# Patient Record
Sex: Female | Born: 1985 | Race: White | Hispanic: No | Marital: Married | Smoking: Never smoker
Health system: Southern US, Community
[De-identification: ages and names within clinical notes are randomized; demographics above are authoritative.]

## PROBLEM LIST (undated history)

## (undated) ENCOUNTER — Inpatient Hospital Stay (HOSPITAL_COMMUNITY): Payer: Self-pay

## (undated) DIAGNOSIS — N809 Endometriosis, unspecified: Secondary | ICD-10-CM

## (undated) DIAGNOSIS — N83209 Unspecified ovarian cyst, unspecified side: Secondary | ICD-10-CM

## (undated) DIAGNOSIS — O24419 Gestational diabetes mellitus in pregnancy, unspecified control: Secondary | ICD-10-CM

## (undated) DIAGNOSIS — F32A Depression, unspecified: Secondary | ICD-10-CM

## (undated) DIAGNOSIS — F329 Major depressive disorder, single episode, unspecified: Secondary | ICD-10-CM

## (undated) DIAGNOSIS — Z8632 Personal history of gestational diabetes: Secondary | ICD-10-CM

## (undated) HISTORY — DX: Gestational diabetes mellitus in pregnancy, unspecified control: O24.419

## (undated) HISTORY — PX: OVARIAN CYST SURGERY: SHX726

## (undated) HISTORY — DX: Unspecified ovarian cyst, unspecified side: N83.209

## (undated) HISTORY — PX: WISDOM TOOTH EXTRACTION: SHX21

---

## 2013-08-03 DIAGNOSIS — M797 Fibromyalgia: Secondary | ICD-10-CM | POA: Insufficient documentation

## 2014-09-15 DIAGNOSIS — F909 Attention-deficit hyperactivity disorder, unspecified type: Secondary | ICD-10-CM | POA: Insufficient documentation

## 2014-09-15 DIAGNOSIS — N979 Female infertility, unspecified: Secondary | ICD-10-CM | POA: Insufficient documentation

## 2017-05-02 ENCOUNTER — Encounter (HOSPITAL_COMMUNITY): Payer: Self-pay | Admitting: *Deleted

## 2017-05-02 ENCOUNTER — Ambulatory Visit (HOSPITAL_COMMUNITY)
Admission: EM | Admit: 2017-05-02 | Discharge: 2017-05-02 | Disposition: A | Discharge status: Home / Self Care | Payer: Medicaid Other | Attending: Family Medicine | Admitting: Family Medicine

## 2017-05-02 DIAGNOSIS — B3731 Acute candidiasis of vulva and vagina: Secondary | ICD-10-CM

## 2017-05-02 DIAGNOSIS — L298 Other pruritus: Secondary | ICD-10-CM | POA: Diagnosis present

## 2017-05-02 DIAGNOSIS — Z79899 Other long term (current) drug therapy: Secondary | ICD-10-CM | POA: Insufficient documentation

## 2017-05-02 DIAGNOSIS — B373 Candidiasis of vulva and vagina: Secondary | ICD-10-CM

## 2017-05-02 MED ORDER — FLUCONAZOLE 150 MG PO TABS: 150.0000 mg | ORAL_TABLET | Freq: Once | ORAL | 0 refills | Status: AC

## 2017-05-02 NOTE — ED Triage Notes (Signed)
Possible  Yeast  Infection   Irritation   And   Discharge   Symptoms      X   3  Days

## 2017-05-02 NOTE — ED Provider Notes (Addendum)
[place]    CSN: [id number] Arrival date & time: 05/02/17  1422     History   Chief Complaint Chief Complaint  Patient presents with  . Vaginal Itching    HPI Tracy Gallegos is a 31 y.o. female.   Possible  Yeast  Infection   Irritation   And   Discharge   Symptoms      X   3  Days .  She is married and has one child. She describes the discharge as white lumpy and associated with large amount of itchiness. She tried some of her daughter's diaper rash cream which did not help.      History reviewed. No pertinent past medical history.  There are no active problems to display for this patient.   Past Surgical History:  Procedure Laterality Date  . OVARIAN CYST SURGERY    . WISDOM TOOTH EXTRACTION      OB History    No data available       Home Medications    Prior to Admission medications   Medication Sig Start Date End Date Taking? Authorizing Provider  amphetamine-dextroamphetamine (ADDERALL) 5 MG tablet Take 5 mg by mouth daily.   Yes [provider]  FLUoxetine (PROZAC) 40 MG capsule Take 40 mg by mouth daily.   Yes [provider]  Prenatal Vit-Fe Fumarate-FA (PRENATAL MULTIVITAMIN) TABS tablet Take 1 tablet by mouth daily at 12 noon.   Yes [provider]  fluconazole (DIFLUCAN) 150 MG tablet Take 1 tablet (150 mg total) by mouth once. Repeat if needed 05/02/17 05/02/17  Robyn Haber, MD    Family History History reviewed. No pertinent family history.  Social History Social History  Substance Use Topics  . Smoking status: Never Smoker  . Smokeless tobacco: Never Used  . Alcohol use No     Allergies   Patient has no known allergies.   Review of Systems Review of Systems  Genitourinary: Positive for vaginal discharge.  All other systems reviewed and are negative.    Physical Exam Triage Vital Signs ED Triage Vitals [05/02/17 1440]  Enc Vitals Group     BP 101/67     Pulse Rate 76     Resp 18       Temp 98.9 F (37.2 C)     Temp Source Oral     SpO2 100 %     Weight      Height      Head Circumference      Peak Flow      Pain Score      Pain Loc      Pain Edu?      Excl. in August?    No data found.   Updated Vital Signs BP 101/67 (BP Location: Right Arm)   Pulse 76   Temp 98.9 F (37.2 C) (Oral)   Resp 18   LMP 04/16/2017 Comment: breast  feeding  SpO2 100%    Physical Exam  Constitutional: She is oriented to person, place, and time. She appears well-developed and well-nourished.  HENT:  Right Ear: External ear normal.  Left Ear: External ear normal.  Eyes: Conjunctivae are normal. Pupils are equal, round, and reactive to light.  Neck: Normal range of motion. Neck supple.  Pulmonary/Chest: Effort normal.  Musculoskeletal: Normal range of motion.  Neurological: She is alert and oriented to person, place, and time.  Skin: Skin is warm and dry.  Nursing note and vitals reviewed.  UC Treatments / Results  Labs (all labs ordered are listed, but only abnormal results are displayed) Labs Reviewed  URINE CYTOLOGY ANCILLARY ONLY    EKG  EKG Interpretation None       Radiology No results found.  Procedures Procedures (including critical care time)  Medications Ordered in UC Medications - No data to display   Initial Impression / Assessment and Plan / UC Course  I have reviewed the triage vital signs and the nursing notes.  Pertinent labs & imaging results that were available during my care of the patient were reviewed by me and considered in my medical decision making (see chart for details).     Final Clinical Impressions(s) / UC Diagnoses   Final diagnoses:  Monilial vulvovaginitis    New Prescriptions New Prescriptions   FLUCONAZOLE (DIFLUCAN) 150 MG TABLET    Take 1 tablet (150 mg total) by mouth once. Repeat if needed     Robyn Haber, MD 05/02/17 [place], Rejina Odle, MD 05/02/17 1505

## 2017-05-05 LAB — URINE CYTOLOGY ANCILLARY ONLY
Chlamydia: NEGATIVE
Neisseria Gonorrhea: NEGATIVE
Trichomonas: NEGATIVE

## 2017-05-07 LAB — URINE CYTOLOGY ANCILLARY ONLY
Bacterial vaginitis: NEGATIVE
Candida vaginitis: NEGATIVE

## 2017-08-27 ENCOUNTER — Ambulatory Visit (INDEPENDENT_AMBULATORY_CARE_PROVIDER_SITE_OTHER): Payer: 59

## 2017-08-27 DIAGNOSIS — N912 Amenorrhea, unspecified: Secondary | ICD-10-CM

## 2017-08-27 DIAGNOSIS — Z3201 Encounter for pregnancy test, result positive: Secondary | ICD-10-CM | POA: Diagnosis not present

## 2017-08-27 LAB — POCT URINE PREGNANCY: PREG TEST UR: POSITIVE — AB

## 2017-08-27 NOTE — Progress Notes (Addendum)
Patient is in the office for UPT, results positive. LMP 07-03-17, EDD 04-09-18, [place] [redacted]w[redacted]d.    I have reviewed this chart and agree with the RN assessment and management.    Feliz Beam, M.D. Attending [place], [facility] for [employer], [place]

## 2017-08-27 NOTE — Progress Notes (Signed)
Pt here for a pregnancy test.

## 2017-08-29 ENCOUNTER — Encounter (HOSPITAL_COMMUNITY): Payer: Self-pay

## 2017-08-29 ENCOUNTER — Inpatient Hospital Stay (HOSPITAL_COMMUNITY): Payer: Medicaid Other

## 2017-08-29 ENCOUNTER — Inpatient Hospital Stay (HOSPITAL_COMMUNITY)
Admission: AD | Admit: 2017-08-29 | Discharge: 2017-08-29 | Disposition: A | Discharge status: Home / Self Care | Payer: Medicaid Other | Source: Ambulatory Visit | Attending: Obstetrics and Gynecology | Admitting: Obstetrics and Gynecology

## 2017-08-29 DIAGNOSIS — O210 Mild hyperemesis gravidarum: Secondary | ICD-10-CM | POA: Diagnosis not present

## 2017-08-29 DIAGNOSIS — Z6791 Unspecified blood type, Rh negative: Secondary | ICD-10-CM | POA: Insufficient documentation

## 2017-08-29 DIAGNOSIS — O468X1 Other antepartum hemorrhage, first trimester: Secondary | ICD-10-CM | POA: Diagnosis not present

## 2017-08-29 DIAGNOSIS — O209 Hemorrhage in early pregnancy, unspecified: Secondary | ICD-10-CM

## 2017-08-29 DIAGNOSIS — O26891 Other specified pregnancy related conditions, first trimester: Secondary | ICD-10-CM | POA: Diagnosis not present

## 2017-08-29 DIAGNOSIS — O219 Vomiting of pregnancy, unspecified: Secondary | ICD-10-CM

## 2017-08-29 DIAGNOSIS — O418X10 Other specified disorders of amniotic fluid and membranes, first trimester, not applicable or unspecified: Secondary | ICD-10-CM

## 2017-08-29 DIAGNOSIS — Z3A08 8 weeks gestation of pregnancy: Secondary | ICD-10-CM | POA: Diagnosis not present

## 2017-08-29 DIAGNOSIS — O360120 Maternal care for anti-D [Rh] antibodies, second trimester, not applicable or unspecified: Secondary | ICD-10-CM | POA: Diagnosis not present

## 2017-08-29 DIAGNOSIS — O208 Other hemorrhage in early pregnancy: Secondary | ICD-10-CM | POA: Diagnosis not present

## 2017-08-29 HISTORY — DX: Major depressive disorder, single episode, unspecified: F32.9

## 2017-08-29 HISTORY — DX: Endometriosis, unspecified: N80.9

## 2017-08-29 HISTORY — DX: Depression, unspecified: F32.A

## 2017-08-29 LAB — CBC
HCT: 36.7 % (ref 36.0–46.0)
Hemoglobin: 12.6 g/dL (ref 12.0–15.0)
MCH: 31.1 pg (ref 26.0–34.0)
MCHC: 34.3 g/dL (ref 30.0–36.0)
MCV: 90.6 fL (ref 78.0–100.0)
PLATELETS: 165 10*3/uL (ref 150–400)
RBC: 4.05 MIL/uL (ref 3.87–5.11)
RDW: 12.9 % (ref 11.5–15.5)
WBC: 7.3 10*3/uL (ref 4.0–10.5)

## 2017-08-29 LAB — HCG, QUANTITATIVE, PREGNANCY: hCG, Beta Chain, Quant, S: 104020 m[IU]/mL — ABNORMAL HIGH (ref <5)

## 2017-08-29 LAB — ABO/RH: ABO/RH(D): O NEG

## 2017-08-29 MED ORDER — ONDANSETRON 4 MG PO TBDP: 4.0000 mg | ORAL_TABLET | Freq: Three times a day (TID) | ORAL | 0 refills | Status: DC | PRN

## 2017-08-29 MED ORDER — RHO D IMMUNE GLOBULIN 1500 UNIT/2ML IJ SOSY
300.0000 ug | PREFILLED_SYRINGE | Freq: Once | INTRAMUSCULAR | Status: AC
  Administered 2017-08-29: 300 ug via INTRAMUSCULAR
  Filled 2017-08-29: qty 2

## 2017-08-29 MED ORDER — METOCLOPRAMIDE HCL 10 MG PO TABS: 10.0000 mg | ORAL_TABLET | Freq: Three times a day (TID) | ORAL | 0 refills | Status: DC

## 2017-08-29 NOTE — MAU Note (Signed)
Pt started bleeding about 2 hours about. Large amounts of red blood, no clots or tissue. Pt is bleeding heavy and it has run on the floor and on the bed while she was changing

## 2017-08-29 NOTE — MAU Provider Note (Signed)
History     CSN: [id number]  Arrival date and time: 08/29/17 1123   First Provider Initiated Contact with Patient 08/29/17 1137      Chief Complaint  Patient presents with  . Vaginal Bleeding  . Abdominal Pain   HPI   Ms.Tracy Gallegos is a 31 y.o. female G2P1001 @ [redacted]w[redacted]d here in MAU with complaints of acute onset of bright red vaginal bleeding. States she started bleeding heavily 2 hours prior to arrival to MAU. States she is also having abdominal cramping that started around the same time as the bleeding. She has not taken anything for the pain and declines needing anything for the pain at this time.   OB History    Gravida Para Term Preterm AB Living   2 1 1     1    SAB TAB Ectopic Multiple Live Births           1      Past Medical History:  Diagnosis Date  . Depression   . Endometriosis     Past Surgical History:  Procedure Laterality Date  . OVARIAN CYST SURGERY    . WISDOM TOOTH EXTRACTION      History reviewed. No pertinent family history.  Social History  Substance Use Topics  . Smoking status: Never Smoker  . Smokeless tobacco: Never Used  . Alcohol use No    Allergies: No Known Allergies  Prescriptions Prior to Admission  Medication Sig Dispense Refill Last Dose  . amphetamine-dextroamphetamine (ADDERALL) 5 MG tablet Take 5 mg by mouth daily.   Not Taking  . Doxylamine-Pyridoxine 10-10 MG TBEC Take by mouth.   Taking  . FLUoxetine (PROZAC) 40 MG capsule Take 40 mg by mouth daily.   Taking  . Prenatal Vit-Fe Fumarate-FA (PRENATAL MULTIVITAMIN) TABS tablet Take 1 tablet by mouth daily at 12 noon.   Taking   Results for orders placed or performed during the hospital encounter of 08/29/17 (from the past 48 hour(s))  CBC     Status: None   Collection Time: 08/29/17 11:44 AM  Result Value Ref Range   WBC 7.3 4.0 - 10.5 K/uL   RBC 4.05 3.87 - 5.11 MIL/uL   Hemoglobin 12.6 12.0 - 15.0 g/dL   HCT 36.7 36.0 - 46.0 %   MCV 90.6 78.0 - 100.0 fL   MCH  31.1 26.0 - 34.0 pg   MCHC 34.3 30.0 - 36.0 g/dL   RDW 12.9 11.5 - 15.5 %   Platelets 165 150 - 400 K/uL  ABO/Rh     Status: None   Collection Time: 08/29/17 11:44 AM  Result Value Ref Range   ABO/RH(D) O NEG   hCG, quantitative, pregnancy     Status: Abnormal   Collection Time: 08/29/17 11:44 AM  Result Value Ref Range   hCG, Beta Chain, Quant, S 104,020 (H) <5 mIU/mL    Comment:          GEST. AGE      CONC.  (mIU/mL)   <=1 WEEK        5 - 50     2 WEEKS       50 - 500     3 WEEKS       100 - 10,000     4 WEEKS     1,000 - 30,000     5 WEEKS     3,500 - 115,000   6-8 WEEKS     12,000 - 270,000    12 WEEKS  15,000 - 220,000        FEMALE AND NON-PREGNANT FEMALE:     LESS THAN 5 mIU/mL   Rh IG workup (includes ABO/Rh)     Status: None (Preliminary result)   Collection Time: 08/29/17 11:44 AM  Result Value Ref Range   Gestational Age(Wks) 8    ABO/RH(D) O NEG    Antibody Screen NEG    Unit Number [id number]    Blood Component Type RHIG    Unit division 00    Status of Unit ISSUED    Transfusion Status OK TO TRANSFUSE    US Ob Comp Less 14 Wks  Result Date: 08/29/2017 CLINICAL DATA:  Vaginal bleeding in first trimester pregnancy. Gestational age by LMP of 8 weeks 1 day. EXAM: OBSTETRIC <14 WK [place] AND TRANSVAGINAL OB US TECHNIQUE: Both transabdominal and transvaginal ultrasound examinations were performed for complete evaluation of the gestation as well as the maternal uterus, adnexal regions, and pelvic cul-de-sac. Transvaginal technique was performed to assess early pregnancy. COMPARISON:  None. FINDINGS: Intrauterine gestational sac: Single Yolk sac:  Visualized. Embryo:  Visualized. Cardiac Activity: Visualized. Heart Rate: 170  bpm CRL:  17  mm   8 w   0 d                  [place] EDC: 04/10/2018 Subchorionic hemorrhage:  Small subchorionic hemorrhage noted. Maternal uterus/adnexae: Retroverted uterus. 2.9 cm left ovarian corpus luteum cyst. Normal appearance of right ovary.  No abnormal free-fluid. IMPRESSION: Single living IUP measuring 8 weeks 0 days. This is concordant with LMP. Small subchorionic hemorrhage noted. Electronically Signed   By: Earle Gell M.D.   On: 08/29/2017 12:30   US Ob Transvaginal  Result Date: 08/29/2017 CLINICAL DATA:  Vaginal bleeding in first trimester pregnancy. Gestational age by LMP of 8 weeks 1 day. EXAM: OBSTETRIC <14 WK [place] AND TRANSVAGINAL OB US TECHNIQUE: Both transabdominal and transvaginal ultrasound examinations were performed for complete evaluation of the gestation as well as the maternal uterus, adnexal regions, and pelvic cul-de-sac. Transvaginal technique was performed to assess early pregnancy. COMPARISON:  None. FINDINGS: Intrauterine gestational sac: Single Yolk sac:  Visualized. Embryo:  Visualized. Cardiac Activity: Visualized. Heart Rate: 170  bpm CRL:  17  mm   8 w   0 d                  [place] EDC: 04/10/2018 Subchorionic hemorrhage:  Small subchorionic hemorrhage noted. Maternal uterus/adnexae: Retroverted uterus. 2.9 cm left ovarian corpus luteum cyst. Normal appearance of right ovary. No abnormal free-fluid. IMPRESSION: Single living IUP measuring 8 weeks 0 days. This is concordant with LMP. Small subchorionic hemorrhage noted. Electronically Signed   By: Earle Gell M.D.   On: 08/29/2017 12:30   Review of Systems  Constitutional: Negative for fever.  Gastrointestinal: Positive for abdominal pain.  Genitourinary: Positive for vaginal bleeding.   Physical Exam   Blood pressure (!) 93/53, pulse 71, temperature 98.2 F (36.8 C), temperature source Oral, resp. rate 16, last menstrual period 07/03/2017, SpO2 100 %.  Physical Exam  Constitutional: She is oriented to person, place, and time. She appears well-developed and well-nourished. No distress.  HENT:  Head: Normocephalic.  Eyes: Pupils are equal, round, and reactive to light.  GI: Normal appearance. There is generalized tenderness.  Genitourinary:  Genitourinary  Comments: Vagina - Small amount of dark red vaginal bleeding; difficult to assess active bleeding due to patients tolerance of exam Bimanual exam: Cervix firmly closed, anterior  Uterus non tender, enlarged uterus  Adnexa non tender, no masses bilaterally Chaperone present for exam.   Musculoskeletal: Normal range of motion.  Neurological: She is alert and oriented to person, place, and time.  Skin: Skin is warm. She is not diaphoretic.  Psychiatric: Her behavior is normal.   MAU Course  Procedures  None  MDM  [place] ordered bedside O negative blood type: Rhogam given CBC & ABO ordered   Assessment and Plan   A:  1. Vaginal bleeding in pregnancy, first trimester   2. Subchorionic hematoma in first trimester, single or unspecified fetus   3. Nausea and vomiting in pregnancy   4. Rh negative status during pregnancy in first trimester     P:  Discharge home with strict return precautions Bleeding precautions Pelvic rest Rx: Zofran, Reglan    Selina Tapper, Artist Pais, NP 08/29/2017 1:53 PM

## 2017-08-29 NOTE — Discharge Instructions (Signed)
Subchorionic Hematoma °A subchorionic hematoma is a gathering of blood between the outer wall of the placenta and the inner wall of the womb (uterus). The placenta is the organ that connects the fetus to the wall of the uterus. The placenta performs the feeding, breathing (oxygen to the fetus), and waste removal (excretory work) of the fetus. °Subchorionic hematoma is the most common abnormality found on a result from ultrasonography done during the first trimester or early second trimester of pregnancy. If there has been little or no vaginal bleeding, early small hematomas usually shrink on their own and do not affect your baby or pregnancy. The blood is gradually absorbed over 1-2 weeks. When bleeding starts later in pregnancy or the hematoma is larger or occurs in an older pregnant woman, the outcome may not be as good. Larger hematomas may get bigger, which increases the chances for miscarriage. Subchorionic hematoma also increases the risk of premature detachment of the placenta from the uterus, preterm (premature) labor, and stillbirth. °Follow these instructions at home: °· Stay on bed rest if your health care provider recommends this. Although bed rest will not prevent more bleeding or prevent a miscarriage, your health care provider may recommend bed rest until you are advised otherwise. °· Avoid heavy lifting (more than 10 lb [4.5 kg]), exercise, sexual intercourse, or douching as directed by your health care provider. °· Keep track of the number of pads you use each day and how soaked (saturated) they are. Write down this information. °· Do not use tampons. °· Keep all follow-up appointments as directed by your health care provider. Your health care provider may ask you to have follow-up blood tests or ultrasound tests or both. °Get help right away if: °· You have severe cramps in your stomach, back, abdomen, or pelvis. °· You have a fever. °· You pass large clots or tissue. Save any tissue for your  health care provider to look at. °· Your bleeding increases or you become lightheaded, feel weak, or have fainting episodes. °This information is not intended to replace advice given to you by your health care provider. Make sure you discuss any questions you have with your health care provider. °Document Released: 02/12/2007 Document Revised: 04/04/2016 Document Reviewed: 05/27/2013 °Elsevier Interactive Patient Education © 2017 Elsevier Inc. ° °

## 2017-08-30 LAB — RH IG WORKUP (INCLUDES ABO/RH)
ABO/RH(D): O NEG
Antibody Screen: NEGATIVE
GESTATIONAL AGE(WKS): 8
UNIT DIVISION: 0

## 2017-09-18 ENCOUNTER — Ambulatory Visit (INDEPENDENT_AMBULATORY_CARE_PROVIDER_SITE_OTHER): Payer: Medicaid Other | Admitting: Certified Nurse Midwife

## 2017-09-18 ENCOUNTER — Encounter: Payer: Self-pay | Admitting: Certified Nurse Midwife

## 2017-09-18 ENCOUNTER — Other Ambulatory Visit (HOSPITAL_COMMUNITY)
Admission: RE | Admit: 2017-09-18 | Discharge: 2017-09-18 | Disposition: A | Discharge status: Home / Self Care | Payer: Medicaid Other | Source: Ambulatory Visit | Attending: Certified Nurse Midwife | Admitting: Certified Nurse Midwife

## 2017-09-18 VITALS — BP 105/64 | HR 79 | Ht 68.0 in | Wt 129.0 lb

## 2017-09-18 DIAGNOSIS — Z3481 Encounter for supervision of other normal pregnancy, first trimester: Secondary | ICD-10-CM | POA: Diagnosis not present

## 2017-09-18 DIAGNOSIS — O219 Vomiting of pregnancy, unspecified: Secondary | ICD-10-CM | POA: Insufficient documentation

## 2017-09-18 DIAGNOSIS — Z23 Encounter for immunization: Secondary | ICD-10-CM | POA: Diagnosis not present

## 2017-09-18 DIAGNOSIS — Z3A11 11 weeks gestation of pregnancy: Secondary | ICD-10-CM | POA: Diagnosis not present

## 2017-09-18 DIAGNOSIS — O0990 Supervision of high risk pregnancy, unspecified, unspecified trimester: Secondary | ICD-10-CM | POA: Insufficient documentation

## 2017-09-18 MED ORDER — DOXYLAMINE-PYRIDOXINE 10-10 MG PO TBEC: DELAYED_RELEASE_TABLET | ORAL | 4 refills | Status: DC

## 2017-09-18 NOTE — Progress Notes (Signed)
Pt c/o vaginal bleeding-has improved since seen at [facility]. She reports pink tint on paper after bathroom

## 2017-09-19 ENCOUNTER — Telehealth: Payer: Self-pay | Admitting: Pediatrics

## 2017-09-19 ENCOUNTER — Inpatient Hospital Stay (HOSPITAL_COMMUNITY)
Admission: AD | Admit: 2017-09-19 | Discharge: 2017-09-19 | Disposition: A | Discharge status: Home / Self Care | Payer: Medicaid Other | Source: Ambulatory Visit | Attending: Obstetrics & Gynecology | Admitting: Obstetrics & Gynecology

## 2017-09-19 ENCOUNTER — Encounter (HOSPITAL_COMMUNITY): Payer: Self-pay

## 2017-09-19 DIAGNOSIS — Z3A11 11 weeks gestation of pregnancy: Secondary | ICD-10-CM | POA: Diagnosis not present

## 2017-09-19 DIAGNOSIS — R109 Unspecified abdominal pain: Secondary | ICD-10-CM

## 2017-09-19 DIAGNOSIS — O26891 Other specified pregnancy related conditions, first trimester: Secondary | ICD-10-CM | POA: Diagnosis not present

## 2017-09-19 DIAGNOSIS — O209 Hemorrhage in early pregnancy, unspecified: Secondary | ICD-10-CM

## 2017-09-19 DIAGNOSIS — O468X1 Other antepartum hemorrhage, first trimester: Secondary | ICD-10-CM | POA: Insufficient documentation

## 2017-09-19 LAB — URINALYSIS, ROUTINE W REFLEX MICROSCOPIC
Bilirubin Urine: NEGATIVE
GLUCOSE, UA: NEGATIVE mg/dL
Ketones, ur: NEGATIVE mg/dL
NITRITE: NEGATIVE
PH: 5 (ref 5.0–8.0)
PROTEIN: NEGATIVE mg/dL
Specific Gravity, Urine: 1.025 (ref 1.005–1.030)

## 2017-09-19 LAB — CERVICOVAGINAL ANCILLARY ONLY
Bacterial vaginitis: NEGATIVE
Candida vaginitis: NEGATIVE
Chlamydia: NEGATIVE
Neisseria Gonorrhea: NEGATIVE
Trichomonas: NEGATIVE

## 2017-09-19 NOTE — MAU Note (Signed)
I am bleeding a lot. 3-4wks ago told had subchorionic hemorrhage. Had some cramping today. Tonight was cooking dinner and had clear fld fun down legs and then started having bright red bleeding and now dark brown blood.

## 2017-09-19 NOTE — MAU Provider Note (Signed)
History     CSN: [id number]  Arrival date and time: 09/19/17 2034   First Provider Initiated Contact with Patient 09/19/17 2218      Chief Complaint  Patient presents with  . Vaginal Bleeding   HPI [name] is a 31 y.o. G2P1001 at [redacted]w[redacted]d who presents to MAU today with complaint of a bright red gush of blood. She states since then bleeding has become much lighter and is now brown. She has mild cramping that has improved since onset earlier today with bleeding. She has not taken anything for pain.   OB History    Gravida Para Term Preterm AB Living   2 1 1     1    SAB TAB Ectopic Multiple Live Births           1      Past Medical History:  Diagnosis Date  . Depression   . Endometriosis   . Ovarian cyst    Bilateral    Past Surgical History:  Procedure Laterality Date  . OVARIAN CYST SURGERY    . WISDOM TOOTH EXTRACTION      Family History  Problem Relation Age of Onset  . Hypertension Mother   . Obesity Mother   . Depression Mother   . Alcohol abuse Mother   . Heart disease Father   . Early death Father   . Obesity Brother   . Alcohol abuse Maternal Uncle   . Depression Maternal Uncle   . Hypertension Maternal Grandmother   . Depression Maternal Grandmother   . Alcohol abuse Maternal Grandmother   . Alzheimer's disease Maternal Grandmother   . Depression Maternal Grandfather   . Stroke Maternal Grandfather   . Heart disease Maternal Grandfather   . Suicidality Maternal Grandfather   . Suicidality Paternal Grandmother   . Depression Paternal Grandmother   . Hearing loss Paternal Grandfather     Social History   Tobacco Use  . Smoking status: Never Smoker  . Smokeless tobacco: Never Used  Substance Use Topics  . Alcohol use: No  . Drug use: No    Allergies:  Allergies  Allergen Reactions  . Nitrous Oxide Nausea And Vomiting    No medications prior to admission.    Review of Systems  Constitutional: Negative for fever.   Gastrointestinal: Negative for abdominal pain, constipation, diarrhea, nausea and vomiting.  Genitourinary: Positive for pelvic pain, vaginal bleeding and vaginal discharge.   Physical Exam   Blood pressure 99/65, pulse 100, temperature 98.3 F (36.8 C), temperature source Oral, resp. rate 15, height 5\' 8"  (1.727 m), weight 129 lb (58.5 kg), last menstrual period 07/03/2017.  Physical Exam  Nursing note and vitals reviewed. Constitutional: She is oriented to person, place, and time. She appears well-developed and well-nourished. No distress.  HENT:  Head: Normocephalic and atraumatic.  Cardiovascular: Normal rate.  Respiratory: Effort normal.  GI: Soft. She exhibits no distension and no mass. There is no tenderness. There is no rebound and no guarding.  Genitourinary: Uterus is enlarged (appropriate for GA). Uterus is not tender. Cervix exhibits no motion tenderness, no discharge and no friability. There is bleeding (small, light brown) in the vagina. No vaginal discharge found.  Neurological: She is alert and oriented to person, place, and time.  Skin: Skin is warm and dry. No erythema.  Psychiatric: She has a normal mood and affect.    Results for orders placed or performed during the hospital encounter of 09/19/17 (from the past 24 hour(s))  Urinalysis, Routine w reflex microscopic     Status: Abnormal   Collection Time: 09/19/17  8:55 PM  Result Value Ref Range   Color, Urine YELLOW YELLOW   APPearance CLEAR CLEAR   Specific Gravity, Urine 1.025 1.005 - 1.030   pH 5.0 5.0 - 8.0   Glucose, UA NEGATIVE NEGATIVE mg/dL   Hgb urine dipstick LARGE (A) NEGATIVE   Bilirubin Urine NEGATIVE NEGATIVE   Ketones, ur NEGATIVE NEGATIVE mg/dL   Protein, ur NEGATIVE NEGATIVE mg/dL   Nitrite NEGATIVE NEGATIVE   Leukocytes, UA TRACE (A) NEGATIVE   RBC / HPF 6-30 0 - 5 RBC/hpf   WBC, UA 0-5 0 - 5 WBC/hpf   Bacteria, UA RARE (A) NONE SEEN   Squamous Epithelial / LPF 0-5 (A) NONE SEEN    Mucus PRESENT     MAU Course  Procedures Pt informed that the ultrasound is considered a limited OB ultrasound and is not intended to be a complete ultrasound exam.  Patient also informed that the ultrasound is not being completed with the intent of assessing for fetal or placental anomalies or any pelvic abnormalities.  Explained that the purpose of today's ultrasound is to assess for  viability.  Patient acknowledges the purpose of the exam and the limitations of the study.   +FM and +FHR noted  MDM Reviewed last [place] - small [facility] noted  Assessment and Plan  A: SIUP at [redacted]w[redacted]d Small subchorionic hemorrhage Vaginal bleeding Abdominal pain in pregnancy  P: Discharge home Tylenol PRN advised  Bleeding precautions and pelvic rest discussed Patient advised to follow-up with [facility] as scheduled for routine prenatal care or sooner PRN Patient may return to MAU as needed or if her condition were to change or worsen  Kerry Hough, PA-C 09/19/2017, 11:15 PM

## 2017-09-19 NOTE — Telephone Encounter (Signed)
[facility] required PA for Diclegis.  PA approved# [id number]  Rx filled at pharmacy

## 2017-09-19 NOTE — Discharge Instructions (Signed)
Pelvic Rest °Pelvic rest may be recommended if: °· Your placenta is partially or completely covering the opening of your cervix (placenta previa). °· There is bleeding between the wall of the uterus and the amniotic sac in the first trimester of pregnancy (subchorionic hemorrhage). °· You went into labor too early (preterm labor). ° °Based on your overall health and the health of your baby, your health care provider will decide if pelvic rest is right for you. °How do I rest my pelvis? °For as long as told by your health care provider: °· Do not have sex, sexual stimulation, or an orgasm. °· Do not use tampons. Do not douche. Do not put anything in your vagina. °· Do not lift anything that is heavier than 10 lb (4.5 kg). °· Avoid activities that take a lot of effort (are strenuous). °· Avoid any activity in which your pelvic muscles could become strained. ° °When should I seek medical care? °Seek medical care if you have: °· Cramping pain in your lower abdomen. °· Vaginal discharge. °· A low, dull backache. °· Regular contractions. °· Uterine tightening. ° °When should I seek immediate medical care? °Seek immediate medical care if: °· You have vaginal bleeding and you are pregnant. ° °This information is not intended to replace advice given to you by your health care provider. Make sure you discuss any questions you have with your health care provider. °Document Released: 02/22/2011 Document Revised: 04/04/2016 Document Reviewed: 05/01/2015 °Elsevier Interactive Patient Education © 2018 Elsevier Inc. ° °Subchorionic Hematoma °A subchorionic hematoma is a gathering of blood between the outer wall of the placenta and the inner wall of the womb (uterus). The placenta is the organ that connects the fetus to the wall of the uterus. The placenta performs the feeding, breathing (oxygen to the fetus), and waste removal (excretory work) of the fetus. °Subchorionic hematoma is the most common abnormality found on a result  from ultrasonography done during the first trimester or early second trimester of pregnancy. If there has been little or no vaginal bleeding, early small hematomas usually shrink on their own and do not affect your baby or pregnancy. The blood is gradually absorbed over 1-2 weeks. When bleeding starts later in pregnancy or the hematoma is larger or occurs in an older pregnant woman, the outcome may not be as good. Larger hematomas may get bigger, which increases the chances for miscarriage. Subchorionic hematoma also increases the risk of premature detachment of the placenta from the uterus, preterm (premature) labor, and stillbirth. °Follow these instructions at home: °· Stay on bed rest if your health care provider recommends this. Although bed rest will not prevent more bleeding or prevent a miscarriage, your health care provider may recommend bed rest until you are advised otherwise. °· Avoid heavy lifting (more than 10 lb [4.5 kg]), exercise, sexual intercourse, or douching as directed by your health care provider. °· Keep track of the number of pads you use each day and how soaked (saturated) they are. Write down this information. °· Do not use tampons. °· Keep all follow-up appointments as directed by your health care provider. Your health care provider may ask you to have follow-up blood tests or ultrasound tests or both. °Get help right away if: °· You have severe cramps in your stomach, back, abdomen, or pelvis. °· You have a fever. °· You pass large clots or tissue. Save any tissue for your health care provider to look at. °· Your bleeding increases or you become lightheaded,   feel weak, or have fainting episodes. °This information is not intended to replace advice given to you by your health care provider. Make sure you discuss any questions you have with your health care provider. °Document Released: 02/12/2007 Document Revised: 04/04/2016 Document Reviewed: 05/27/2013 °Elsevier Interactive Patient  Education © 2017 Elsevier Inc. ° °

## 2017-09-20 LAB — URINE CULTURE, OB REFLEX: Organism ID, Bacteria: NO GROWTH

## 2017-09-20 LAB — CULTURE, OB URINE

## 2017-09-22 LAB — CYTOLOGY - PAP
Adequacy: ABSENT
Diagnosis: NEGATIVE

## 2017-09-22 NOTE — Progress Notes (Signed)
Subjective:    Tracy Gallegos is being seen today for her first obstetrical visit.  This is a planned pregnancy. She is at [redacted]w[redacted]d gestation. Her obstetrical history is significant for none. Relationship with FOB: spouse, living together. Patient does intend to breast feed. Pregnancy history fully reviewed.  The information documented in the HPI was reviewed and verified.  Menstrual History: OB History    Gravida Para Term Preterm AB Living   2 1 1     1    SAB TAB Ectopic Multiple Live Births           1     Patient's last menstrual period was 07/03/2017.    Past Medical History:  Diagnosis Date  . Depression   . Endometriosis   . Ovarian cyst    Bilateral    Past Surgical History:  Procedure Laterality Date  . OVARIAN CYST SURGERY    . WISDOM TOOTH EXTRACTION       (Not in a hospital admission) Allergies  Allergen Reactions  . Nitrous Oxide Nausea And Vomiting    Social History   Tobacco Use  . Smoking status: Never Smoker  . Smokeless tobacco: Never Used  Substance Use Topics  . Alcohol use: No    Family History  Problem Relation Age of Onset  . Hypertension Mother   . Obesity Mother   . Depression Mother   . Alcohol abuse Mother   . Heart disease Father   . Early death Father   . Obesity Brother   . Alcohol abuse Maternal Uncle   . Depression Maternal Uncle   . Hypertension Maternal Grandmother   . Depression Maternal Grandmother   . Alcohol abuse Maternal Grandmother   . Alzheimer's disease Maternal Grandmother   . Depression Maternal Grandfather   . Stroke Maternal Grandfather   . Heart disease Maternal Grandfather   . Suicidality Maternal Grandfather   . Suicidality Paternal Grandmother   . Depression Paternal Grandmother   . Hearing loss Paternal Grandfather      Review of Systems Constitutional: negative for weight loss Gastrointestinal: negative for vomiting Genitourinary:negative for genital lesions and vaginal discharge and  dysuria Musculoskeletal:negative for back pain Behavioral/Psych: negative for abusive relationship, depression, illegal drug usage and tobacco use    Objective:    BP 105/64   Pulse 79   Ht 5\' 8"  (1.727 m)   Wt 129 lb (58.5 kg)   LMP 07/03/2017 Comment: breast  feeding  BMI 19.61 kg/m  General Appearance:    Alert, cooperative, no distress, appears stated age  Head:    Normocephalic, without obvious abnormality, atraumatic  Eyes:    PERRL, conjunctiva/corneas clear, EOM's intact, fundi    benign, both eyes  Ears:    Normal TM's and external ear canals, both ears  Nose:   Nares normal, septum midline, mucosa normal, no drainage    or sinus tenderness  Throat:   Lips, mucosa, and tongue normal; teeth and gums normal  Neck:   Supple, symmetrical, trachea midline, no adenopathy;    thyroid:  no enlargement/tenderness/nodules; no carotid   bruit or JVD  Back:     Symmetric, no curvature, ROM normal, no CVA tenderness  Lungs:     Clear to auscultation bilaterally, respirations unlabored  Chest Wall:    No tenderness or deformity   Heart:    Regular rate and rhythm, S1 and S2 normal, no murmur, rub   or gallop  Breast Exam:    No tenderness, masses, or  nipple abnormality  Abdomen:     Soft, non-tender, bowel sounds active all four quadrants,    no masses, no organomegaly  Genitalia:    Normal female without lesion, discharge or tenderness  Extremities:   Extremities normal, atraumatic, no cyanosis or edema  Pulses:   2+ and symmetric all extremities  Skin:   Skin color, texture, turgor normal, no rashes or lesions  Lymph nodes:   Cervical, supraclavicular, and axillary nodes normal  Neurologic:   CNII-XII intact, normal strength, sensation and reflexes    throughout      Cervix: long, thick, closed and posterior.  FHR: 155 by doppler    Lab Review Urine pregnancy test Labs reviewed yes Radiologic studies reviewed yes Assessment:    Pregnancy at [redacted]w[redacted]d weeks    Plan:     1.  Supervision of normal intrauterine pregnancy in multigravida in first trimester    - Hemoglobinopathy evaluation - Varicella zoster antibody, IgG - Hemoglobin A1c - Obstetric Panel, Including HIV - MaterniT21 PLUS Core+SCA - Cystic Fibrosis Mutation 97 - Cervicovaginal ancillary only - Cytology - PAP - Culture, OB Urine  2. Nausea/vomiting in pregnancy    - Doxylamine-Pyridoxine (DICLEGIS) 10-10 MG TBEC; Take 1 tablet with breakfast and lunch.  Take 2 tablets at bedtime.  Dispense: 100 tablet; Refill: 4  3. Need for vaccination    - Flu Vaccine QUAD 36+ mos IM  Prenatal vitamins.  Counseling provided regarding continued use of seat belts, cessation of alcohol consumption, smoking or use of illicit drugs; infection precautions i.e., influenza/TDAP immunizations, toxoplasmosis,CMV, parvovirus, listeria and varicella; workplace safety, exercise during pregnancy; routine dental care, safe medications, sexual activity, hot tubs, saunas, pools, travel, caffeine use, fish and methlymercury, potential toxins, hair treatments, varicose veins Weight gain recommendations per IOM guidelines reviewed: underweight/BMI< 18.5--> gain 28 - 40 lbs; normal weight/BMI 18.5 - 24.9--> gain 25 - 35 lbs; overweight/BMI 25 - 29.9--> gain 15 - 25 lbs; obese/BMI >30->gain  11 - 20 lbs Problem list reviewed and updated. FIRST/CF mutation testing/NIPT/QUAD SCREEN/fragile X/Ashkenazi Jewish population testing/Spinal muscular atrophy discussed: ordered. Role of ultrasound in pregnancy discussed; fetal survey: ordered. Amniocentesis discussed: not indicated.  Meds ordered this encounter  Medications  . Doxylamine-Pyridoxine (DICLEGIS) 10-10 MG TBEC    Sig: Take 1 tablet with breakfast and lunch.  Take 2 tablets at bedtime.    Dispense:  100 tablet    Refill:  4   Orders Placed This Encounter  Procedures  . Culture, OB Urine  . Urine Culture, OB Reflex  . Flu Vaccine QUAD 36+ mos IM  . Hemoglobinopathy  evaluation  . Varicella zoster antibody, IgG  . Hemoglobin A1c  . Obstetric Panel, Including HIV  . MaterniT21 PLUS Core+SCA    Order Specific Question:   Is the patient insulin dependent?    Answer:   No    Order Specific Question:   Please enter gestational age. This should be expressed as weeks AND days, i.e. 16w 6d. Enter weeks here. Enter days in next question.    Answer:   68    Order Specific Question:   Please enter gestational age. This should be expressed as weeks AND days, i.e. 16w 6d. Enter days here. Enter weeks in previous question.    Answer:   0    Order Specific Question:   How was gestational age calculated?    Answer:   Ultrasound    Order Specific Question:   Please give the date of LMP OR Ultrasound  OR Estimated date of delivery.    Answer:   04/09/2018    Order Specific Question:   Number of Fetuses (Type of Pregnancy):    Answer:   1    Order Specific Question:   Indications for performing the test? (please choose all that apply):    Answer:   Routine screening    Order Specific Question:   Other Indications? (Y=Yes, N=No)    Answer:   N    Order Specific Question:   If this is a repeat specimen, please indicate the reason:    Answer:   Not indicated    Order Specific Question:   Please specify the patient's race: (C=White/Caucasion, B=Black, I=Native American, A=Asian, H=Hispanic, O=Other, U=Unknown)    Answer:   C    Order Specific Question:   Donor Egg - indicate if the egg was obtained from in vitro fertilization.    Answer:   N    Order Specific Question:   Age of Egg Donor.    Answer:   26    Order Specific Question:   Prior Down Syndrome/ONTD screening during current pregnancy.    Answer:   N    Order Specific Question:   Prior First Trimester Testing    Answer:   N    Order Specific Question:   Prior Second Trimester Testing    Answer:   N    Order Specific Question:   Family History of Neural Tube Defects    Answer:   N    Order Specific Question:    Prior Pregnancy with Down Syndrome    Answer:   N    Order Specific Question:   Please give the patient's weight (in pounds)    Answer:   129  . Cystic Fibrosis Mutation 97    Follow up in 4 weeks. 50% of 30 min visit spent on counseling and coordination of care.

## 2017-09-23 ENCOUNTER — Other Ambulatory Visit: Payer: Self-pay | Admitting: Certified Nurse Midwife

## 2017-09-23 DIAGNOSIS — Z3481 Encounter for supervision of other normal pregnancy, first trimester: Secondary | ICD-10-CM

## 2017-09-23 DIAGNOSIS — O360110 Maternal care for anti-D [Rh] antibodies, first trimester, not applicable or unspecified: Secondary | ICD-10-CM

## 2017-09-23 DIAGNOSIS — O360190 Maternal care for anti-D [Rh] antibodies, unspecified trimester, not applicable or unspecified: Secondary | ICD-10-CM | POA: Insufficient documentation

## 2017-09-23 LAB — OBSTETRIC PANEL, INCLUDING HIV
BASOS ABS: 0 10*3/uL (ref 0.0–0.2)
Basos: 0 %
EOS (ABSOLUTE): 0.1 10*3/uL (ref 0.0–0.4)
Eos: 1 %
HEP B S AG: NEGATIVE
HIV SCREEN 4TH GENERATION: NONREACTIVE
Hematocrit: 38.1 % (ref 34.0–46.6)
Hemoglobin: 12.5 g/dL (ref 11.1–15.9)
IMMATURE GRANS (ABS): 0 10*3/uL (ref 0.0–0.1)
Immature Granulocytes: 0 %
Lymphocytes Absolute: 3 10*3/uL (ref 0.7–3.1)
Lymphs: 29 %
MCH: 30.3 pg (ref 26.6–33.0)
MCHC: 32.8 g/dL (ref 31.5–35.7)
MCV: 93 fL (ref 79–97)
MONOCYTES: 4 %
MONOS ABS: 0.5 10*3/uL (ref 0.1–0.9)
NEUTROS ABS: 6.8 10*3/uL (ref 1.4–7.0)
NEUTROS PCT: 66 %
PLATELETS: 222 10*3/uL (ref 150–379)
RBC: 4.12 x10E6/uL (ref 3.77–5.28)
RDW: 13.6 % (ref 12.3–15.4)
RPR Ser Ql: NONREACTIVE
RUBELLA: 2.23 {index} (ref >0.99)
Rh Factor: NEGATIVE
WBC: 10.3 10*3/uL (ref 3.4–10.8)

## 2017-09-23 LAB — MATERNIT21 PLUS CORE+SCA
CHROMOSOME 13: NEGATIVE
CHROMOSOME 18: NEGATIVE
CHROMOSOME 21: NEGATIVE
Y Chromosome: DETECTED

## 2017-09-23 LAB — AB SCR+ANTIBODY ID: ANTIBODY SCREEN: POSITIVE — AB

## 2017-09-23 LAB — HEMOGLOBIN A1C
ESTIMATED AVERAGE GLUCOSE: 100 mg/dL
HEMOGLOBIN A1C: 5.1 % (ref 4.8–5.6)

## 2017-09-23 LAB — HEMOGLOBINOPATHY EVALUATION
HGB C: 0 %
HGB S: 0 %
HGB VARIANT: 0 %
Hemoglobin A2 Quantitation: 2.3 % (ref 1.8–3.2)
Hemoglobin F Quantitation: 0 % (ref 0.0–2.0)
Hgb A: 97.7 % (ref 96.4–98.8)

## 2017-09-23 LAB — VARICELLA ZOSTER ANTIBODY, IGG: VARICELLA: 2085 {index} (ref >165)

## 2017-09-25 ENCOUNTER — Other Ambulatory Visit: Payer: Self-pay | Admitting: Certified Nurse Midwife

## 2017-09-25 DIAGNOSIS — Z3481 Encounter for supervision of other normal pregnancy, first trimester: Secondary | ICD-10-CM

## 2017-09-25 DIAGNOSIS — Z6791 Unspecified blood type, Rh negative: Secondary | ICD-10-CM

## 2017-09-25 DIAGNOSIS — O26899 Other specified pregnancy related conditions, unspecified trimester: Secondary | ICD-10-CM | POA: Insufficient documentation

## 2017-09-27 LAB — CYSTIC FIBROSIS MUTATION 97

## 2017-09-29 ENCOUNTER — Other Ambulatory Visit: Payer: Self-pay | Admitting: Certified Nurse Midwife

## 2017-09-29 DIAGNOSIS — Z141 Cystic fibrosis carrier: Secondary | ICD-10-CM

## 2017-09-29 DIAGNOSIS — Z3481 Encounter for supervision of other normal pregnancy, first trimester: Secondary | ICD-10-CM

## 2017-10-08 ENCOUNTER — Other Ambulatory Visit: Payer: Self-pay

## 2017-10-08 ENCOUNTER — Ambulatory Visit: Payer: 59 | Admitting: Student

## 2017-10-08 ENCOUNTER — Encounter: Payer: Self-pay | Admitting: Student

## 2017-10-08 VITALS — BP 86/54 | HR 101 | Temp 98.1°F | Wt 130.8 lb

## 2017-10-08 DIAGNOSIS — R05 Cough: Secondary | ICD-10-CM

## 2017-10-08 DIAGNOSIS — R059 Cough, unspecified: Secondary | ICD-10-CM

## 2017-10-08 MED ORDER — GUAIFENESIN-CODEINE 100-10 MG/5ML PO SYRP: 5.0000 mL | ORAL_SOLUTION | Freq: Three times a day (TID) | ORAL | 0 refills | Status: DC | PRN

## 2017-10-08 NOTE — Progress Notes (Signed)
Subjective:    Tracy Gallegos is a 31 y.o. old female here for cough. Patient is [redacted]w[redacted]d pregnant  HPI Cough: Congestion and runny nose for about 10 days.  She lost her voice and developed sore throat and a lot of nasal drainage 4 days ago. Then she started coughing two days ago.  She reports coughing day and night continuously.  Now she has chest pain from coughing.  She sometimes coughs until she gags and throws up.  She tried Delsym, Tylenol and guaifenesin without improvement.  She was a little worried about guaifenesin and did not continue taking.  Denies vaginal bleeding or discharge.  She denies fever.  She is still tolerating oral fluids well.  PMH/Problem List: has Supervision of normal intrauterine pregnancy in multigravida in first trimester; Anti-D antibodies present during pregnancy; Rh negative state in antepartum period; and Cystic fibrosis carrier on their problem list.   has a past medical history of Depression, Endometriosis, and Ovarian cyst.  FH:  Family History  Problem Relation Age of Onset  . Hypertension Mother   . Obesity Mother   . Depression Mother   . Alcohol abuse Mother   . Heart disease Father   . Early death Father   . Obesity Brother   . Alcohol abuse Maternal Uncle   . Depression Maternal Uncle   . Hypertension Maternal Grandmother   . Depression Maternal Grandmother   . Alcohol abuse Maternal Grandmother   . Alzheimer's disease Maternal Grandmother   . Depression Maternal Grandfather   . Stroke Maternal Grandfather   . Heart disease Maternal Grandfather   . Suicidality Maternal Grandfather   . Suicidality Paternal Grandmother   . Depression Paternal Grandmother   . Hearing loss Paternal Grandfather     SH Social History   Tobacco Use  . Smoking status: Never Smoker  . Smokeless tobacco: Never Used  Substance Use Topics  . Alcohol use: No  . Drug use: No    Review of Systems Review of systems negative except for pertinent positives and  negatives in history of present illness above.     Objective:     Vitals:   10/08/17 1528  BP: (!) 86/54  Pulse: (!) 101  Temp: 98.1 F (36.7 C)  TempSrc: Oral  SpO2: 98%  Weight: 130 lb 12.8 oz (59.3 kg)   Body mass index is 19.89 kg/m.  Physical Exam  GEN: appears well, no apparent distress. Nares: Some erythema Oropharynx: mmm without erythema or exudation HEM: negative for cervical or periauricular lymphadenopathies CVS: Tachycardic to 96, RR, nl s1 & s2, no murmurs, no edema RESP: no IWOB, good air movement bilaterally, CTAB GI: BS present & normal, soft, NTND MSK: no focal tenderness or notable swelling NEURO: alert and oiented appropriately, no gross deficits  PSYCH: euthymic mood with congruent affect  FHR 155/min    Assessment and Plan:  1. Cough: Likely due to viral URI. 0/4 centers criteria to think of strep pharyngitis. Unlikely pneumonia. She was continuously coughing during this encounter and exam.  She is mildly tachycardic.  Noted that blood pressure is slightly low.  She is not symptomatic from this.  She is still tolerating oral intake. FHR is normal at 155/min. She says it was 171 at her OB office previously.  -Recommended abundant fluids especially warm fluids such as soup and tea as many times as she can -Recommended trying guaifenesin DM. I gave her prescription for Cheratussin AC if guaifenesin DM does not help -Recommended a tablespoonful of  honey before bedtime as well -Discussed return precautions including but not limited to shortness of breath or increased working of breathing, severe persistent cough, persistent fever over 101F, not tolerating fluids by mouth or other symptoms concerning to her  Return if symptoms worsen or fail to improve.  Mercy Riding, MD 10/08/17 Pager: [phone]

## 2017-10-08 NOTE — Patient Instructions (Signed)
It appears that you have a viral upper respiratory infection (Common Cold).  Symptoms typically peak at 3-4 days of illness and then gradually improve over 10-14 days. However, a cough may last 2-4 weeks.   -You may try guaifenesin DM or fill the prescription for Cheratussin [facility] we gave you.  - A tablespoonful of honey before bedtime is helpful for cough - Get plenty of rest and adequate hydration. - Consume warm fluids (soup or tea) to provide relief for a stuffy nose and to loosen phlegm. - For nasal stuffiness, try saline nasal spray or a Neti Pot. Eating warm liquids such as chicken soup or tea may also help with nasal congestion. - For sore throat pain relief: suck on throat lozenges, gargle with warm salt water (1/4 tsp. salt per 8 oz. of water); and eat soft, bland foods. - Eat a well-balanced diet. If you cannot, ensure you are getting enough nutrients by taking a daily multivitamin. - Avoid dairy products, as they can thicken phlegm. - Avoid alcohol, as it impairs your body's immune system.  CONTACT YOUR DOCTOR IF YOU EXPERIENCE ANY OF THE FOLLOWING: - High fever, chest pain, shortness of breath or  not able to keep down food or fluids.  - Cough that gets worse while other cold symptoms improve - Flare up of any chronic lung problem, such as asthma - Your symptoms persist longer than 2 weeks

## 2017-10-16 ENCOUNTER — Ambulatory Visit (INDEPENDENT_AMBULATORY_CARE_PROVIDER_SITE_OTHER): Payer: Medicaid Other | Admitting: Certified Nurse Midwife

## 2017-10-16 VITALS — BP 104/72 | HR 84 | Wt 130.0 lb

## 2017-10-16 DIAGNOSIS — F32A Depression, unspecified: Secondary | ICD-10-CM

## 2017-10-16 DIAGNOSIS — F329 Major depressive disorder, single episode, unspecified: Secondary | ICD-10-CM

## 2017-10-16 DIAGNOSIS — O99342 Other mental disorders complicating pregnancy, second trimester: Secondary | ICD-10-CM

## 2017-10-16 DIAGNOSIS — Z3482 Encounter for supervision of other normal pregnancy, second trimester: Secondary | ICD-10-CM

## 2017-10-16 MED ORDER — FLUOXETINE HCL 20 MG PO CAPS: 20.0000 mg | ORAL_CAPSULE | Freq: Every day | ORAL | 3 refills | Status: DC

## 2017-10-16 NOTE — Progress Notes (Signed)
   PRENATAL VISIT NOTE  Subjective:  Tracy Gallegos is a 31 y.o. G2P1001 at [redacted]w[redacted]d being seen today for ongoing prenatal care.  She is currently monitored for the following issues for this low-risk pregnancy and has Supervision of normal intrauterine pregnancy in multigravida in second trimester; Anti-D antibodies present during pregnancy; Rh negative state in antepartum period; and Cystic fibrosis carrier on their problem list.  Patient reports no bleeding, no contractions, no cramping, no leaking and pelvic pain; hx of 3-4th deg lac, does not want C-section.  Contractions: Not present. Vag. Bleeding: None.   . Denies leaking of fluid.   The following portions of the patient's history were reviewed and updated as appropriate: allergies, current medications, past family history, past medical history, past social history, past surgical history and problem list. Problem list updated.  Objective:   Vitals:   10/16/17 1336  BP: 104/72  Pulse: 84  Weight: 130 lb (59 kg)    Fetal Status: Fetal Heart Rate (bpm): 152; doppler         General:  Alert, oriented and cooperative. Patient is in no acute distress.  Skin: Skin is warm and dry. No rash noted.   Cardiovascular: Normal heart rate noted  Respiratory: Normal respiratory effort, no problems with respiration noted  Abdomen: Soft, gravid, appropriate for gestational age.  Pain/Pressure: Present     Pelvic: Cervical exam deferred        Extremities: Normal range of motion.     Mental Status:  Normal mood and affect. Normal behavior. Normal judgment and thought content.   Assessment and Plan:  Pregnancy: G2P1001 at [redacted]w[redacted]d  1. Supervision of normal intrauterine pregnancy in multigravida in second trimester      Normal pregnancy discomforts discussed.   - AFP, Serum, Open Spina Bifida - Antibody identification; Future - Antibody identification  2. Depression affecting pregnancy in second trimester, antepartum     Prozac decreased to 20 mg  from 40 mg - FLUoxetine (PROZAC) 20 MG capsule; Take 1 capsule (20 mg total) by mouth daily.  Dispense: 30 capsule; Refill: 3  Preterm labor symptoms and general obstetric precautions including but not limited to vaginal bleeding, contractions, leaking of fluid and fetal movement were reviewed in detail with the patient. Please refer to After Visit Summary for other counseling recommendations.  Return in about 4 weeks (around 11/13/2017) for ROB.   Morene Crocker, CNM

## 2017-10-22 ENCOUNTER — Other Ambulatory Visit: Payer: Self-pay | Admitting: Certified Nurse Midwife

## 2017-10-22 DIAGNOSIS — Z3482 Encounter for supervision of other normal pregnancy, second trimester: Secondary | ICD-10-CM

## 2017-10-22 LAB — AFP, SERUM, OPEN SPINA BIFIDA
AFP MoM: 1
AFP Value: 28.4 ng/mL
GEST. AGE ON COLLECTION DATE: 15 wk
MATERNAL AGE AT EDD: 32 a
OSBR Risk 1 IN: 10000
Test Results:: NEGATIVE
Weight: 130 [lb_av]

## 2017-10-23 LAB — ANTIBODY IDENTIFICATION

## 2017-10-27 ENCOUNTER — Other Ambulatory Visit: Payer: Self-pay | Admitting: Certified Nurse Midwife

## 2017-10-27 ENCOUNTER — Encounter: Payer: Medicaid Other | Admitting: Obstetrics and Gynecology

## 2017-10-27 DIAGNOSIS — O360120 Maternal care for anti-D [Rh] antibodies, second trimester, not applicable or unspecified: Secondary | ICD-10-CM

## 2017-10-30 ENCOUNTER — Encounter (HOSPITAL_COMMUNITY): Payer: Self-pay | Admitting: Certified Nurse Midwife

## 2017-11-05 ENCOUNTER — Encounter (HOSPITAL_COMMUNITY): Payer: Self-pay | Admitting: Emergency Medicine

## 2017-11-05 ENCOUNTER — Ambulatory Visit (HOSPITAL_COMMUNITY)
Admission: EM | Admit: 2017-11-05 | Discharge: 2017-11-05 | Disposition: A | Discharge status: Home / Self Care | Payer: Medicaid Other | Attending: Family Medicine | Admitting: Family Medicine

## 2017-11-05 ENCOUNTER — Other Ambulatory Visit: Payer: Self-pay

## 2017-11-05 DIAGNOSIS — R05 Cough: Secondary | ICD-10-CM

## 2017-11-05 DIAGNOSIS — R059 Cough, unspecified: Secondary | ICD-10-CM

## 2017-11-05 LAB — POCT URINALYSIS DIP (DEVICE)
Bilirubin Urine: NEGATIVE
Glucose, UA: NEGATIVE mg/dL
Hgb urine dipstick: NEGATIVE
Ketones, ur: 15 mg/dL — AB
Leukocytes, UA: NEGATIVE
Nitrite: NEGATIVE
Protein, ur: NEGATIVE mg/dL
Specific Gravity, Urine: 1.02 (ref 1.005–1.030)
Urobilinogen, UA: 0.2 mg/dL (ref 0.0–1.0)
pH: 6.5 (ref 5.0–8.0)

## 2017-11-05 MED ORDER — AZITHROMYCIN 250 MG PO TABS: 250.0000 mg | ORAL_TABLET | Freq: Every day | ORAL | 0 refills | Status: AC

## 2017-11-05 MED ORDER — GUAIFENESIN-CODEINE 100-10 MG/5ML PO SYRP: 5.0000 mL | ORAL_SOLUTION | Freq: Three times a day (TID) | ORAL | 0 refills | Status: AC | PRN

## 2017-11-05 NOTE — ED Triage Notes (Signed)
Pt is here for a cough that she has had for four weeks.  She was seen by her PCP on 11/28 and given cough medicine.  She was also seen by her OB/GYN on 12/6.  The cough is not going away and it is causing her to vomit because it is so strong.

## 2017-11-05 NOTE — ED Provider Notes (Signed)
[place]    CSN: [id number] Arrival date & time: 11/05/17  1526     History   Chief Complaint Chief Complaint  Patient presents with  . Cough    HPI Tracy Gallegos is a 31 y.o. female, 17 weeks 6 days pregnant, presenting with a persistent cough for 1 month. She has been treating cough as a viral URI/post nasal drip and cough has been persistent. Coughing frequently turns into post-tussive emesis. Taking codeine/guanifesen cautiously, dextromorphan, loratidine, and cromolyn. These measures provide minimal relief. Worse at night and laying down. Taking Tylenol. Denies swelling, chest pain, shortness of breath, congestion.  HPI  Past Medical History:  Diagnosis Date  . Depression   . Endometriosis   . Ovarian cyst    Bilateral    Patient Active Problem List   Diagnosis Date Noted  . 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 Anti-D antibodies present during pregnancy 10/15/2017  . Supervision of normal intrauterine pregnancy in multigravida in second trimester 09/18/2017    Past Surgical History:  Procedure Laterality Date  . OVARIAN CYST SURGERY    . WISDOM TOOTH EXTRACTION      OB History    Gravida Para Term Preterm AB Living   2 1 1     1    SAB TAB Ectopic Multiple Live Births           1       Home Medications    Prior to Admission medications   Medication Sig Start Date End Date Taking? Authorizing Provider  FLUoxetine (PROZAC) 20 MG capsule Take 1 capsule (20 mg total) by mouth daily. 10/16/17  Yes Denney, Rachelle A, CNM  azithromycin (ZITHROMAX) 250 MG tablet Take 1 tablet (250 mg total) by mouth daily for 5 days. Take first 2 tablets together, then 1 every day until finished. 11/05/17 11/10/17  Honestie Kulik C, PA-C  DOXYLAMINE SUCCINATE PO Take 1 tablet by mouth 2 (two) times daily as needed (allergies). otc medication    [provider]  Doxylamine-Pyridoxine (DICLEGIS) 10-10  MG TBEC Take 1 tablet with breakfast and lunch.  Take 2 tablets at bedtime. 09/18/17   Kandis Cocking A, CNM  guaiFENesin-codeine (CHERATUSSIN AC) 100-10 MG/5ML syrup Take 5 mLs by mouth 3 (three) times daily as needed for up to 5 days for cough. 11/05/17 11/10/17  Deiontae Rabel C, PA-C  meclizine (ANTIVERT) 25 MG tablet Take 25 mg by mouth 2 (two) times daily as needed for nausea. otc medication    [provider]  metoCLOPramide (REGLAN) 10 MG tablet Take 1 tablet (10 mg total) by mouth 4 (four) times daily -  before meals and at bedtime. 08/29/17   Rasch, Anderson Malta I, NP  ondansetron (ZOFRAN ODT) 4 MG disintegrating tablet Take 1 tablet (4 mg total) by mouth every 8 (eight) hours as needed for nausea or vomiting. Patient not taking: Reported on 09/18/2017 08/29/17   Rasch, Anderson Malta I, NP  Prenatal Vit-Fe Fumarate-FA (PRENATAL MULTIVITAMIN) TABS tablet Take 1 tablet by mouth daily at 12 noon.    [provider]  pyridOXINE (VITAMIN B-6) 100 MG tablet Take 100 mg by mouth daily.    [provider]    Family History Family History  Problem Relation Age of Onset  . Hypertension Mother   . Obesity Mother   . Depression Mother   . Alcohol abuse Mother   . Heart disease Father   . Early death Father   .  Obesity Brother   . Alcohol abuse Maternal Uncle   . Depression Maternal Uncle   . Hypertension Maternal Grandmother   . Depression Maternal Grandmother   . Alcohol abuse Maternal Grandmother   . Alzheimer's disease Maternal Grandmother   . Depression Maternal Grandfather   . Stroke Maternal Grandfather   . Heart disease Maternal Grandfather   . Suicidality Maternal Grandfather   . Suicidality Paternal Grandmother   . Depression Paternal Grandmother   . Hearing loss Paternal Grandfather     Social History Social History   Tobacco Use  . Smoking status: Never Smoker  . Smokeless tobacco: Never Used  Substance Use Topics  . Alcohol use: No  . Drug use:  No     Allergies   Nitrous oxide   Review of Systems Review of Systems  Constitutional: Negative for fever.  HENT: Positive for sore throat. Negative for congestion and rhinorrhea.   Respiratory: Positive for cough and chest tightness. Negative for shortness of breath.   Cardiovascular: Negative for chest pain and leg swelling.  Gastrointestinal: Positive for abdominal pain and vomiting.  Genitourinary: Negative for dysuria and frequency.  Musculoskeletal: Negative for back pain.  Skin: Negative for rash.  Neurological: Negative for dizziness, weakness, light-headedness and headaches.     Physical Exam Triage Vital Signs ED Triage Vitals  Enc Vitals Group     BP 11/05/17 1701 109/63     Pulse Rate 11/05/17 1701 (!) 113     Resp --      Temp 11/05/17 1701 99 F (37.2 C)     Temp Source 11/05/17 1701 Oral     SpO2 11/05/17 1701 99 %     Weight --      Height --      Head Circumference --      Peak Flow --      Pain Score 11/05/17 1554 1     Pain Loc --      Pain Edu? --      Excl. in [place]? --    No data found.  Updated Vital Signs BP 109/63 (BP Location: Left Arm)   Pulse (!) 113   Temp 99 F (37.2 C) (Oral)   LMP 07/03/2017 Comment: breast  feeding  SpO2 99%    Physical Exam  Constitutional: She appears well-developed and well-nourished. No distress.  HENT:  Head: Normocephalic and atraumatic.  Mouth/Throat: Oropharynx is clear and moist.  Eyes: Conjunctivae are normal.  Neck: Neck supple.  Cardiovascular: Normal rate and regular rhythm.  No murmur heard. Pulmonary/Chest: Effort normal and breath sounds normal. No respiratory distress. She has no wheezes. She has no rales.  Coughing extremly frequently, barely goes 1 minute without coughing, often leading to gagging  Moving air well throughout, no wheezing or rales  Abdominal: Soft. She exhibits no distension. There is no tenderness.  Musculoskeletal: She exhibits no edema.  No swelling on either lower  extremities  Neurological: She is alert.  Skin: Skin is warm and dry.  Psychiatric: She has a normal mood and affect.  Nursing note and vitals reviewed.    UC Treatments / Results  Labs (all labs ordered are listed, but only abnormal results are displayed) Labs Reviewed  POCT URINALYSIS DIP (DEVICE) - Abnormal; Notable for the following components:      Result Value   Ketones, ur 15 (*)    All other components within normal limits    EKG  EKG Interpretation None  Radiology No results found.  Procedures Procedures (including critical care time)  Medications Ordered in UC Medications - No data to display   Initial Impression / Assessment and Plan / UC Course  I have reviewed the triage vital signs and the nursing notes.  Pertinent labs & imaging results that were available during my care of the patient were reviewed by me and considered in my medical decision making (see chart for details).     Discussed risk/benefit of CXR. No fever, 99% O2, tachycardic at 115. Opted for empiric treatment with azithromycin, and trial of Zantac for GERD. Will defer CXR now to avoid radiation to baby.  Differential includes GERD, atypical pneumonia, asthma, PE. Discussed return precautions to include increased chest pain, shortness of breath, difficulty breathing, unable to take in oral intake, dizziness, lightheadedness. Patient verbalized understanding and is agreeable with plan.   Final Clinical Impressions(s) / UC Diagnoses   Final diagnoses:  Cough    ED Discharge Orders        Ordered    guaiFENesin-codeine (CHERATUSSIN AC) 100-10 MG/5ML syrup  3 times daily PRN     11/05/17 1655    azithromycin (ZITHROMAX) 250 MG tablet  Daily     11/05/17 1655       Controlled Substance Prescriptions [place] Controlled Substance Registry consulted? Not Applicable   Janith Lima, [place] 11/05/17 2020

## 2017-11-05 NOTE — Discharge Instructions (Signed)
[place] of Zantac 150 twice daily for reflux- if this is not helping at all after 1 week, you may stop  Azithromycin- 2 tablets today, 1 tablet for the next 4 days for a possible pneumonia.  Please continue symptom management for cough.  Please return if cough not improving with treatment in 1 week.  If you develop increased shortness of breath, chest pain, swelling please go to emergency room.

## 2017-11-08 ENCOUNTER — Ambulatory Visit (HOSPITAL_COMMUNITY): Admission: EM | Admit: 2017-11-08 | Discharge: 2017-11-08 | Discharge status: Against Medical Advice | Payer: 59

## 2017-11-08 NOTE — ED Notes (Signed)
Pt informed registration clerk that she is unable to stay.

## 2017-11-10 ENCOUNTER — Encounter (HOSPITAL_COMMUNITY): Payer: Self-pay

## 2017-11-10 ENCOUNTER — Other Ambulatory Visit: Payer: Self-pay | Admitting: Certified Nurse Midwife

## 2017-11-10 ENCOUNTER — Ambulatory Visit (HOSPITAL_COMMUNITY)
Admission: RE | Admit: 2017-11-10 | Discharge: 2017-11-10 | Disposition: A | Discharge status: Home / Self Care | Payer: 59 | Source: Ambulatory Visit | Attending: Certified Nurse Midwife | Admitting: Certified Nurse Midwife

## 2017-11-10 DIAGNOSIS — Z141 Cystic fibrosis carrier: Secondary | ICD-10-CM

## 2017-11-10 DIAGNOSIS — Z3A18 18 weeks gestation of pregnancy: Secondary | ICD-10-CM

## 2017-11-10 DIAGNOSIS — Z3689 Encounter for other specified antenatal screening: Secondary | ICD-10-CM

## 2017-11-10 DIAGNOSIS — O360120 Maternal care for anti-D [Rh] antibodies, second trimester, not applicable or unspecified: Secondary | ICD-10-CM | POA: Diagnosis not present

## 2017-11-10 DIAGNOSIS — O09892 Supervision of other high risk pregnancies, second trimester: Secondary | ICD-10-CM | POA: Diagnosis not present

## 2017-11-10 DIAGNOSIS — Z3481 Encounter for supervision of other normal pregnancy, first trimester: Secondary | ICD-10-CM

## 2017-11-11 NOTE — L&D Delivery Note (Signed)
Delivery Note At 5:12 AM a viable female was delivered via Vaginal, Spontaneous (Presentation: mentum anterior face presentation  ).  APGAR: 8, 9; weight  6lb 5 oz Placenta status:3 vessel/intact , .  Cord:  with the following complications: small cord, skinny.  Cord pH: 7.18  Anesthesia:  epidural Episiotomy: None Lacerations: 1st degree;Vaginal and right vulvar laceration Suture Repair: 3.0 monocryl Est. Blood Loss (mL): 250  Mom to postpartum.  Baby to Couplet care / Skin to Skin.  [name] 03/30/2018, 5:48 AM

## 2017-11-12 ENCOUNTER — Telehealth: Payer: Self-pay

## 2017-11-12 ENCOUNTER — Other Ambulatory Visit (HOSPITAL_COMMUNITY): Payer: Self-pay | Admitting: *Deleted

## 2017-11-12 DIAGNOSIS — IMO0001 Reserved for inherently not codable concepts without codable children: Secondary | ICD-10-CM

## 2017-11-12 NOTE — Telephone Encounter (Signed)
Patient called in stating that she has had a chronic cough and now believes that she has broken a rib. Pt sounded like she was having trouble breathing, advised pt that she needs to be seen at [facility] as soon as possible. Pt stated that she did not want to get a bill from the hospital and wants to come into the office, advised that she would need to be seen at the hospital for treatment of her symptoms, pt said ok.

## 2017-11-14 ENCOUNTER — Ambulatory Visit: Payer: 59 | Admitting: Nurse Practitioner

## 2017-11-17 ENCOUNTER — Encounter: Payer: 59 | Admitting: Certified Nurse Midwife

## 2017-11-17 ENCOUNTER — Other Ambulatory Visit: Payer: Self-pay | Admitting: Certified Nurse Midwife

## 2017-11-17 DIAGNOSIS — Z3482 Encounter for supervision of other normal pregnancy, second trimester: Secondary | ICD-10-CM

## 2017-11-24 ENCOUNTER — Ambulatory Visit (HOSPITAL_COMMUNITY): Payer: Medicaid Other

## 2017-11-24 ENCOUNTER — Other Ambulatory Visit (HOSPITAL_COMMUNITY): Payer: Medicaid Other

## 2017-11-24 ENCOUNTER — Ambulatory Visit (INDEPENDENT_AMBULATORY_CARE_PROVIDER_SITE_OTHER): Payer: 59 | Admitting: Certified Nurse Midwife

## 2017-11-24 ENCOUNTER — Encounter: Payer: Self-pay | Admitting: Certified Nurse Midwife

## 2017-11-24 ENCOUNTER — Encounter (HOSPITAL_COMMUNITY): Payer: Medicaid Other

## 2017-11-24 VITALS — BP 105/71 | HR 97 | Wt 134.4 lb

## 2017-11-24 DIAGNOSIS — O360120 Maternal care for anti-D [Rh] antibodies, second trimester, not applicable or unspecified: Secondary | ICD-10-CM

## 2017-11-24 DIAGNOSIS — Z3482 Encounter for supervision of other normal pregnancy, second trimester: Secondary | ICD-10-CM

## 2017-11-24 NOTE — Progress Notes (Signed)
   PRENATAL VISIT NOTE  Subjective:  Tracy Gallegos is a 32 y.o. G2P1001 at [redacted]w[redacted]d being seen today for ongoing prenatal care.  She is currently monitored for the following issues for this low-risk pregnancy and has Supervision of normal intrauterine pregnancy in multigravida in second trimester; Anti-D antibodies present during pregnancy; Rh negative state in antepartum period; and Cystic fibrosis carrier on their problem list.  Patient reports no complaints.  Contractions: Not present. Vag. Bleeding: None.  Movement: Present. Denies leaking of fluid.   The following portions of the patient's history were reviewed and updated as appropriate: allergies, current medications, past family history, past medical history, past social history, past surgical history and problem list. Problem list updated.  Objective:   Vitals:   11/24/17 1354  BP: 105/71  Pulse: 97  Weight: 134 lb 6.4 oz (61 kg)    Fetal Status: Fetal Heart Rate (bpm): 150; doppler Fundal Height: 18 cm Movement: Present     General:  Alert, oriented and cooperative. Patient is in no acute distress.  Skin: Skin is warm and dry. No rash noted.   Cardiovascular: Normal heart rate noted  Respiratory: Normal respiratory effort, no problems with respiration noted  Abdomen: Soft, gravid, appropriate for gestational age.  Pain/Pressure: Absent     Pelvic: Cervical exam deferred        Extremities: Normal range of motion.  Edema: None  Mental Status:  Normal mood and affect. Normal behavior. Normal judgment and thought content.   Assessment and Plan:  Pregnancy: G2P1001 at [redacted]w[redacted]d  1. Supervision of normal intrauterine pregnancy in multigravida in second trimester     Doing well  2. Anti-D antibodies present during pregnancy in second trimester, single or unspecified fetus      - Antibody identification; Future  Preterm  labor symptoms and general obstetric precautions including but not limited to vaginal bleeding, contractions,  leaking of fluid and fetal movement were reviewed in detail with the patient. Please refer to After Visit Summary for other counseling recommendations.  Return in about 4 weeks (around 12/22/2017) for ROB.   Morene Crocker, CNM

## 2017-11-24 NOTE — Progress Notes (Signed)
Pt denies concerns at this time. 

## 2017-11-24 NOTE — Addendum Note (Signed)
Addended by: Carmela Hurt on: 11/24/2017 02:46 PM   Modules accepted: Orders

## 2017-11-25 ENCOUNTER — Encounter: Payer: Self-pay | Admitting: *Deleted

## 2017-11-25 LAB — ANTIBODY IDENTIFICATION

## 2017-12-05 ENCOUNTER — Ambulatory Visit (HOSPITAL_COMMUNITY)
Admission: RE | Admit: 2017-12-05 | Discharge: 2017-12-05 | Disposition: A | Discharge status: Home / Self Care | Payer: 59 | Source: Ambulatory Visit | Attending: Certified Nurse Midwife | Admitting: Certified Nurse Midwife

## 2017-12-05 ENCOUNTER — Other Ambulatory Visit (HOSPITAL_COMMUNITY): Payer: Self-pay | Admitting: Maternal and Fetal Medicine

## 2017-12-05 ENCOUNTER — Encounter (HOSPITAL_COMMUNITY): Payer: Self-pay

## 2017-12-05 DIAGNOSIS — O360120 Maternal care for anti-D [Rh] antibodies, second trimester, not applicable or unspecified: Secondary | ICD-10-CM | POA: Insufficient documentation

## 2017-12-05 DIAGNOSIS — Z141 Cystic fibrosis carrier: Secondary | ICD-10-CM | POA: Diagnosis not present

## 2017-12-05 DIAGNOSIS — O09892 Supervision of other high risk pregnancies, second trimester: Secondary | ICD-10-CM | POA: Insufficient documentation

## 2017-12-05 DIAGNOSIS — Z362 Encounter for other antenatal screening follow-up: Secondary | ICD-10-CM

## 2017-12-05 DIAGNOSIS — Z3A22 22 weeks gestation of pregnancy: Secondary | ICD-10-CM

## 2017-12-05 DIAGNOSIS — Z363 Encounter for antenatal screening for malformations: Secondary | ICD-10-CM | POA: Diagnosis not present

## 2017-12-05 DIAGNOSIS — IMO0001 Reserved for inherently not codable concepts without codable children: Secondary | ICD-10-CM

## 2017-12-05 DIAGNOSIS — O350XX0 Maternal care for (suspected) central nervous system malformation in fetus, not applicable or unspecified: Secondary | ICD-10-CM | POA: Diagnosis not present

## 2017-12-08 MED FILL — FLUoxetine HCL 20 MG CAPS: 20 | 30 days supply | Qty: 30 | Fill #0

## 2017-12-11 ENCOUNTER — Other Ambulatory Visit (HOSPITAL_COMMUNITY): Payer: Medicaid Other

## 2017-12-11 ENCOUNTER — Encounter (HOSPITAL_COMMUNITY): Payer: Medicaid Other

## 2017-12-23 ENCOUNTER — Other Ambulatory Visit: Payer: Self-pay

## 2017-12-23 ENCOUNTER — Ambulatory Visit (HOSPITAL_COMMUNITY)
Admission: RE | Admit: 2017-12-23 | Discharge: 2017-12-23 | Disposition: A | Discharge status: Home / Self Care | Payer: 59 | Source: Ambulatory Visit | Attending: Certified Nurse Midwife | Admitting: Certified Nurse Midwife

## 2017-12-23 ENCOUNTER — Ambulatory Visit (INDEPENDENT_AMBULATORY_CARE_PROVIDER_SITE_OTHER): Payer: 59 | Admitting: Certified Nurse Midwife

## 2017-12-23 ENCOUNTER — Encounter: Payer: Self-pay | Admitting: Certified Nurse Midwife

## 2017-12-23 VITALS — BP 97/66 | HR 82 | Wt 139.6 lb

## 2017-12-23 DIAGNOSIS — J181 Lobar pneumonia, unspecified organism: Secondary | ICD-10-CM | POA: Insufficient documentation

## 2017-12-23 DIAGNOSIS — O360120 Maternal care for anti-D [Rh] antibodies, second trimester, not applicable or unspecified: Secondary | ICD-10-CM

## 2017-12-23 DIAGNOSIS — J189 Pneumonia, unspecified organism: Secondary | ICD-10-CM

## 2017-12-23 DIAGNOSIS — Z3482 Encounter for supervision of other normal pregnancy, second trimester: Secondary | ICD-10-CM

## 2017-12-23 DIAGNOSIS — R05 Cough: Secondary | ICD-10-CM | POA: Diagnosis not present

## 2017-12-23 DIAGNOSIS — O09899 Supervision of other high risk pregnancies, unspecified trimester: Secondary | ICD-10-CM

## 2017-12-23 DIAGNOSIS — O26899 Other specified pregnancy related conditions, unspecified trimester: Secondary | ICD-10-CM

## 2017-12-23 DIAGNOSIS — Z6791 Unspecified blood type, Rh negative: Secondary | ICD-10-CM

## 2017-12-23 MED ORDER — BENZONATATE 100 MG PO CAPS: 100.0000 mg | ORAL_CAPSULE | Freq: Three times a day (TID) | ORAL | 0 refills | Status: DC | PRN

## 2017-12-23 MED ORDER — GUAIFENESIN-CODEINE 100-10 MG/5ML PO SOLN: 10.0000 mL | Freq: Three times a day (TID) | ORAL | 0 refills | Status: DC | PRN

## 2017-12-23 MED ORDER — AMOXICILLIN-POT CLAVULANATE 875-125 MG PO TABS: 1.0000 | ORAL_TABLET | Freq: Two times a day (BID) | ORAL | 0 refills | Status: DC

## 2017-12-23 MED ORDER — GUAIFENESIN-CODEINE 100-10 MG/5ML PO SOLN
10.0000 mL | Freq: Three times a day (TID) | ORAL | 0 refills | Status: DC | PRN
Start: 2017-12-23 — End: 2017-12-23

## 2017-12-23 NOTE — Progress Notes (Signed)
   PRENATAL VISIT NOTE  Subjective:  Tracy Gallegos is a 32 y.o. G2P1001 at [redacted]w[redacted]d being seen today for ongoing prenatal care.  She is currently monitored for the following issues for this low-risk pregnancy and has Supervision of normal intrauterine pregnancy in multigravida in second trimester; Anti-D antibodies present during pregnancy; Rh negative state in antepartum period; and Cystic fibrosis carrier on their problem list.  Patient reports no bleeding, no contractions, no cramping, no leaking and cough, denies fever, had pneumonia diagnosed November 2018, has not improved .  Contractions: Irritability. Vag. Bleeding: None.  Movement: Present. Denies leaking of fluid.   The following portions of the patient's history were reviewed and updated as appropriate: allergies, current medications, past family history, past medical history, past social history, past surgical history and problem list. Problem list updated.  Objective:   Vitals:   12/23/17 1355  BP: 97/66  Pulse: 82  Weight: 139 lb 9.6 oz (63.3 kg)    Fetal Status:     Movement: Present     General:  Alert, oriented and cooperative. Patient is in no acute distress.  Skin: Skin is warm and dry. No rash noted.   Cardiovascular: Normal heart rate noted  Respiratory: Normal respiratory effort, no problems with respiration noted, rhonchi/crackles right middle/lower lobes  Abdomen: Soft, gravid, appropriate for gestational age.  Pain/Pressure: Present     Pelvic: Cervical exam deferred        Extremities: Normal range of motion.  Edema: None  Mental Status:  Normal mood and affect. Normal behavior. Normal judgment and thought content.   Assessment and Plan:  Pregnancy: G2P1001 at [redacted]w[redacted]d  1. Supervision of normal intrauterine pregnancy in multigravida in second trimester     Probable continued pneumonia, treatment restarted, Chest x-ray ordered.  No labored breathing, pulse ox: 99% RA.   2. Anti-D antibodies present during  pregnancy in second trimester, single or unspecified fetus      3. Rh negative state in antepartum period     Rhogam planned.   4. Pneumonia of right middle lobe due to infectious organism [facility])    - DG Chest 2 View; Future - benzonatate (TESSALON PERLES) 100 MG capsule; Take 1 capsule (100 mg total) by mouth 3 (three) times daily as needed for cough.  Dispense: 30 capsule; Refill: 0 - amoxicillin-clavulanate (AUGMENTIN) 875-125 MG tablet; Take 1 tablet by mouth 2 (two) times daily.  Dispense: 14 tablet; Refill: 0 - guaiFENesin-codeine 100-10 MG/5ML syrup; Take 10 mLs by mouth 3 (three) times daily as needed for cough.  Dispense: 120 mL; Refill: 0  Preterm labor symptoms and general obstetric precautions including but not limited to vaginal bleeding, contractions, leaking of fluid and fetal movement were reviewed in detail with the patient. Please refer to After Visit Summary for other counseling recommendations.  Return in about 4 weeks (around 01/20/2018) for ROB, 2 hr OGTT; rhogam.   Morene Crocker, CNM

## 2017-12-23 NOTE — Progress Notes (Signed)
Complains of night time urinary frequency x6/night more than 2 months. Lots of pressure.

## 2018-01-08 ENCOUNTER — Encounter: Payer: Self-pay | Admitting: Certified Nurse Midwife

## 2018-01-08 ENCOUNTER — Ambulatory Visit (INDEPENDENT_AMBULATORY_CARE_PROVIDER_SITE_OTHER): Payer: 59 | Admitting: Certified Nurse Midwife

## 2018-01-08 VITALS — BP 108/68 | HR 104 | Wt 143.6 lb

## 2018-01-08 DIAGNOSIS — O26899 Other specified pregnancy related conditions, unspecified trimester: Secondary | ICD-10-CM

## 2018-01-08 DIAGNOSIS — Z6791 Unspecified blood type, Rh negative: Secondary | ICD-10-CM

## 2018-01-08 DIAGNOSIS — O09899 Supervision of other high risk pregnancies, unspecified trimester: Secondary | ICD-10-CM

## 2018-01-08 DIAGNOSIS — Z3482 Encounter for supervision of other normal pregnancy, second trimester: Secondary | ICD-10-CM

## 2018-01-08 NOTE — Progress Notes (Signed)
   PRENATAL VISIT NOTE  Subjective:  Tracy Gallegos is a 32 y.o. G2P1001 at [redacted]w[redacted]d being seen today for ongoing prenatal care.  She is currently monitored for the following issues for this low-risk pregnancy and has Supervision of normal intrauterine pregnancy in multigravida in second trimester; Anti-D antibodies present during pregnancy; Rh negative state in antepartum period; and Cystic fibrosis carrier on their problem list.  Patient reports no complaints.  Contractions: Irritability. Vag. Bleeding: None.  Movement: Present. Denies leaking of fluid.   The following portions of the patient's history were reviewed and updated as appropriate: allergies, current medications, past family history, past medical history, past social history, past surgical history and problem list. Problem list updated.  Objective:   Vitals:   01/08/18 1343  BP: 108/68  Pulse: (!) 104  Weight: 143 lb 9.6 oz (65.1 kg)    Fetal Status: Fetal Heart Rate (bpm): 158; doppler Fundal Height: 26 cm Movement: Present     General:  Alert, oriented and cooperative. Patient is in no acute distress.  Skin: Skin is warm and dry. No rash noted.   Cardiovascular: Normal heart rate noted  Respiratory: Normal respiratory effort, no problems with respiration noted  Abdomen: Soft, gravid, appropriate for gestational age.  Pain/Pressure: Present     Pelvic: Cervical exam deferred        Extremities: Normal range of motion.  Edema: None  Mental Status:  Normal mood and affect. Normal behavior. Normal judgment and thought content.   Assessment and Plan:  Pregnancy: G2P1001 at [redacted]w[redacted]d  1. Supervision of normal intrauterine pregnancy in multigravida in second trimester     Doing well.  Bronchitis improved.   2. Rh negative state in antepartum period     Rhogam with 28 week labs/titer/TDaP discussed.   Preterm labor symptoms and general obstetric precautions including but not limited to vaginal bleeding, contractions, leaking  of fluid and fetal movement were reviewed in detail with the patient. Please refer to After Visit Summary for other counseling recommendations.  Return in about 2 weeks (around 01/22/2018) for ROB, 2 hr OGTT/Rhogam/Titers/TDaP.   Morene Crocker, CNM

## 2018-01-08 NOTE — Progress Notes (Signed)
Pt denies concerns at this time. 

## 2018-01-15 ENCOUNTER — Telehealth: Payer: Self-pay

## 2018-01-15 NOTE — Telephone Encounter (Signed)
TC from pt requests refill on ACCU-CHEK AVIVA PLUS test strips. She said she has the meter and lancets left from previous pregnancy. Pt said due to hx GDM, she wants to check sugar levels 4x's daily prior to testing next Friday. She said lately they've been running 70-90 fasting and postprandial readings 100-150. Informed pt I will consult with provider and c/b.

## 2018-01-16 ENCOUNTER — Other Ambulatory Visit: Payer: Self-pay

## 2018-01-16 MED ORDER — GLUCOSE BLOOD VI STRP: ORAL_STRIP | 12 refills | Status: DC

## 2018-01-16 MED ORDER — FREESTYLE LITE DEVI: 1.0000 | Freq: Four times a day (QID) | 0 refills | Status: DC

## 2018-01-16 MED FILL — FREESTYLE LITE TEST STRIP: 25 days supply | Qty: 100 | Fill #0

## 2018-01-16 MED FILL — FREESTYLE LITE METER: 30 days supply | Qty: 1 | Fill #0

## 2018-01-16 NOTE — Progress Notes (Addendum)
Spoke with pharmacist. Aviva test strips is not covered by pt's insurance but Freestyle Lite is. TC to pt. Pt questions copay on Freestyle Lite. TC back to pharmacy. I called 5x's, someone will pick up and hang up. Pt is aware and said she has this issue too but since we are closing, she will try to contact them herself.

## 2018-01-16 NOTE — Telephone Encounter (Signed)
Pt aware test strips were approved and sent to her pharmacy.

## 2018-01-23 ENCOUNTER — Ambulatory Visit (INDEPENDENT_AMBULATORY_CARE_PROVIDER_SITE_OTHER): Payer: 59 | Admitting: Certified Nurse Midwife

## 2018-01-23 ENCOUNTER — Other Ambulatory Visit: Payer: 59

## 2018-01-23 VITALS — BP 101/70 | HR 78 | Wt 146.4 lb

## 2018-01-23 DIAGNOSIS — Z6791 Unspecified blood type, Rh negative: Secondary | ICD-10-CM

## 2018-01-23 DIAGNOSIS — Z3482 Encounter for supervision of other normal pregnancy, second trimester: Secondary | ICD-10-CM | POA: Diagnosis not present

## 2018-01-23 DIAGNOSIS — Z8632 Personal history of gestational diabetes: Secondary | ICD-10-CM

## 2018-01-23 DIAGNOSIS — O09899 Supervision of other high risk pregnancies, unspecified trimester: Secondary | ICD-10-CM

## 2018-01-23 DIAGNOSIS — Z23 Encounter for immunization: Secondary | ICD-10-CM | POA: Diagnosis not present

## 2018-01-23 DIAGNOSIS — O26899 Other specified pregnancy related conditions, unspecified trimester: Secondary | ICD-10-CM

## 2018-01-23 DIAGNOSIS — O360130 Maternal care for anti-D [Rh] antibodies, third trimester, not applicable or unspecified: Secondary | ICD-10-CM

## 2018-01-23 HISTORY — DX: Personal history of gestational diabetes: Z86.32

## 2018-01-23 MED ORDER — RHO D IMMUNE GLOBULIN 1500 UNIT/2ML IJ SOSY
300.0000 ug | PREFILLED_SYRINGE | Freq: Once | INTRAMUSCULAR | Status: AC
  Administered 2018-01-23: 300 ug via INTRAMUSCULAR

## 2018-01-23 NOTE — Progress Notes (Signed)
   PRENATAL VISIT NOTE  Subjective:  Tracy Gallegos is a 32 y.o. G2P1001 at [redacted]w[redacted]d being seen today for ongoing prenatal care.  She is currently monitored for the following issues for this low-risk pregnancy and has Supervision of normal intrauterine pregnancy in multigravida in second trimester; Anti-D antibodies present during pregnancy; Rh negative state in antepartum period; Cystic fibrosis carrier; and History of gestational diabetes on their problem list.  Patient reports no complaints.  Contractions: Not present. Vag. Bleeding: None.  Movement: Present. Denies leaking of fluid.   The following portions of the patient's history were reviewed and updated as appropriate: allergies, current medications, past family history, past medical history, past social history, past surgical history and problem list. Problem list updated.  Objective:   Vitals:   01/23/18 0849  BP: 101/70  Pulse: 78  Weight: 146 lb 6.4 oz (66.4 kg)    Fetal Status: Fetal Heart Rate (bpm): 148; doppler Fundal Height: 27 cm Movement: Present     General:  Alert, oriented and cooperative. Patient is in no acute distress.  Skin: Skin is warm and dry. No rash noted.   Cardiovascular: Normal heart rate noted  Respiratory: Normal respiratory effort, no problems with respiration noted  Abdomen: Soft, gravid, appropriate for gestational age.  Pain/Pressure: Absent     Pelvic: Cervical exam deferred        Extremities: Normal range of motion.  Edema: None  Mental Status:  Normal mood and affect. Normal behavior. Normal judgment and thought content.   Assessment and Plan:  Pregnancy: G2P1001 at [redacted]w[redacted]d  1. Supervision of normal intrauterine pregnancy in multigravida in second trimester      Doing well - Glucose Tolerance, 2 Hours w/1 Hour - CBC - HIV antibody (with reflex) - RPR  2. Anti-D antibodies present during pregnancy in third trimester, single or unspecified fetus     Titer today - Antibody identification;  Future  3. Rh negative state in antepartum period     Rhogam given today.   4. History of gestational diabetes     Normal A1C at start of pregnancy  Preterm labor symptoms and general obstetric precautions including but not limited to vaginal bleeding, contractions, leaking of fluid and fetal movement were reviewed in detail with the patient. Please refer to After Visit Summary for other counseling recommendations.  Return in about 2 weeks (around 02/06/2018) for [place].   Morene Crocker, CNM

## 2018-01-23 NOTE — Progress Notes (Signed)
Patient reports fetal movement, denies pain. 

## 2018-01-24 LAB — GLUCOSE TOLERANCE, 2 HOURS W/ 1HR
GLUCOSE, 1 HOUR: 184 mg/dL — AB (ref 65–179)
GLUCOSE, FASTING: 78 mg/dL (ref 65–91)
Glucose, 2 hour: 66 mg/dL (ref 65–152)

## 2018-01-24 LAB — CBC
Hematocrit: 39 % (ref 34.0–46.6)
Hemoglobin: 13.1 g/dL (ref 11.1–15.9)
MCH: 31.6 pg (ref 26.6–33.0)
MCHC: 33.6 g/dL (ref 31.5–35.7)
MCV: 94 fL (ref 79–97)
PLATELETS: 195 10*3/uL (ref 150–379)
RBC: 4.15 x10E6/uL (ref 3.77–5.28)
RDW: 12.8 % (ref 12.3–15.4)
WBC: 8.9 10*3/uL (ref 3.4–10.8)

## 2018-01-24 LAB — RPR: RPR Ser Ql: NONREACTIVE

## 2018-01-24 LAB — HIV ANTIBODY (ROUTINE TESTING W REFLEX): HIV SCREEN 4TH GENERATION: NONREACTIVE

## 2018-01-26 ENCOUNTER — Other Ambulatory Visit: Payer: Self-pay | Admitting: Certified Nurse Midwife

## 2018-01-26 ENCOUNTER — Telehealth: Payer: Self-pay

## 2018-01-26 DIAGNOSIS — O24419 Gestational diabetes mellitus in pregnancy, unspecified control: Secondary | ICD-10-CM | POA: Insufficient documentation

## 2018-01-26 DIAGNOSIS — O24410 Gestational diabetes mellitus in pregnancy, diet controlled: Secondary | ICD-10-CM

## 2018-01-26 MED FILL — FLUoxetine HCL 20 MG CAPS: 20 | 30 days supply | Qty: 30 | Fill #1

## 2018-01-26 NOTE — Telephone Encounter (Signed)
Return call to pt regarding 2 hr Result And MFM referral orders. Pt has a few other concerns and questions she wanted to speak with Tracy Gallegos in detail with pt also has ROB appt 02/05/18 Pt states she is going to dend a message via Mychart.

## 2018-01-28 ENCOUNTER — Other Ambulatory Visit: Payer: Self-pay | Admitting: Certified Nurse Midwife

## 2018-01-30 ENCOUNTER — Encounter: Payer: Self-pay | Admitting: Certified Nurse Midwife

## 2018-02-05 ENCOUNTER — Ambulatory Visit (INDEPENDENT_AMBULATORY_CARE_PROVIDER_SITE_OTHER): Payer: 59 | Admitting: Certified Nurse Midwife

## 2018-02-05 VITALS — BP 100/64 | HR 70 | Wt 145.3 lb

## 2018-02-05 DIAGNOSIS — Z6791 Unspecified blood type, Rh negative: Secondary | ICD-10-CM

## 2018-02-05 DIAGNOSIS — O360130 Maternal care for anti-D [Rh] antibodies, third trimester, not applicable or unspecified: Secondary | ICD-10-CM | POA: Diagnosis not present

## 2018-02-05 DIAGNOSIS — O0990 Supervision of high risk pregnancy, unspecified, unspecified trimester: Secondary | ICD-10-CM

## 2018-02-05 DIAGNOSIS — O26899 Other specified pregnancy related conditions, unspecified trimester: Secondary | ICD-10-CM

## 2018-02-05 DIAGNOSIS — O24410 Gestational diabetes mellitus in pregnancy, diet controlled: Secondary | ICD-10-CM | POA: Diagnosis not present

## 2018-02-05 DIAGNOSIS — O09899 Supervision of other high risk pregnancies, unspecified trimester: Secondary | ICD-10-CM

## 2018-02-05 DIAGNOSIS — O24415 Gestational diabetes mellitus in pregnancy, controlled by oral hypoglycemic drugs: Secondary | ICD-10-CM

## 2018-02-05 MED ORDER — GLYBURIDE 2.5 MG PO TABS: 2.5000 mg | ORAL_TABLET | Freq: Every day | ORAL | 3 refills | Status: DC

## 2018-02-05 MED FILL — glyBURIDE 2.5 MG TABS: 2.5 | 30 days supply | Qty: 30 | Fill #0

## 2018-02-05 NOTE — Progress Notes (Signed)
   PRENATAL VISIT NOTE  Subjective:  Tracy Gallegos is a 32 y.o. G2P1001 at [redacted]w[redacted]d being seen today for ongoing prenatal care.  She is currently monitored for the following issues for this high-risk pregnancy and has Supervision of high risk pregnancy, antepartum; Anti-D antibodies present during pregnancy; Rh negative state in antepartum period; Cystic fibrosis carrier; History of gestational diabetes; and GDM (gestational diabetes mellitus) on their problem list.  Patient reports no complaints.  Contractions: Irritability. Vag. Bleeding: None.  Movement: Present. Denies leaking of fluid.   The following portions of the patient's history were reviewed and updated as appropriate: allergies, current medications, past family history, past medical history, past social history, past surgical history and problem list. Problem list updated.  Objective:   Vitals:   02/05/18 1444  BP: 100/64  Pulse: 70  Weight: 145 lb 4.8 oz (65.9 kg)    Fetal Status: Fetal Heart Rate (bpm): 145; doppler Fundal Height: 30 cm Movement: Present     General:  Alert, oriented and cooperative. Patient is in no acute distress.  Skin: Skin is warm and dry. No rash noted.   Cardiovascular: Normal heart rate noted  Respiratory: Normal respiratory effort, no problems with respiration noted  Abdomen: Soft, gravid, appropriate for gestational age.  Pain/Pressure: Absent     Pelvic: Cervical exam deferred        Extremities: Normal range of motion.  Edema: None  Mental Status:  Normal mood and affect. Normal behavior. Normal judgment and thought content.   Assessment and Plan:  Pregnancy: G2P1001 at [redacted]w[redacted]d  1. Supervision of high risk pregnancy, antepartum      Doing well.   2. Rh negative state in antepartum period     Rhogam given 01/23/18  3. Diet controlled gestational diabetes mellitus (GDM) in third trimester     Discussed POC with Dr. Elly Modena.  Based on 2 hour PP blood sugars: ok to start on Glyburide 2.5mg   with breakfast.  F/U growth [place] ordered for 37 weeks based on previous history of traumatic delivery (4th deg lac.). Patient reported fasting's: 70-80's, 2 hour PP 140-150's.  Has been watching her diet.  Declines nutrition/DM teaching.    4. Anti-D antibodies present during pregnancy in third trimester, single or unspecified fetus     Titers today.  - Antibody identification  Preterm labor symptoms and general obstetric precautions including but not limited to vaginal bleeding, contractions, leaking of fluid and fetal movement were reviewed in detail with the patient. Please refer to After Visit Summary for other counseling recommendations.  Return in about 1 week (around 02/12/2018) for [facility], Needs to see FP MD here, Bi-weekly Antenatal testing starting next Thursday with weekly AFI.   Morene Crocker, CNM

## 2018-02-07 LAB — HEMOGLOBIN A1C
Est. average glucose Bld gHb Est-mCnc: 100 mg/dL
HEMOGLOBIN A1C: 5.1 % (ref 4.8–5.6)

## 2018-02-07 LAB — ANTIBODY IDENTIFICATION

## 2018-02-09 ENCOUNTER — Other Ambulatory Visit: Payer: 59

## 2018-02-11 ENCOUNTER — Encounter: Payer: 59 | Admitting: Obstetrics & Gynecology

## 2018-02-12 ENCOUNTER — Ambulatory Visit (INDEPENDENT_AMBULATORY_CARE_PROVIDER_SITE_OTHER): Payer: 59 | Admitting: Obstetrics and Gynecology

## 2018-02-12 ENCOUNTER — Encounter: Payer: Self-pay | Admitting: Obstetrics and Gynecology

## 2018-02-12 ENCOUNTER — Other Ambulatory Visit: Payer: 59

## 2018-02-12 VITALS — BP 118/79 | HR 71 | Wt 146.0 lb

## 2018-02-12 DIAGNOSIS — O0990 Supervision of high risk pregnancy, unspecified, unspecified trimester: Secondary | ICD-10-CM

## 2018-02-12 DIAGNOSIS — O26899 Other specified pregnancy related conditions, unspecified trimester: Secondary | ICD-10-CM

## 2018-02-12 DIAGNOSIS — Z6791 Unspecified blood type, Rh negative: Secondary | ICD-10-CM

## 2018-02-12 DIAGNOSIS — O24410 Gestational diabetes mellitus in pregnancy, diet controlled: Secondary | ICD-10-CM

## 2018-02-12 MED ORDER — GLUCOSE BLOOD VI STRP: ORAL_STRIP | 12 refills | Status: DC

## 2018-02-12 MED FILL — FREESTYLE LITE TEST STRIP: 17 days supply | Qty: 100 | Fill #0

## 2018-02-12 NOTE — Addendum Note (Signed)
Addended by: Lucianne Lei on: 02/12/2018 02:36 PM   Modules accepted: Orders

## 2018-02-12 NOTE — Progress Notes (Signed)
   PRENATAL VISIT NOTE  Subjective:  Tracy Gallegos is a 32 y.o. G2P1001 at [redacted]w[redacted]d being seen today for ongoing prenatal care.  She is currently monitored for the following issues for this high-risk pregnancy and has Supervision of high risk pregnancy, antepartum; Anti-D antibodies present during pregnancy; Rh negative state in antepartum period; Cystic fibrosis carrier; History of gestational diabetes; and GDM (gestational diabetes mellitus) on their problem list.  Patient reports no complaints.  Contractions: Irritability. Vag. Bleeding: None.  Movement: Present. Denies leaking of fluid.   The following portions of the patient's history were reviewed and updated as appropriate: allergies, current medications, past family history, past medical history, past social history, past surgical history and problem list. Problem list updated.  Objective:   Vitals:   02/12/18 1352  BP: 122/75  Pulse: 80  Weight: 146 lb (66.2 kg)    Fetal Status: Fetal Heart Rate (bpm): NST   Movement: Present     General:  Alert, oriented and cooperative. Patient is in no acute distress.  Skin: Skin is warm and dry. No rash noted.   Cardiovascular: Normal heart rate noted  Respiratory: Normal respiratory effort, no problems with respiration noted  Abdomen: Soft, gravid, appropriate for gestational age.  Pain/Pressure: Present     Pelvic: Cervical exam deferred        Extremities: Normal range of motion.  Edema: None  Mental Status: Normal mood and affect. Normal behavior. Normal judgment and thought content.   Assessment and Plan:  Pregnancy: G2P1001 at [redacted]w[redacted]d  1. Supervision of high risk pregnancy, antepartum Patient is doing well without complaints Discussed option of PCS due to h/o 4th degree laceration and patient desires vaginal delivery  2. Diet controlled gestational diabetes mellitus (GDM) in third trimester Patient started on glyburide 2.5 mg BID but has been breaking tablet and taking with every  meal with doses ranging from 0.625 to 2.5 mg based on carb content of meal) Explained with patient that glyburide is not a short acting insulin. She admits to experiencing some episodes of hypoglycemia CBGs reviewed and great majority within range Advised to take glyburide 1.25 mg BID NST reviewed and reactive with baseline 140, mod variability, +accels, no deces Growth ultrasound orderd  3. Rh negative state in antepartum period   Preterm labor symptoms and general obstetric precautions including but not limited to vaginal bleeding, contractions, leaking of fluid and fetal movement were reviewed in detail with the patient. Please refer to After Visit Summary for other counseling recommendations.  Return in about 1 week (around 02/19/2018) for ROB, NST, AFI.  Future Appointments  Date Time Provider [place]  02/16/2018  2:00 PM [place] None  02/19/2018  1:45 PM [place] None  02/19/2018  2:30 PM Ming Mcmannis, MD [place] None  02/23/2018  1:15 PM [place] None  02/26/2018  1:45 PM Sloan Leiter, MD [facility] None  02/26/2018  2:00 PM [place] None  03/24/2018  3:00 PM [place] 3 WH-MFCUS MFC-US    Mora Bellman, MD

## 2018-02-16 ENCOUNTER — Other Ambulatory Visit: Payer: 59

## 2018-02-19 ENCOUNTER — Encounter: Payer: Self-pay | Admitting: Obstetrics and Gynecology

## 2018-02-19 ENCOUNTER — Ambulatory Visit (INDEPENDENT_AMBULATORY_CARE_PROVIDER_SITE_OTHER): Payer: 59 | Admitting: Obstetrics and Gynecology

## 2018-02-19 ENCOUNTER — Other Ambulatory Visit: Payer: 59

## 2018-02-19 VITALS — BP 103/71 | HR 80 | Wt 148.6 lb

## 2018-02-19 DIAGNOSIS — O0993 Supervision of high risk pregnancy, unspecified, third trimester: Secondary | ICD-10-CM

## 2018-02-19 DIAGNOSIS — Z6791 Unspecified blood type, Rh negative: Secondary | ICD-10-CM

## 2018-02-19 DIAGNOSIS — O24410 Gestational diabetes mellitus in pregnancy, diet controlled: Secondary | ICD-10-CM

## 2018-02-19 DIAGNOSIS — O26899 Other specified pregnancy related conditions, unspecified trimester: Secondary | ICD-10-CM

## 2018-02-19 DIAGNOSIS — O26893 Other specified pregnancy related conditions, third trimester: Secondary | ICD-10-CM

## 2018-02-19 DIAGNOSIS — O0990 Supervision of high risk pregnancy, unspecified, unspecified trimester: Secondary | ICD-10-CM

## 2018-02-19 MED ORDER — ACCU-CHEK SOFTCLIX LANCET DEV KIT: PACK | 1 refills | Status: DC

## 2018-02-19 MED ORDER — ACCU-CHEK SOFTCLIX LANCETS MISC: 12 refills | Status: DC

## 2018-02-19 MED ORDER — FREESTYLE LANCETS MISC: 12 refills | Status: DC

## 2018-02-19 NOTE — Progress Notes (Signed)
Needs freestyle lite lancents

## 2018-02-19 NOTE — Addendum Note (Signed)
Addended by: Lucianne Lei on: 02/19/2018 02:38 PM   Modules accepted: Orders

## 2018-02-19 NOTE — Progress Notes (Signed)
   PRENATAL VISIT NOTE  Subjective:  Tracy Gallegos is a 32 y.o. G2P1001 at [redacted]w[redacted]d being seen today for ongoing prenatal care.  She is currently monitored for the following issues for this high-risk pregnancy and has Supervision of high risk pregnancy, antepartum; Anti-D antibodies present during pregnancy; Rh negative state in antepartum period; Cystic fibrosis carrier; History of gestational diabetes; and GDM (gestational diabetes mellitus) on their problem list.  Patient reports no complaints.  Contractions: Irritability. Vag. Bleeding: None.  Movement: Present. Denies leaking of fluid.   The following portions of the patient's history were reviewed and updated as appropriate: allergies, current medications, past family history, past medical history, past social history, past surgical history and problem list. Problem list updated.  Objective:   Vitals:   02/19/18 1341  BP: 103/71  Pulse: 80  Weight: 148 lb 9.6 oz (67.4 kg)    Fetal Status:     Movement: Present     General:  Alert, oriented and cooperative. Patient is in no acute distress.  Skin: Skin is warm and dry. No rash noted.   Cardiovascular: Normal heart rate noted  Respiratory: Normal respiratory effort, no problems with respiration noted  Abdomen: Soft, gravid, appropriate for gestational age.  Pain/Pressure: Absent     Pelvic: Cervical exam deferred        Extremities: Normal range of motion.  Edema: None  Mental Status: Normal mood and affect. Normal behavior. Normal judgment and thought content.   Assessment and Plan:  Pregnancy: G2P1001 at [redacted]w[redacted]d  1. Supervision of high risk pregnancy, antepartum Patient is doing well without complaints  2. 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 Rh negative state in antepartum period S/p rhogam  Preterm labor symptoms and general obstetric precautions including but not limited to vaginal bleeding, contractions, leaking of fluid and fetal movement were reviewed in detail with the patient. Please refer to After Visit Summary for other counseling recommendations.  Return in about 1 week (around 02/26/2018) for ROB, NST, AFI.  Future Appointments  Date Time Provider [place]  02/19/2018  2:30 PM Vayden Weinand, Vickii Chafe, MD [place] None  02/23/2018  1:15 PM [place] None  02/26/2018  1:45 PM Sloan Leiter, MD [place] None  02/26/2018  2:00 PM [place] None  03/24/2018  3:00 PM [place] 3 WH-MFCUS MFC-US    Mora Bellman, MD

## 2018-02-20 MED FILL — FREESTYLE LANCETS: 25 days supply | Qty: 100 | Fill #0

## 2018-02-23 ENCOUNTER — Ambulatory Visit (INDEPENDENT_AMBULATORY_CARE_PROVIDER_SITE_OTHER): Payer: 59

## 2018-02-23 DIAGNOSIS — O24410 Gestational diabetes mellitus in pregnancy, diet controlled: Secondary | ICD-10-CM | POA: Diagnosis not present

## 2018-02-23 NOTE — Progress Notes (Signed)
Patient with GDM here for NST NST reviewed and reactive with baseline 140, mod variability, +accels, no decels

## 2018-02-23 NOTE — Progress Notes (Signed)
NST ONlY TODAY  B/P:105/73 P:80 Reactive per Dr.Constant  Pt had additional questions Provider was present to answer patients concerns.

## 2018-02-26 ENCOUNTER — Other Ambulatory Visit: Payer: 59

## 2018-02-26 ENCOUNTER — Ambulatory Visit (INDEPENDENT_AMBULATORY_CARE_PROVIDER_SITE_OTHER): Payer: 59 | Admitting: Obstetrics and Gynecology

## 2018-02-26 VITALS — BP 101/71 | HR 91 | Wt 148.6 lb

## 2018-02-26 DIAGNOSIS — Z8632 Personal history of gestational diabetes: Secondary | ICD-10-CM

## 2018-02-26 DIAGNOSIS — Z3A34 34 weeks gestation of pregnancy: Secondary | ICD-10-CM | POA: Diagnosis not present

## 2018-02-26 DIAGNOSIS — Z141 Cystic fibrosis carrier: Secondary | ICD-10-CM

## 2018-02-26 DIAGNOSIS — O24415 Gestational diabetes mellitus in pregnancy, controlled by oral hypoglycemic drugs: Secondary | ICD-10-CM | POA: Diagnosis not present

## 2018-02-26 DIAGNOSIS — O0993 Supervision of high risk pregnancy, unspecified, third trimester: Secondary | ICD-10-CM | POA: Diagnosis not present

## 2018-02-26 DIAGNOSIS — Z6791 Unspecified blood type, Rh negative: Secondary | ICD-10-CM

## 2018-02-26 DIAGNOSIS — O26893 Other specified pregnancy related conditions, third trimester: Secondary | ICD-10-CM

## 2018-02-26 DIAGNOSIS — O360130 Maternal care for anti-D [Rh] antibodies, third trimester, not applicable or unspecified: Secondary | ICD-10-CM

## 2018-02-26 DIAGNOSIS — O0990 Supervision of high risk pregnancy, unspecified, unspecified trimester: Secondary | ICD-10-CM

## 2018-02-26 DIAGNOSIS — O26899 Other specified pregnancy related conditions, unspecified trimester: Secondary | ICD-10-CM

## 2018-02-26 MED ORDER — GLYBURIDE 1.25 MG PO TABS: 1.2500 mg | ORAL_TABLET | Freq: Two times a day (BID) | ORAL | 3 refills | Status: DC

## 2018-02-26 MED FILL — glyBURIDE 1.25 MG TABS: 1.25 | 30 days supply | Qty: 60 | Fill #0

## 2018-02-26 NOTE — Progress Notes (Signed)
   PRENATAL VISIT NOTE  Subjective:  Tracy Gallegos is a 32 y.o. G2P1001 at [redacted]w[redacted]d being seen today for ongoing prenatal care.  She is currently monitored for the following issues for this high-risk pregnancy and has Supervision of high risk pregnancy, antepartum; Anti-D antibodies present during pregnancy; Rh negative state in antepartum period; Cystic fibrosis carrier; History of gestational diabetes; and GDM (gestational diabetes mellitus) on their problem list.  Patient reports occasional contractions.  Contractions: Irregular. Vag. Bleeding: None.  Movement: Present. Denies leaking of fluid.   The following portions of the patient's history were reviewed and updated as appropriate: allergies, current medications, past family history, past medical history, past social history, past surgical history and problem list. Problem list updated.  Objective:   Vitals:   02/26/18 1402  BP: 101/71  Pulse: 91  Weight: 148 lb 9.6 oz (67.4 kg)    Fetal Status: Fetal Heart Rate (bpm): NST   Movement: Present     General:  Alert, oriented and cooperative. Patient is in no acute distress.  Skin: Skin is warm and dry. No rash noted.   Cardiovascular: Normal heart rate noted  Respiratory: Normal respiratory effort, no problems with respiration noted  Abdomen: Soft, gravid, appropriate for gestational age.  Pain/Pressure: Absent     Pelvic: Cervical exam deferred        Extremities: Normal range of motion.  Edema: None  Mental Status: Normal mood and affect. Normal behavior. Normal judgment and thought content.   Assessment and Plan:  Pregnancy: G2P1001 at [redacted]w[redacted]d  1. Gestational diabetes mellitus (GDM) in third trimester controlled on oral hypoglycemic drug On glyburide 1.25 mg BID, FG all 80s, PP 110s-130s, mostly < 130s but occasional outlier, will consider increasing am glyburide next visit if PP are trending upwards - Fetal nonstress test - US OB Limited NST today reactive AFI 15 cm  Next  growth 03/24/18  2. Supervision of high risk pregnancy, antepartum  3. Anti-D antibodies present during pregnancy in third trimester, single or unspecified fetus Repeat titers next visit  4. Rh negative state in antepartum period  5. Cystic fibrosis carrier  6. History of gestational diabetes  Preterm labor symptoms and general obstetric precautions including but not limited to vaginal bleeding, contractions, leaking of fluid and fetal movement were reviewed in detail with the patient. Please refer to After Visit Summary for other counseling recommendations.  Return in about 1 week (around 03/05/2018) for OB visit (MD).  Future Appointments  Date Time Provider [place]  03/02/2018  1:00 PM [place] None  03/05/2018  1:00 PM Luvenia Redden, PA-C [facility] None  03/05/2018  1:15 PM [place] None  03/24/2018  3:00 PM [place] 3 [facility] M[facility]-US  06/30/2018 11:45 AM Lucille Passy, MD [facility]    Sloan Leiter, MD

## 2018-02-26 NOTE — Progress Notes (Signed)
NST DX GDM 

## 2018-03-02 ENCOUNTER — Other Ambulatory Visit: Payer: 59

## 2018-03-02 ENCOUNTER — Other Ambulatory Visit: Payer: Self-pay | Admitting: Certified Nurse Midwife

## 2018-03-02 DIAGNOSIS — F329 Major depressive disorder, single episode, unspecified: Secondary | ICD-10-CM

## 2018-03-02 DIAGNOSIS — F32A Depression, unspecified: Secondary | ICD-10-CM

## 2018-03-02 DIAGNOSIS — O99342 Other mental disorders complicating pregnancy, second trimester: Secondary | ICD-10-CM

## 2018-03-02 MED FILL — FLUoxetine HCL 20 MG CAPS: 20 | 30 days supply | Qty: 30 | Fill #0

## 2018-03-02 NOTE — Telephone Encounter (Signed)
Please send refill on Fluoxetine 20mg  if approved.  Pt is out of refills.

## 2018-03-05 ENCOUNTER — Encounter: Payer: Self-pay | Admitting: Certified Nurse Midwife

## 2018-03-05 ENCOUNTER — Other Ambulatory Visit: Payer: 59

## 2018-03-05 ENCOUNTER — Ambulatory Visit (INDEPENDENT_AMBULATORY_CARE_PROVIDER_SITE_OTHER): Payer: 59 | Admitting: Certified Nurse Midwife

## 2018-03-05 VITALS — BP 106/74 | HR 83 | Wt 150.2 lb

## 2018-03-05 DIAGNOSIS — O0990 Supervision of high risk pregnancy, unspecified, unspecified trimester: Secondary | ICD-10-CM

## 2018-03-05 DIAGNOSIS — Z3689 Encounter for other specified antenatal screening: Secondary | ICD-10-CM

## 2018-03-05 DIAGNOSIS — O360130 Maternal care for anti-D [Rh] antibodies, third trimester, not applicable or unspecified: Secondary | ICD-10-CM

## 2018-03-05 DIAGNOSIS — O24415 Gestational diabetes mellitus in pregnancy, controlled by oral hypoglycemic drugs: Secondary | ICD-10-CM | POA: Diagnosis not present

## 2018-03-05 DIAGNOSIS — Z113 Encounter for screening for infections with a predominantly sexual mode of transmission: Secondary | ICD-10-CM | POA: Diagnosis not present

## 2018-03-05 DIAGNOSIS — O0993 Supervision of high risk pregnancy, unspecified, third trimester: Secondary | ICD-10-CM

## 2018-03-05 MED ORDER — GLYBURIDE 2.5 MG PO TABS: 2.5000 mg | ORAL_TABLET | Freq: Every day | ORAL | 3 refills | Status: DC

## 2018-03-05 NOTE — Progress Notes (Signed)
Pt presents for ROB/NST/AFI dx: GDM  NST - R AFI - 16.34

## 2018-03-05 NOTE — Progress Notes (Signed)
   PRENATAL VISIT NOTE  Subjective:  Tracy Gallegos is a 32 y.o. G2P1001 at [redacted]w[redacted]d being seen today for ongoing prenatal care.  She is currently monitored for the following issues for this high-risk pregnancy and has Supervision of high risk pregnancy, antepartum; Anti-D antibodies present during pregnancy; Rh negative state in antepartum period; Cystic fibrosis carrier; History of gestational diabetes; and GDM (gestational diabetes mellitus) on their problem list.  Patient reports no bleeding, no leaking, occasional contractions and increased vaginal pressure.  Contractions: Irregular. Vag. Bleeding: None.  Movement: Present. Denies leaking of fluid.   The following portions of the patient's history were reviewed and updated as appropriate: allergies, current medications, past family history, past medical history, past social history, past surgical history and problem list. Problem list updated.  Objective:   Vitals:   03/05/18 1517  BP: 106/74  Pulse: 83  Weight: 150 lb 3.2 oz (68.1 kg)    Fetal Status: Fetal Heart Rate (bpm): NST/AFI Fundal Height: 32 cm Movement: Present  Presentation: Vertex  General:  Alert, oriented and cooperative. Patient is in no acute distress.  Skin: Skin is warm and dry. No rash noted.   Cardiovascular: Normal heart rate noted  Respiratory: Normal respiratory effort, no problems with respiration noted  Abdomen: Soft, gravid, appropriate for gestational age.  Pain/Pressure: Absent     Pelvic: Cervical exam performed Dilation: 1 Effacement (%): 50 Station: -2  Extremities: Normal range of motion.  Edema: None  Mental Status: Normal mood and affect. Normal behavior. Normal judgment and thought content.   Fasting CBGs: 78-94, X2 94            2 hour PP: 98-165, >7 CBGs >120 NST: + accels, no decels, moderate variability, Cat. 1 tracing. No contractions on toco.  AFI: WNL  Assessment and Plan:  Pregnancy: G2P1001 at [redacted]w[redacted]d  1. Gestational diabetes mellitus  (GDM) in third trimester controlled on oral hypoglycemic drug     Glyburide increased to 2.5 mg in AM.  Has growth [place] scheduled.  - Fetal nonstress test: Reactive NST - US OB Limited - glyBURIDE (DIABETA) 2.5 MG tablet; Take 1 tablet (2.5 mg total) by mouth daily with breakfast.  Dispense: 60 tablet; Refill: 3  2. Supervision of high risk pregnancy, antepartum    - Cervicovaginal ancillary only - Strep Gp B NAA  3. Anti-D antibodies present during pregnancy in third trimester, single or unspecified fetus    - Antibody identification; Future  Preterm labor symptoms and general obstetric precautions including but not limited to vaginal bleeding, contractions, leaking of fluid and fetal movement were reviewed in detail with the patient. Please refer to After Visit Summary for other counseling recommendations.  Return in about 1 week (around 03/12/2018) for [facility], Continue Bi-weekly Antenatal testing with weekly AFI.  Future Appointments  Date Time Provider [place]  03/09/2018  1:00 PM [place] None  03/12/2018  1:30 PM [place] None  03/24/2018  3:00 PM [place] 3 [facility]-MFCUS MFC-US  06/30/2018 11:45 AM Lucille Passy, MD [facility]    Morene Crocker, CNM

## 2018-03-06 LAB — CERVICOVAGINAL ANCILLARY ONLY
BACTERIAL VAGINITIS: NEGATIVE
CANDIDA VAGINITIS: NEGATIVE
Chlamydia: NEGATIVE
Neisseria Gonorrhea: NEGATIVE
Trichomonas: NEGATIVE

## 2018-03-07 LAB — STREP GP B NAA: Strep Gp B NAA: POSITIVE — AB

## 2018-03-09 ENCOUNTER — Other Ambulatory Visit: Payer: Self-pay | Admitting: Certified Nurse Midwife

## 2018-03-09 ENCOUNTER — Ambulatory Visit: Payer: 59

## 2018-03-09 DIAGNOSIS — O24415 Gestational diabetes mellitus in pregnancy, controlled by oral hypoglycemic drugs: Secondary | ICD-10-CM

## 2018-03-09 DIAGNOSIS — O99820 Streptococcus B carrier state complicating pregnancy: Secondary | ICD-10-CM | POA: Insufficient documentation

## 2018-03-09 LAB — ANTIBODY IDENTIFICATION

## 2018-03-09 NOTE — Progress Notes (Signed)
Nurse visit for NST only dx: GDM. During visit, pt requested help with perineal massages d/t hx of 3rd degree tear. Consulted with Rachelle and she was able to demonstrate massage for pt.

## 2018-03-10 NOTE — Progress Notes (Signed)
03/09/2018 Patient here for NST due to [facility] NST reviewed and reactive with baseline 140, mod variability, +accels, no decels

## 2018-03-12 ENCOUNTER — Other Ambulatory Visit: Payer: 59

## 2018-03-12 ENCOUNTER — Ambulatory Visit (INDEPENDENT_AMBULATORY_CARE_PROVIDER_SITE_OTHER): Payer: 59 | Admitting: Obstetrics and Gynecology

## 2018-03-12 VITALS — BP 108/65 | HR 80 | Wt 150.0 lb

## 2018-03-12 DIAGNOSIS — O24415 Gestational diabetes mellitus in pregnancy, controlled by oral hypoglycemic drugs: Secondary | ICD-10-CM | POA: Diagnosis not present

## 2018-03-12 DIAGNOSIS — O0990 Supervision of high risk pregnancy, unspecified, unspecified trimester: Secondary | ICD-10-CM

## 2018-03-12 DIAGNOSIS — O99820 Streptococcus B carrier state complicating pregnancy: Secondary | ICD-10-CM

## 2018-03-12 MED ORDER — ACCU-CHEK SOFTCLIX LANCET DEV KIT: PACK | 1 refills | Status: DC

## 2018-03-12 NOTE — Progress Notes (Signed)
   PRENATAL VISIT NOTE  Subjective:  Nataliee Shurtz is a 32 y.o. G2P1001 at [redacted]w[redacted]d being seen today for ongoing prenatal care.  She is currently monitored for the following issues for this high-risk pregnancy and has Supervision of high risk pregnancy, antepartum; Anti-D antibodies present during pregnancy; Rh negative state in antepartum period; Cystic fibrosis carrier; History of gestational diabetes; GDM (gestational diabetes mellitus); and GBS (group B Streptococcus carrier), +RV culture, currently pregnant on their problem list.  Patient reports no complaints.  Contractions: Irregular. Vag. Bleeding: None.  Movement: Present. Denies leaking of fluid.   The following portions of the patient's history were reviewed and updated as appropriate: allergies, current medications, past family history, past medical history, past social history, past surgical history and problem list. Problem list updated.  Objective:   Vitals:   03/12/18 1430  BP: 108/65  Pulse: 80  Weight: 150 lb (68 kg)    Fetal Status: Fetal Heart Rate (bpm): NST/AFI    Movement: Present  Presentation: Vertex  General:  Alert, oriented and cooperative. Patient is in no acute distress.  Skin: Skin is warm and dry. No rash noted.   Cardiovascular: Normal heart rate noted  Respiratory: Normal respiratory effort, no problems with respiration noted  Abdomen: Soft, gravid, appropriate for gestational age.  Pain/Pressure: Present     Pelvic: Cervical exam deferred        Extremities: Normal range of motion.  Edema: None  Mental Status: Normal mood and affect. Normal behavior. Normal judgment and thought content.   Assessment and Plan:  Pregnancy: G2P1001 at [redacted]w[redacted]d  1. Gestational diabetes mellitus (GDM) in third trimester controlled on oral hypoglycemic drug CBGs reviewed and great majority within range. All fasting within range. Occasional postprandial as high as 154 Continue glyburide 2.5 mg in am. Patient taking 1.6mg   glyburide in evening Follow up growth ultrasound NST reviewed and reactive with baseline 135, mod variability, +accels, no decels - US OB Limited - Fetal nonstress test  2. Supervision of high risk pregnancy, antepartum Patient is doing well without complaints  3. GBS (group B Streptococcus carrier), +RV culture, currently pregnant Will provide prophylaxis in laboe  Preterm labor symptoms and general obstetric precautions including but not limited to vaginal bleeding, contractions, leaking of fluid and fetal movement were reviewed in detail with the patient. Please refer to After Visit Summary for other counseling recommendations.  Return in about 1 week (around 03/19/2018) for ROB, NST.  Future Appointments  Date Time Provider [place]  03/16/2018  1:00 PM [place] None  03/19/2018  2:15 PM Sloan Leiter, MD [facility] None  03/19/2018  2:30 PM [place] None  03/24/2018  3:00 PM [place] 3 WH-MFCUS MFC-US  06/30/2018 11:45 AM Lucille Passy, MD [facility]    Mora Bellman, MD

## 2018-03-16 ENCOUNTER — Ambulatory Visit (INDEPENDENT_AMBULATORY_CARE_PROVIDER_SITE_OTHER): Payer: 59

## 2018-03-16 VITALS — BP 130/80 | HR 80 | Wt 149.5 lb

## 2018-03-16 DIAGNOSIS — O24415 Gestational diabetes mellitus in pregnancy, controlled by oral hypoglycemic drugs: Secondary | ICD-10-CM | POA: Diagnosis not present

## 2018-03-16 MED FILL — FREESTYLE LITE TEST STRIP: 17 days supply | Qty: 100 | Fill #1

## 2018-03-16 NOTE — Progress Notes (Signed)
NST only dx: GDM

## 2018-03-16 NOTE — Progress Notes (Signed)
Patient with GDMA2-glyburide here for NST NST reviewed and reactive with baseline 130, mod variability, +accels, no decels

## 2018-03-19 ENCOUNTER — Ambulatory Visit (INDEPENDENT_AMBULATORY_CARE_PROVIDER_SITE_OTHER): Payer: 59 | Admitting: Obstetrics and Gynecology

## 2018-03-19 ENCOUNTER — Other Ambulatory Visit: Payer: 59

## 2018-03-19 ENCOUNTER — Encounter: Payer: Self-pay | Admitting: Obstetrics and Gynecology

## 2018-03-19 VITALS — BP 114/76 | HR 75 | Wt 150.0 lb

## 2018-03-19 DIAGNOSIS — Z8632 Personal history of gestational diabetes: Secondary | ICD-10-CM

## 2018-03-19 DIAGNOSIS — O24415 Gestational diabetes mellitus in pregnancy, controlled by oral hypoglycemic drugs: Secondary | ICD-10-CM

## 2018-03-19 DIAGNOSIS — O360130 Maternal care for anti-D [Rh] antibodies, third trimester, not applicable or unspecified: Secondary | ICD-10-CM

## 2018-03-19 DIAGNOSIS — Z141 Cystic fibrosis carrier: Secondary | ICD-10-CM

## 2018-03-19 DIAGNOSIS — O09299 Supervision of pregnancy with other poor reproductive or obstetric history, unspecified trimester: Secondary | ICD-10-CM | POA: Insufficient documentation

## 2018-03-19 DIAGNOSIS — O0990 Supervision of high risk pregnancy, unspecified, unspecified trimester: Secondary | ICD-10-CM

## 2018-03-19 DIAGNOSIS — Z6791 Unspecified blood type, Rh negative: Secondary | ICD-10-CM

## 2018-03-19 DIAGNOSIS — O99820 Streptococcus B carrier state complicating pregnancy: Secondary | ICD-10-CM

## 2018-03-19 DIAGNOSIS — O26893 Other specified pregnancy related conditions, third trimester: Secondary | ICD-10-CM

## 2018-03-19 DIAGNOSIS — O26899 Other specified pregnancy related conditions, unspecified trimester: Secondary | ICD-10-CM

## 2018-03-19 DIAGNOSIS — O0993 Supervision of high risk pregnancy, unspecified, third trimester: Secondary | ICD-10-CM

## 2018-03-19 DIAGNOSIS — O09293 Supervision of pregnancy with other poor reproductive or obstetric history, third trimester: Secondary | ICD-10-CM

## 2018-03-19 NOTE — Progress Notes (Signed)
   PRENATAL VISIT NOTE  Subjective:  Tracy Gallegos is a 32 y.o. G2P1001 at [redacted]w[redacted]d being seen today for ongoing prenatal care.  She is currently monitored for the following issues for this high-risk pregnancy and has Supervision of high risk pregnancy, antepartum; Anti-D antibodies present during pregnancy; Rh negative state in antepartum period; Cystic fibrosis carrier; History of gestational diabetes; GDM (gestational diabetes mellitus); GBS (group B Streptococcus carrier), +RV culture, currently pregnant; and H/O maternal fourth degree perineal laceration, currently pregnant on their problem list.  Patient reports vaginal pressure.  Contractions: Irregular. Vag. Bleeding: None.  Movement: Present. Denies leaking of fluid.   The following portions of the patient's history were reviewed and updated as appropriate: allergies, current medications, past family history, past medical history, past social history, past surgical history and problem list. Problem list updated.  Objective:   Vitals:   03/19/18 1433  BP: 114/76  Pulse: 75  Weight: 150 lb (68 kg)    Fetal Status: Fetal Heart Rate (bpm): NST/AFI   Movement: Present  Presentation: Vertex  General:  Alert, oriented and cooperative. Patient is in no acute distress.  Skin: Skin is warm and dry. No rash noted.   Cardiovascular: Normal heart rate noted  Respiratory: Normal respiratory effort, no problems with respiration noted  Abdomen: Soft, gravid, appropriate for gestational age.  Pain/Pressure: Present     Pelvic: Cervical exam performed        Extremities: Normal range of motion.  Edema: None  Mental Status: Normal mood and affect. Normal behavior. Normal judgment and thought content.   Assessment and Plan:  Pregnancy: G2P1001 at [redacted]w[redacted]d  1. Gestational diabetes mellitus (GDM) in third trimester controlled on oral hypoglycemic drug 2.5 qam, 1.875 qhs  FG: 79-91 PP: 93-130s, occasional 170  Cont current regimen - US OB Limited;  Future - Fetal nonstress test Next growth 5/10  2. Supervision of high risk pregnancy, antepartum  3. Anti-D antibodies present during pregnancy in third trimester, single or unspecified fetus Last 4/25 too weak to titer  4. GBS (group B Streptococcus carrier), +RV culture, currently pregnant ppx in labor  5. Rh negative state in antepartum period  6. History of gestational diabetes  7. Cystic fibrosis carrier  8. H/O maternal fourth degree perineal laceration, currently pregnant 1st baby was 45'14" Currently declines primary c-section, next growth 5/10   Preterm labor symptoms and general obstetric precautions including but not limited to vaginal bleeding, contractions, leaking of fluid and fetal movement were reviewed in detail with the patient. Please refer to After Visit Summary for other counseling recommendations.  Return in about 1 week (around 03/26/2018) for OB visit (MD).  Future Appointments  Date Time Provider [place]  03/20/2018  1:30 PM [facility] [place] 1 [facility]-MFCUS MFC-US  03/23/2018  2:00 PM [place] None  06/30/2018 11:45 AM Lucille Passy, MD [facility]    Sloan Leiter, MD

## 2018-03-20 ENCOUNTER — Telehealth: Payer: Self-pay

## 2018-03-20 ENCOUNTER — Other Ambulatory Visit: Payer: Self-pay | Admitting: Obstetrics

## 2018-03-20 ENCOUNTER — Ambulatory Visit (HOSPITAL_COMMUNITY)
Admission: RE | Admit: 2018-03-20 | Discharge: 2018-03-20 | Disposition: A | Discharge status: Home / Self Care | Payer: 59 | Source: Ambulatory Visit | Attending: Certified Nurse Midwife | Admitting: Certified Nurse Midwife

## 2018-03-20 ENCOUNTER — Encounter (HOSPITAL_COMMUNITY): Payer: Self-pay

## 2018-03-20 ENCOUNTER — Other Ambulatory Visit: Payer: Self-pay | Admitting: Certified Nurse Midwife

## 2018-03-20 DIAGNOSIS — O24415 Gestational diabetes mellitus in pregnancy, controlled by oral hypoglycemic drugs: Secondary | ICD-10-CM | POA: Diagnosis not present

## 2018-03-20 DIAGNOSIS — O09893 Supervision of other high risk pregnancies, third trimester: Secondary | ICD-10-CM | POA: Insufficient documentation

## 2018-03-20 DIAGNOSIS — O09299 Supervision of pregnancy with other poor reproductive or obstetric history, unspecified trimester: Secondary | ICD-10-CM

## 2018-03-20 DIAGNOSIS — O24410 Gestational diabetes mellitus in pregnancy, diet controlled: Secondary | ICD-10-CM

## 2018-03-20 DIAGNOSIS — Z3A37 37 weeks gestation of pregnancy: Secondary | ICD-10-CM

## 2018-03-20 DIAGNOSIS — Z362 Encounter for other antenatal screening follow-up: Secondary | ICD-10-CM

## 2018-03-20 DIAGNOSIS — O0990 Supervision of high risk pregnancy, unspecified, unspecified trimester: Secondary | ICD-10-CM

## 2018-03-20 DIAGNOSIS — O09293 Supervision of pregnancy with other poor reproductive or obstetric history, third trimester: Secondary | ICD-10-CM | POA: Diagnosis not present

## 2018-03-20 MED ORDER — GLYBURIDE 2.5 MG PO TABS: 2.5000 mg | ORAL_TABLET | Freq: Every day | ORAL | 5 refills | Status: DC

## 2018-03-20 MED FILL — glyBURIDE 2.5 MG TABS: 2.5 | 60 days supply | Qty: 60 | Fill #0

## 2018-03-20 NOTE — Telephone Encounter (Signed)
Contacted pharmacy to clarify confusion with pt glyburide rx. Informed that since dosage of 1.25 increased to 1.875, it is too early to refill. Advised would have to follow up with provider. Pharmacist advised that they would inform pt.

## 2018-03-20 NOTE — Telephone Encounter (Signed)
Advised pt that provider would send new rx,.

## 2018-03-23 ENCOUNTER — Ambulatory Visit (INDEPENDENT_AMBULATORY_CARE_PROVIDER_SITE_OTHER): Payer: 59 | Admitting: *Deleted

## 2018-03-23 VITALS — BP 116/77 | HR 73 | Wt 150.0 lb

## 2018-03-23 DIAGNOSIS — O24415 Gestational diabetes mellitus in pregnancy, controlled by oral hypoglycemic drugs: Secondary | ICD-10-CM

## 2018-03-23 DIAGNOSIS — O09299 Supervision of pregnancy with other poor reproductive or obstetric history, unspecified trimester: Secondary | ICD-10-CM

## 2018-03-23 NOTE — Progress Notes (Signed)
I have reviewed the chart and agree with nursing staff's documentation of this patient's encounter.  Morene Crocker, CNM 03/23/2018 4:11 PM

## 2018-03-23 NOTE — Progress Notes (Signed)
Pt is in office for NST due to GDM. NST reviewed with R.Denney, CNM- approved to remove from monitor. Pt has next appt for this week. Pt has no other concerns today.

## 2018-03-24 ENCOUNTER — Ambulatory Visit (HOSPITAL_COMMUNITY): Payer: 59

## 2018-03-26 ENCOUNTER — Ambulatory Visit (INDEPENDENT_AMBULATORY_CARE_PROVIDER_SITE_OTHER): Payer: 59 | Admitting: Obstetrics and Gynecology

## 2018-03-26 ENCOUNTER — Other Ambulatory Visit: Payer: 59

## 2018-03-26 ENCOUNTER — Encounter: Payer: Self-pay | Admitting: Obstetrics and Gynecology

## 2018-03-26 VITALS — BP 114/75 | HR 85 | Wt 151.3 lb

## 2018-03-26 DIAGNOSIS — O26899 Other specified pregnancy related conditions, unspecified trimester: Secondary | ICD-10-CM

## 2018-03-26 DIAGNOSIS — Z8632 Personal history of gestational diabetes: Secondary | ICD-10-CM

## 2018-03-26 DIAGNOSIS — O26893 Other specified pregnancy related conditions, third trimester: Secondary | ICD-10-CM

## 2018-03-26 DIAGNOSIS — O0990 Supervision of high risk pregnancy, unspecified, unspecified trimester: Secondary | ICD-10-CM

## 2018-03-26 DIAGNOSIS — O360130 Maternal care for anti-D [Rh] antibodies, third trimester, not applicable or unspecified: Secondary | ICD-10-CM

## 2018-03-26 DIAGNOSIS — O99820 Streptococcus B carrier state complicating pregnancy: Secondary | ICD-10-CM

## 2018-03-26 DIAGNOSIS — O09293 Supervision of pregnancy with other poor reproductive or obstetric history, third trimester: Secondary | ICD-10-CM

## 2018-03-26 DIAGNOSIS — O24415 Gestational diabetes mellitus in pregnancy, controlled by oral hypoglycemic drugs: Secondary | ICD-10-CM

## 2018-03-26 DIAGNOSIS — O0993 Supervision of high risk pregnancy, unspecified, third trimester: Secondary | ICD-10-CM

## 2018-03-26 DIAGNOSIS — O09299 Supervision of pregnancy with other poor reproductive or obstetric history, unspecified trimester: Secondary | ICD-10-CM

## 2018-03-26 DIAGNOSIS — Z6791 Unspecified blood type, Rh negative: Secondary | ICD-10-CM

## 2018-03-26 NOTE — Progress Notes (Signed)
Pt presents for NST/AFI dx: GDM. She requests to discuss c/s versus vaginal delivery d/t hx of 4th degree tear.

## 2018-03-26 NOTE — Patient Instructions (Signed)
Start taking colace (docusate sodium) now, you may buy it over the counter. Take 100 mg by mouth twice per day.

## 2018-03-26 NOTE — Progress Notes (Signed)
   PRENATAL VISIT NOTE  Subjective:  Tracy Gallegos is a 32 y.o. G2P1001 at [redacted]w[redacted]d being seen today for ongoing prenatal care.  She is currently monitored for the following issues for this high-risk pregnancy and has Supervision of high risk pregnancy, antepartum; Anti-D antibodies present during pregnancy; Rh negative state in antepartum period; Cystic fibrosis carrier; History of gestational diabetes; GDM (gestational diabetes mellitus); GBS (group B Streptococcus carrier), +RV culture, currently pregnant; and H/O maternal fourth degree perineal laceration, currently pregnant on their problem list.  Patient reports no complaints.  Contractions: Irregular.  .  Movement: Present. Denies leaking of fluid.   The following portions of the patient's history were reviewed and updated as appropriate: allergies, current medications, past family history, past medical history, past social history, past surgical history and problem list. Problem list updated.  Objective:   Vitals:   03/26/18 1356  BP: 114/75  Pulse: 85  Weight: 151 lb 4.8 oz (68.6 kg)    Fetal Status: Fetal Heart Rate (bpm): NST/AFI   Movement: Present     General:  Alert, oriented and cooperative. Patient is in no acute distress.  Skin: Skin is warm and dry. No rash noted.   Cardiovascular: Normal heart rate noted  Respiratory: Normal respiratory effort, no problems with respiration noted  Abdomen: Soft, gravid, appropriate for gestational age.  Pain/Pressure: Absent     Pelvic: Cervical exam deferred      2/40/-1  Extremities: Normal range of motion.  Edema: None  Mental Status: Normal mood and affect. Normal behavior. Normal judgment and thought content.   Assessment and Plan:  Pregnancy: G2P1001 at [redacted]w[redacted]d  1. Gestational diabetes mellitus (GDM) in third trimester controlled on oral hypoglycemic drug 2.5 qam, 1.875 qhs  FG: 70-80s PP: most under <120 Cont current regimen - Fetal nonstress test NST reactive AFI today  24.0 cm cephalic  2. Supervision of high risk pregnancy, antepartum  3. Rh negative state in antepartum period  4. Anti-D antibodies present during pregnancy in third trimester, single or unspecified fetus Too weak to titer  5. History of gestational diabetes  6. GBS (group B Streptococcus carrier), +RV culture, currently pregnant ppx in labor  7. H/O maternal fourth degree perineal laceration, currently pregnant After extensive discussion regarding risks/benefits, patient would like trial of labor.  IOL scheduled for 39 weeks  Term labor symptoms and general obstetric precautions including but not limited to vaginal bleeding, contractions, leaking of fluid and fetal movement were reviewed in detail with the patient. Please refer to After Visit Summary for other counseling recommendations.  Return in about 1 week (around 04/02/2018) for post partum check.  Future Appointments  Date Time Provider [place]  03/30/2018  1:00 PM [place] None  04/02/2018  1:30 PM Chancy Milroy, MD [place] None  04/02/2018  1:45 PM [place] None  04/03/2018  6:30 AM [facility] ROOM [facility] None  06/30/2018 11:45 AM Lucille Passy, MD [facility]    Sloan Leiter, MD

## 2018-03-27 ENCOUNTER — Telehealth (HOSPITAL_COMMUNITY): Payer: Self-pay | Admitting: *Deleted

## 2018-03-27 NOTE — Telephone Encounter (Signed)
Preadmission screen  

## 2018-03-30 ENCOUNTER — Other Ambulatory Visit: Payer: 59

## 2018-03-30 ENCOUNTER — Encounter (HOSPITAL_COMMUNITY): Payer: Self-pay

## 2018-03-30 ENCOUNTER — Inpatient Hospital Stay (HOSPITAL_COMMUNITY): Payer: 59 | Admitting: Anesthesiology

## 2018-03-30 ENCOUNTER — Inpatient Hospital Stay (HOSPITAL_COMMUNITY)
Admission: AD | Admit: 2018-03-30 | Discharge: 2018-04-01 | DRG: 807 | Disposition: A | Discharge status: Home / Self Care | Payer: 59 | Source: Ambulatory Visit | Attending: Obstetrics & Gynecology | Admitting: Obstetrics & Gynecology

## 2018-03-30 DIAGNOSIS — O360190 Maternal care for anti-D [Rh] antibodies, unspecified trimester, not applicable or unspecified: Secondary | ICD-10-CM | POA: Diagnosis present

## 2018-03-30 DIAGNOSIS — O99342 Other mental disorders complicating pregnancy, second trimester: Secondary | ICD-10-CM

## 2018-03-30 DIAGNOSIS — Z6791 Unspecified blood type, Rh negative: Secondary | ICD-10-CM | POA: Diagnosis not present

## 2018-03-30 DIAGNOSIS — Z141 Cystic fibrosis carrier: Secondary | ICD-10-CM | POA: Diagnosis not present

## 2018-03-30 DIAGNOSIS — O9962 Diseases of the digestive system complicating childbirth: Secondary | ICD-10-CM | POA: Diagnosis present

## 2018-03-30 DIAGNOSIS — O26893 Other specified pregnancy related conditions, third trimester: Secondary | ICD-10-CM | POA: Diagnosis present

## 2018-03-30 DIAGNOSIS — F419 Anxiety disorder, unspecified: Secondary | ICD-10-CM | POA: Diagnosis present

## 2018-03-30 DIAGNOSIS — Z3A38 38 weeks gestation of pregnancy: Secondary | ICD-10-CM

## 2018-03-30 DIAGNOSIS — O99344 Other mental disorders complicating childbirth: Secondary | ICD-10-CM | POA: Diagnosis present

## 2018-03-30 DIAGNOSIS — F329 Major depressive disorder, single episode, unspecified: Secondary | ICD-10-CM | POA: Diagnosis present

## 2018-03-30 DIAGNOSIS — O09299 Supervision of pregnancy with other poor reproductive or obstetric history, unspecified trimester: Secondary | ICD-10-CM

## 2018-03-30 DIAGNOSIS — K219 Gastro-esophageal reflux disease without esophagitis: Secondary | ICD-10-CM | POA: Diagnosis present

## 2018-03-30 DIAGNOSIS — O26899 Other specified pregnancy related conditions, unspecified trimester: Secondary | ICD-10-CM

## 2018-03-30 DIAGNOSIS — O24429 Gestational diabetes mellitus in childbirth, unspecified control: Secondary | ICD-10-CM | POA: Diagnosis not present

## 2018-03-30 DIAGNOSIS — O0990 Supervision of high risk pregnancy, unspecified, unspecified trimester: Secondary | ICD-10-CM

## 2018-03-30 DIAGNOSIS — O99820 Streptococcus B carrier state complicating pregnancy: Secondary | ICD-10-CM

## 2018-03-30 DIAGNOSIS — O323XX0 Maternal care for face, brow and chin presentation, not applicable or unspecified: Secondary | ICD-10-CM | POA: Diagnosis present

## 2018-03-30 DIAGNOSIS — O24419 Gestational diabetes mellitus in pregnancy, unspecified control: Secondary | ICD-10-CM | POA: Diagnosis present

## 2018-03-30 DIAGNOSIS — O24425 Gestational diabetes mellitus in childbirth, controlled by oral hypoglycemic drugs: Secondary | ICD-10-CM | POA: Diagnosis present

## 2018-03-30 DIAGNOSIS — O99824 Streptococcus B carrier state complicating childbirth: Secondary | ICD-10-CM | POA: Diagnosis present

## 2018-03-30 DIAGNOSIS — F32A Depression, unspecified: Secondary | ICD-10-CM

## 2018-03-30 HISTORY — DX: Personal history of gestational diabetes: Z86.32

## 2018-03-30 LAB — CBC
HEMATOCRIT: 39.6 % (ref 36.0–46.0)
HEMOGLOBIN: 14 g/dL (ref 12.0–15.0)
MCH: 32.6 pg (ref 26.0–34.0)
MCHC: 35.4 g/dL (ref 30.0–36.0)
MCV: 92.1 fL (ref 78.0–100.0)
Platelets: 175 10*3/uL (ref 150–400)
RBC: 4.3 MIL/uL (ref 3.87–5.11)
RDW: 13.1 % (ref 11.5–15.5)
WBC: 13.8 10*3/uL — AB (ref 4.0–10.5)

## 2018-03-30 LAB — GLUCOSE, CAPILLARY: GLUCOSE-CAPILLARY: 101 mg/dL — AB (ref 65–99)

## 2018-03-30 LAB — RPR: RPR Ser Ql: NONREACTIVE

## 2018-03-30 MED ORDER — ZOLPIDEM TARTRATE 5 MG PO TABS: 5.0000 mg | ORAL_TABLET | Freq: Every evening | ORAL | Status: DC | PRN

## 2018-03-30 MED ORDER — PHENYLEPHRINE 40 MCG/ML (10ML) SYRINGE FOR IV PUSH (FOR BLOOD PRESSURE SUPPORT)
80.0000 ug | PREFILLED_SYRINGE | INTRAVENOUS | Status: DC | PRN
  Filled 2018-03-30: qty 5

## 2018-03-30 MED ORDER — ONDANSETRON HCL 4 MG/2ML IJ SOLN: 4.0000 mg | Freq: Four times a day (QID) | INTRAMUSCULAR | Status: DC | PRN

## 2018-03-30 MED ORDER — METHYLERGONOVINE MALEATE 0.2 MG/ML IJ SOLN: 0.2000 mg | INTRAMUSCULAR | Status: DC | PRN

## 2018-03-30 MED ORDER — MISOPROSTOL 25 MCG QUARTER TABLET: 25.0000 ug | ORAL_TABLET | ORAL | Status: DC | PRN

## 2018-03-30 MED ORDER — SIMETHICONE 80 MG PO CHEW: 80.0000 mg | CHEWABLE_TABLET | ORAL | Status: DC | PRN

## 2018-03-30 MED ORDER — IBUPROFEN 600 MG PO TABS
600.0000 mg | ORAL_TABLET | Freq: Four times a day (QID) | ORAL | Status: DC
  Administered 2018-03-30 – 2018-04-01 (×10): 600 mg via ORAL
  Filled 2018-03-30 (×10): qty 1

## 2018-03-30 MED ORDER — SODIUM CHLORIDE 0.9 % IV SOLN
2.0000 g | Freq: Once | INTRAVENOUS | Status: AC
  Administered 2018-03-30: 2 g via INTRAVENOUS
  Filled 2018-03-30: qty 2

## 2018-03-30 MED ORDER — EPHEDRINE 5 MG/ML INJ
10.0000 mg | INTRAVENOUS | Status: DC | PRN
  Filled 2018-03-30: qty 2

## 2018-03-30 MED ORDER — LACTATED RINGERS IV SOLN: 500.0000 mL | Freq: Once | INTRAVENOUS | Status: DC

## 2018-03-30 MED ORDER — BENZOCAINE-MENTHOL 20-0.5 % EX AERO
1.0000 "application " | INHALATION_SPRAY | CUTANEOUS | Status: DC | PRN
  Filled 2018-03-30: qty 56

## 2018-03-30 MED ORDER — OXYCODONE-ACETAMINOPHEN 5-325 MG PO TABS
1.0000 | ORAL_TABLET | ORAL | Status: DC | PRN
  Administered 2018-03-30 – 2018-04-01 (×5): 1 via ORAL
  Filled 2018-03-30 (×5): qty 1

## 2018-03-30 MED ORDER — LIDOCAINE HCL (PF) 1 % IJ SOLN
INTRAMUSCULAR | Status: AC
  Filled 2018-03-30: qty 30

## 2018-03-30 MED ORDER — SENNOSIDES-DOCUSATE SODIUM 8.6-50 MG PO TABS
2.0000 | ORAL_TABLET | ORAL | Status: DC
  Administered 2018-03-30 – 2018-03-31 (×2): 2 via ORAL
  Filled 2018-03-30 (×2): qty 2

## 2018-03-30 MED ORDER — LIDOCAINE HCL (PF) 1 % IJ SOLN
30.0000 mL | INTRAMUSCULAR | Status: AC | PRN
  Administered 2018-03-30: 30 mL via SUBCUTANEOUS
  Filled 2018-03-30: qty 30

## 2018-03-30 MED ORDER — OXYTOCIN 40 UNITS IN LACTATED RINGERS INFUSION - SIMPLE MED
2.5000 [IU]/h | INTRAVENOUS | Status: DC
  Administered 2018-03-30: 2.5 [IU]/h via INTRAVENOUS

## 2018-03-30 MED ORDER — WITCH HAZEL-GLYCERIN EX PADS: 1.0000 "application " | MEDICATED_PAD | CUTANEOUS | Status: DC | PRN

## 2018-03-30 MED ORDER — OXYCODONE-ACETAMINOPHEN 5-325 MG PO TABS
2.0000 | ORAL_TABLET | ORAL | Status: DC | PRN
  Filled 2018-03-30: qty 2

## 2018-03-30 MED ORDER — DIPHENHYDRAMINE HCL 25 MG PO CAPS: 25.0000 mg | ORAL_CAPSULE | Freq: Four times a day (QID) | ORAL | Status: DC | PRN

## 2018-03-30 MED ORDER — SOD CITRATE-CITRIC ACID 500-334 MG/5ML PO SOLN
30.0000 mL | ORAL | Status: DC | PRN
  Filled 2018-03-30: qty 15

## 2018-03-30 MED ORDER — ACETAMINOPHEN 325 MG PO TABS
650.0000 mg | ORAL_TABLET | ORAL | Status: DC | PRN
  Administered 2018-03-30: 650 mg via ORAL
  Filled 2018-03-30: qty 2

## 2018-03-30 MED ORDER — ACETAMINOPHEN 325 MG PO TABS
650.0000 mg | ORAL_TABLET | ORAL | Status: DC | PRN
  Administered 2018-03-31 – 2018-04-01 (×2): 650 mg via ORAL
  Filled 2018-03-30 (×2): qty 2

## 2018-03-30 MED ORDER — OXYTOCIN 40 UNITS IN LACTATED RINGERS INFUSION - SIMPLE MED
INTRAVENOUS | Status: AC
  Administered 2018-03-30: 500 mL via INTRAVENOUS
  Filled 2018-03-30: qty 1000

## 2018-03-30 MED ORDER — OXYTOCIN BOLUS FROM INFUSION
500.0000 mL | Freq: Once | INTRAVENOUS | Status: AC
  Administered 2018-03-30: 500 mL via INTRAVENOUS

## 2018-03-30 MED ORDER — ONDANSETRON HCL 4 MG PO TABS: 4.0000 mg | ORAL_TABLET | ORAL | Status: DC | PRN

## 2018-03-30 MED ORDER — PHENYLEPHRINE 40 MCG/ML (10ML) SYRINGE FOR IV PUSH (FOR BLOOD PRESSURE SUPPORT)
PREFILLED_SYRINGE | INTRAVENOUS | Status: AC
  Filled 2018-03-30: qty 20

## 2018-03-30 MED ORDER — TETANUS-DIPHTH-ACELL PERTUSSIS 5-2.5-18.5 LF-MCG/0.5 IM SUSP: 0.5000 mL | Freq: Once | INTRAMUSCULAR | Status: DC

## 2018-03-30 MED ORDER — LACTATED RINGERS IV SOLN
INTRAVENOUS | Status: DC
  Administered 2018-03-30: 03:00:00 via INTRAVENOUS

## 2018-03-30 MED ORDER — ONDANSETRON HCL 4 MG/2ML IJ SOLN: 4.0000 mg | INTRAMUSCULAR | Status: DC | PRN

## 2018-03-30 MED ORDER — METHYLERGONOVINE MALEATE 0.2 MG PO TABS: 0.2000 mg | ORAL_TABLET | ORAL | Status: DC | PRN

## 2018-03-30 MED ORDER — TERBUTALINE SULFATE 1 MG/ML IJ SOLN
0.2500 mg | Freq: Once | INTRAMUSCULAR | Status: DC | PRN
  Filled 2018-03-30: qty 1

## 2018-03-30 MED ORDER — COCONUT OIL OIL: 1.0000 "application " | TOPICAL_OIL | Status: DC | PRN

## 2018-03-30 MED ORDER — PRENATAL MULTIVITAMIN CH: 1.0000 | ORAL_TABLET | Freq: Every day | ORAL | Status: DC

## 2018-03-30 MED ORDER — FENTANYL CITRATE (PF) 100 MCG/2ML IJ SOLN
100.0000 ug | INTRAMUSCULAR | Status: DC | PRN
  Administered 2018-03-30: 100 ug via INTRAVENOUS

## 2018-03-30 MED ORDER — DIBUCAINE 1 % RE OINT: 1.0000 "application " | TOPICAL_OINTMENT | RECTAL | Status: DC | PRN

## 2018-03-30 MED ORDER — FENTANYL CITRATE (PF) 100 MCG/2ML IJ SOLN
INTRAMUSCULAR | Status: AC
  Filled 2018-03-30: qty 2

## 2018-03-30 MED ORDER — FENTANYL 2.5 MCG/ML BUPIVACAINE 1/10 % EPIDURAL INFUSION (WH - ANES)
INTRAMUSCULAR | Status: AC
  Filled 2018-03-30: qty 100

## 2018-03-30 MED ORDER — FLUOXETINE HCL 20 MG PO CAPS
20.0000 mg | ORAL_CAPSULE | Freq: Every day | ORAL | Status: DC
  Administered 2018-03-30 – 2018-04-01 (×3): 20 mg via ORAL
  Filled 2018-03-30 (×4): qty 1

## 2018-03-30 MED ORDER — LIDOCAINE HCL (PF) 1 % IJ SOLN
INTRAMUSCULAR | Status: DC | PRN
  Administered 2018-03-30 (×3): 5 mL via EPIDURAL

## 2018-03-30 MED ORDER — PRENATAL MULTIVITAMIN CH
1.0000 | ORAL_TABLET | Freq: Every day | ORAL | Status: DC
  Administered 2018-03-30 – 2018-04-01 (×3): 1 via ORAL
  Filled 2018-03-30 (×3): qty 1

## 2018-03-30 MED ORDER — LACTATED RINGERS IV SOLN: 500.0000 mL | INTRAVENOUS | Status: DC | PRN

## 2018-03-30 MED ORDER — FENTANYL 2.5 MCG/ML BUPIVACAINE 1/10 % EPIDURAL INFUSION (WH - ANES)
INTRAMUSCULAR | Status: DC | PRN
  Administered 2018-03-30: 14 mL/h via EPIDURAL

## 2018-03-30 NOTE — MAU Note (Signed)
Pt reports contractions every few mins. Denies LOF or vag bleeding. Reports good fetal movement. States cervix was 2cm on Thursday.

## 2018-03-30 NOTE — Progress Notes (Signed)
Pt requests RN round every 3hrs instead of every 2hrs. Pt states she will call out with any requests. Will continue to monitor.

## 2018-03-30 NOTE — Anesthesia Procedure Notes (Signed)
Epidural Patient location during procedure: OB Start time: 03/30/2018 3:05 AM End time: 03/30/2018 3:24 AM  Staffing Anesthesiologist: Annye Asa, MD Performed: anesthesiologist   Preanesthetic Checklist Completed: patient identified, surgical consent, pre-op evaluation, timeout performed, IV checked, risks and benefits discussed and monitors and equipment checked  Epidural Patient position: sitting Prep: site prepped and draped and DuraPrep Patient monitoring: blood pressure, continuous pulse ox and heart rate Approach: midline Location: L3-L4 Injection technique: LOR air  Needle:  Needle type: Tuohy  Needle gauge: 17 G Needle length: 9 cm Needle insertion depth: 4 cm Catheter type: closed end flexible Catheter size: 19 Gauge Catheter at skin depth: 9 cm Test dose: negative (1% lidocaine)  Assessment Events: blood not aspirated, injection not painful, no injection resistance, negative IV test and no paresthesia  Additional Notes Pt identified in Labor room.  Monitors applied. Working IV access confirmed. Sterile prep, drape lumbar spine.  1% lido local L 3,4.  #17ga Touhy LOR air at 4 cm L 3,4, cath in easily to 9 cm skin. Test dose OK, cath dosed and infusion begun.  Patient asymptomatic, VSS, no heme aspirated, tolerated well.  Jenita Seashore, MDReason for block:procedure for pain

## 2018-03-30 NOTE — H&P (Signed)
LABOR AND DELIVERY ADMISSION HISTORY AND PHYSICAL NOTE  Tracy Gallegos is a 32 y.o. female G2P1001 with IUP at 11w4dby LMP presenting for SOL.  She reports positive fetal movement. She denies leakage of fluid or vaginal bleeding.  Prenatal History/Complications: PNC at [facility]  Pregnancy complications:  - Gestational diabetes mellitus- oral medication  -Rh negative state  - Anti-D antibodies present during pregnancy  -H/O maternal fourth degree perineal laceration   Past Medical History: Past Medical History:  Diagnosis Date  . Depression   . Endometriosis   . Ovarian cyst    Bilateral    Past Surgical History: Past Surgical History:  Procedure Laterality Date  . OVARIAN CYST SURGERY    . WISDOM TOOTH EXTRACTION      Obstetrical History: OB History    Gravida  2   Para  1   Term  1   Preterm      AB      Living  1     SAB      TAB      Ectopic      Multiple      Live Births  1           Social History: Social History   Socioeconomic History  . Marital status: Married    Spouse name: Not on file  . Number of children: Not on file  . Years of education: Not on file  . Highest education level: Not on file  Occupational History  . Not on file  Social Needs  . Financial resource strain: Not on file  . Food insecurity:    Worry: Not on file    Inability: Not on file  . Transportation needs:    Medical: Not on file    Non-medical: Not on file  Tobacco Use  . Smoking status: Never Smoker  . Smokeless tobacco: Never Used  Substance and Sexual Activity  . Alcohol use: No  . Drug use: No  . Sexual activity: Yes    Birth control/protection: None  Lifestyle  . Physical activity:    Days per week: Not on file    Minutes per session: Not on file  . Stress: Not on file  Relationships  . Social connections:    Talks on phone: Not on file    Gets together: Not on file    Attends religious service: Not on file    Active member of club  or organization: Not on file    Attends meetings of clubs or organizations: Not on file    Relationship status: Not on file  Other Topics Concern  . Not on file  Social History Narrative  . Not on file    Family History: Family History  Problem Relation Age of Onset  . Hypertension Mother   . Obesity Mother   . Depression Mother   . Alcohol abuse Mother   . Heart disease Father   . Early death Father   . Obesity Brother   . Alcohol abuse Maternal Uncle   . Depression Maternal Uncle   . Hypertension Maternal Grandmother   . Depression Maternal Grandmother   . Alcohol abuse Maternal Grandmother   . Alzheimer's disease Maternal Grandmother   . Depression Maternal Grandfather   . Stroke Maternal Grandfather   . Heart disease Maternal Grandfather   . Suicidality Maternal Grandfather   . Suicidality Paternal Grandmother   . Depression Paternal Grandmother   . Hearing loss Paternal Grandfather  Allergies: Allergies  Allergen Reactions  . Nitrous Oxide Nausea And Vomiting    Patient would like to try during labor. States it has been 20 years since she experienced this.    Medications Prior to Admission  Medication Sig Dispense Refill Last Dose  . Doxylamine-Pyridoxine (DICLEGIS) 10-10 MG TBEC Take 1 tablet with breakfast and lunch.  Take 2 tablets at bedtime. 100 tablet 4 03/29/2018 at Unknown time  . FLUoxetine (PROZAC) 20 MG capsule TAKE 1 CAPSULE BY MOUTH DAILY 30 capsule 1 03/29/2018 at Unknown time  . omeprazole (PRILOSEC) 20 MG capsule Take 20 mg by mouth daily.   03/29/2018 at Unknown time  . Prenatal Vit-Fe Fumarate-FA (PRENATAL MULTIVITAMIN) TABS tablet Take 1 tablet by mouth daily at 12 noon.   03/29/2018 at Unknown time  . ACCU-CHEK SOFTCLIX LANCETS lancets Use as instructed 100 each 12 Taking  . Blood Glucose Monitoring Suppl (FREESTYLE LITE) DEVI 1 Device by Does not apply route 4 (four) times daily. 1 each 0 Taking  . DOXYLAMINE SUCCINATE PO Take 1 tablet by  mouth 2 (two) times daily as needed (allergies). otc medication   Taking  . glucose blood test strip Check CBG's percutaneously 6x's daily 100 each 12 Taking  . glyBURIDE (DIABETA) 1.25 MG tablet Take 1 tablet (1.25 mg total) by mouth 2 (two) times daily with a meal. 60 tablet 3 Taking  . glyBURIDE (DIABETA) 2.5 MG tablet Take 1 tablet (2.5 mg total) by mouth daily with breakfast. 30 tablet 3 Taking  . glyBURIDE (DIABETA) 2.5 MG tablet Take 1 tablet (2.5 mg total) by mouth daily with breakfast. 60 tablet 3 Taking  . glyBURIDE (DIABETA) 2.5 MG tablet Take 1 tablet (2.5 mg total) by mouth at bedtime. 30 tablet 5 Taking  . guaiFENesin-codeine 100-10 MG/5ML syrup Take 10 mLs by mouth 3 (three) times daily as needed for cough. (Patient not taking: Reported on 03/12/2018) 120 mL 0 Not Taking  . Lancets (FREESTYLE) lancets Use as instructed 100 each 12 Taking  . Lancets Misc. (ACCU-CHEK SOFTCLIX LANCET DEV) KIT Use 6 times daily as instructed 1 kit 1 Taking  . meclizine (ANTIVERT) 25 MG tablet Take 25 mg by mouth 2 (two) times daily as needed for nausea. otc medication   Not Taking  . pyridOXINE (VITAMIN B-6) 100 MG tablet Take 100 mg by mouth daily.   Unknown at Unknown time     Review of Systems  All systems reviewed and negative except as stated in HPI  Physical Exam Blood pressure 130/78, pulse 64, temperature 98.4 F (36.9 C), temperature source Oral, resp. rate 18, height 5' 8" (1.727 m), weight 152 lb (68.9 kg), last menstrual period 07/03/2017, SpO2 100 %. General appearance: alert, cooperative and no distress Lungs: clear to auscultation bilaterally Heart: regular rate and rhythm Abdomen: soft, non-tender; bowel sounds normal Extremities: No calf swelling or tenderness Presentation: cephalic by RN examination  Fetal monitoring: 125/ moderate/ +accels/ no decels  Uterine activity: 2-4/ moderate by palpation  Dilation: 6.5 Effacement (%): 90 Station: -2 Exam by:: Kristin  Weiss  Prenatal labs: ABO, Rh: O/Negative/-- (11/08 1652) Antibody: Positive, See Final Results (11/08 1652) Rubella: 2.23 (11/08 1652) RPR: Non Reactive (03/15 1045)  HBsAg: Negative (11/08 1652)  HIV: Non Reactive (03/15 1045)  GC/Chlamydia: Negative (4/25) GBS: Positive (04/25 1636)  2 hr Glucola: 78-184-66 Genetic screening:  Negative  Anatomy US: Normal female   Clinic     [facility] Prenatal Labs  Dating    LMP: 07/03/17 Blood   type: O/Negative/-- (11/08 1652)   Genetic Screen  AFP: Neg  NIPS:Mat21: neg Antibody:Positive, See Final Results (11/08 1652)  Anatomic [place] Normal female fetus; limited US f/u scheduled Rubella: 2.23 (11/08 1652)  GTT  Third trimester:  RPR: Non Reactive (03/15 1045)   Flu vaccine    09/18/17 HBsAg: Negative (11/08 1652)   TDaP vaccine      01-23-18                     Rhogam:01-23-18 HIV: Non Reactive (03/15 1045) NR  Baby Food     Breast                                    WUG:[id number] (04/25 1636)(For PCN allergy, check sensitivities)  Contraception     Ukn Pap: Repeat postpartum  Circumcision     Yes _0    Pediatrician     [facility] CF: + Gene  Support Person  Husband/FOB- Marny Lowenstein   Prenatal Classes no Hgb electrophoresis:WNL   Prenatal Transfer Tool  Maternal Diabetes: Yes:  Diabetes Type:  Insulin/Medication controlled Genetic Screening: Normal Maternal Ultrasounds/Referrals: Normal Fetal Ultrasounds or other Referrals:  None Maternal Substance Abuse:  No Significant Maternal Medications:  Meds include: Other: Glyburide  Significant Maternal Lab Results: Lab values include: Group B Strep positive  No results found for this or any previous visit (from the past 24 hour(s)).  Patient Active Problem List   Diagnosis Date Noted  . Gestational diabetes mellitus 03/30/2018  . H/O maternal fourth degree perineal laceration, currently pregnant 03/19/2018  . GBS (group B Streptococcus carrier), +RV culture, currently pregnant 03/09/2018  .  GDM (gestational diabetes mellitus) 01/26/2018  . History of gestational diabetes 01/23/2018  . 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 Anti-D antibodies present during pregnancy 10-12-2017  . Supervision of high risk pregnancy, antepartum 09/18/2017    Assessment: Avalyn Molino is a 32 y.o. G2P1001 at 55w4dhere for SOL.  #Labor: Expectant management. #Pain: Plans epidural  #FWB: Cat I #ID:  GBS pos #MOF: Breast #MOC:Unsure #Circ:  Yes, inpatient   RLajean Manes CNM 03/30/2018, 2:29 AM

## 2018-03-30 NOTE — Anesthesia Postprocedure Evaluation (Signed)
Anesthesia Post Note  Patient: Tracy Gallegos  Procedure(s) Performed: AN AD HOC LABOR EPIDURAL     Patient location during evaluation: Mother Baby Anesthesia Type: Epidural Level of consciousness: awake Pain management: satisfactory to patient Vital Signs Assessment: post-procedure vital signs reviewed and stable Respiratory status: spontaneous breathing Cardiovascular status: stable Anesthetic complications: no    Last Vitals:  Vitals:   03/30/18 0800 03/30/18 0900  BP: 111/82 107/62  Pulse: 60 68  Resp: 18 16  Temp: (!) 36.3 C 36.5 C  SpO2:      Last Pain:  Vitals:   03/30/18 1746  TempSrc:   PainSc: 3    Pain Goal: Patients Stated Pain Goal: 2 (03/30/18 1430)               Casimer Lanius

## 2018-03-30 NOTE — Lactation Note (Signed)
This note was copied from a baby's chart. Lactation Consultation Note  Patient Name: Tracy Gallegos Today's Date: 03/30/2018     [facility] Initial Visit:  Mother wanted to sleep and requested [place] return at a later time; will attempt to visit again.      Jonnatan Hanners R Jacai Kipp 03/30/2018, 11:11 PM

## 2018-03-30 NOTE — Anesthesia Preprocedure Evaluation (Addendum)
Anesthesia Evaluation  Patient identified by MRN, date of birth, ID band Patient awake    Reviewed: Allergy & Precautions, NPO status , Patient's Chart, lab work & pertinent test results  History of Anesthesia Complications Negative for: history of anesthetic complications  Airway Mallampati: I  TM Distance: >3 FB Neck ROM: Full    Dental  (+) Dental Advisory Given   Pulmonary neg pulmonary ROS,    breath sounds clear to auscultation       Cardiovascular (-) hypertension Rhythm:Regular Rate:Normal     Neuro/Psych Anxiety Depression negative neurological ROS     GI/Hepatic Neg liver ROS, GERD  Medicated and Poorly Controlled,  Endo/Other  diabetes, Gestational, Oral Hypoglycemic Agents  Renal/GU negative Renal ROS     Musculoskeletal   Abdominal   Peds  Hematology plt 175k   Anesthesia Other Findings   Reproductive/Obstetrics (+) Pregnancy                            Anesthesia Physical Anesthesia Plan  ASA: II  Anesthesia Plan: Epidural   Post-op Pain Management:    Induction:   PONV Risk Score and Plan: 2 and Treatment may vary due to age or medical condition  Airway Management Planned: Natural Airway  Additional Equipment:   Intra-op Plan:   Post-operative Plan:   Informed Consent: I have reviewed the patients History and Physical, chart, labs and discussed the procedure including the risks, benefits and alternatives for the proposed anesthesia with the patient or authorized representative who has indicated his/her understanding and acceptance.     Plan Discussed with:   Anesthesia Plan Comments: (Patient identified. Risks/Benefits/Options discussed with patient including but not limited to bleeding, infection, nerve damage, paralysis, failed block, incomplete pain control, headache, blood pressure changes, nausea, vomiting, reactions to medication both or allergic,  itching and postpartum back pain. Confirmed with bedside nurse the patient's most recent platelet count. Confirmed with patient that they are not currently taking any anticoagulation, have any bleeding history or any family history of bleeding disorders. Patient expressed understanding and wished to proceed. All questions were answered. )       Anesthesia Quick Evaluation

## 2018-03-31 ENCOUNTER — Encounter (HOSPITAL_COMMUNITY): Payer: Self-pay | Admitting: Obstetrics and Gynecology

## 2018-03-31 LAB — CBC
HEMATOCRIT: 31.7 % — AB (ref 36.0–46.0)
Hemoglobin: 11.1 g/dL — ABNORMAL LOW (ref 12.0–15.0)
MCH: 33 pg (ref 26.0–34.0)
MCHC: 35 g/dL (ref 30.0–36.0)
MCV: 94.3 fL (ref 78.0–100.0)
Platelets: 140 10*3/uL — ABNORMAL LOW (ref 150–400)
RBC: 3.36 MIL/uL — AB (ref 3.87–5.11)
RDW: 13.5 % (ref 11.5–15.5)
WBC: 13.1 10*3/uL — ABNORMAL HIGH (ref 4.0–10.5)

## 2018-03-31 MED ORDER — RHO D IMMUNE GLOBULIN 1500 UNIT/2ML IJ SOSY
300.0000 ug | PREFILLED_SYRINGE | Freq: Once | INTRAMUSCULAR | Status: AC
Start: 2018-03-31 — End: 2018-03-31
  Administered 2018-03-31: 300 ug via INTRAVENOUS
  Filled 2018-03-31: qty 2

## 2018-03-31 NOTE — Lactation Note (Signed)
This note was copied from a baby's chart. Lactation Consultation Note  Patient Name: Tracy Gallegos Today's Date: 03/31/2018    [facility] Initial Visit:    Mother has a "Do Not Disturb" sign on door; will attempt to return later.     Sabina Beavers R Alvena Kiernan 03/31/2018, 2:40 AM

## 2018-03-31 NOTE — Progress Notes (Signed)
Daily Post Partum Note  03/31/2018 Tracy Gallegos is a 32 y.o. [id number] PPD#1 s/p SVD/1st (repaired)  @ [redacted]w[redacted]d.  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    General: NAD Skin:  Warm and dry.  Respiratory:  Normal respiratory effort  Medications Current Facility-Administered Medications  Medication Dose Route Frequency Provider Last Rate Last Dose  . acetaminophen (TYLENOL) tablet 650 mg  650 mg Oral Q4H PRN [name], MD      . benzocaine-Menthol (DERMOPLAST) 20-0.5 % topical spray 1 application  1 application Topical PRN [name], MD      . coconut oil  1 application Topical PRN [name], MD      . witch hazel-glycerin (TUCKS) pad 1 application  1 application Topical PRN [name], MD       And  . dibucaine (NUPERCAINAL) 1 % rectal ointment 1 application  1 application Rectal PRN [name], MD      . diphenhydrAMINE (BENADRYL) capsule 25 mg  25 mg Oral Q6H PRN [name], MD      . FLUoxetine (PROZAC) capsule 20 mg  20 mg Oral Daily [name], MD   20 mg at 03/31/18 1237  . ibuprofen (ADVIL,MOTRIN) tablet 600 mg  600 mg Oral Q6H [name], MD   600 mg at 03/31/18 1204  . lidocaine (PF) (XYLOCAINE) 1 % injection 30 mL  30 mL Subcutaneous PRN Sloan Leiter, MD   30 mL at 03/30/18 0514  . methylergonovine (METHERGINE) tablet 0.2 mg  0.2 mg Oral Q4H PRN [name], MD       Or  .  methylergonovine (METHERGINE) injection 0.2 mg  0.2 mg Intramuscular Q4H PRN [name], MD      . ondansetron (ZOFRAN) tablet 4 mg  4 mg Oral Q4H PRN [name], MD       Or  . ondansetron (ZOFRAN) injection 4 mg  4 mg Intravenous Q4H PRN [name], MD      . oxyCODONE-acetaminophen (PERCOCET/ROXICET) 5-325 MG per tablet 1 tablet  1 tablet Oral Q4H PRN [name], MD   1 tablet at 03/31/18 1500  . oxyCODONE-acetaminophen (PERCOCET/ROXICET) 5-325 MG per tablet 2 tablet  2 tablet Oral Q4H PRN [name], MD      . prenatal multivitamin tablet 1 tablet  1 tablet Oral Q1200 [name], MD   1 tablet at 03/31/18 1204  . senna-docusate (Senokot-S) tablet 2 tablet  2 tablet Oral Q24H [name], MD   2 tablet at 03/30/18 2340  . simethicone (MYLICON) chewable tablet 80 mg  80 mg Oral PRN  [name], MD      . zolpidem [facility]) tablet 5 mg  5 mg Oral QHS PRN [name], MD        Labs:  Recent Labs  Lab 03/30/18 0232 03/31/18 0534  WBC 13.8* 13.1*  HGB 14.0 11.1*  HCT 39.6 31.7*  PLT 175 140*    Assessment & Plan:  Pt doing well *Postpartum/postop: routine care. *Psych: continue home prozac. Seen by SW, no issues *GDMa2: am fasting yesterday 101. Will get another one tomorrow morning.  *Dispo: likely tomorrow  O NEG / Rubella Immune / Varicella Immune/  RPR negative / HIV negative / HepBsAg negative / Tdap UTD: yes/ pap neg 09/2017 / Breast  / Contraception: barrier / Circ: I forgot to ask but RN to let me know if parents are interested/ Follow up: Altamese Cabal MD Attending Center for [facility])

## 2018-03-31 NOTE — Progress Notes (Signed)
MOB was referred for history of depression/anxiety. * Referral screened out by Clinical Social Worker because none of the following criteria appear to apply: ~ History of anxiety/depression during this pregnancy, or of post-partum depression. ~ Diagnosis of anxiety and/or depression within last 3 years OR * MOB's symptoms currently being treated with medication and/or therapy. Please contact the Clinical Social Worker if needs arise, by MOB request, or if MOB scores greater than 9/yes to question 10 on Edinburgh Postpartum Depression Screen.  MOB has Rx for Prozac. 

## 2018-03-31 NOTE — Progress Notes (Signed)
OB Note Patient sleeping. Will come back later  Durene Romans MD Attending Center for [facility] (Faculty Practice) 03/31/2018 Time: [phone]

## 2018-03-31 NOTE — Lactation Note (Signed)
This note was copied from a baby's chart. Lactation Consultation Note  Patient Name: Tracy Gallegos Today's Date: 03/31/2018 Reason for consult: Initial assessment;Early term 34-38.6wks  P2 mother whose infant is now 15 hours old.  Mother breastfed her first child for 10 months.  FOB changing diaper as I arrived.  Mother just recently finished feeding baby but he is acting hungry and showing cues so I suggested she put him back to breast.  Mother's breasts are soft and non tender and her nipples are reddened and irritated. She believes her latch was not adequate in a prior feed.  I offered to assist and mother accepted.  I suggested the football or cross cradle hold but mother prefers the cradle hold.  I informed her that sometimes the cradle hold is harder to maintain a deeper latch but mother still wanting this position.  Assisted to latch infant on the right breast.  After a couple of attempts he was able to latch with flanged lips and active sucking.  He breastfed for 5 minutes and fell asleep.  Encouraged continued STS, breast massage and hand expression after feedings.  Mother will feed infant 8-12 times/24 hours or earlier if he shows feeding cues.  Colostrum container and spoon provided.    I offered comfort gels with instructions for use.  Swaddled and placed baby in bassinet so parents can rest.  Mom made aware of O/P services, breastfeeding support groups, community resources, and our phone # for post-discharge questions. FOB present and supportive.  Mother will call as needed for assistance.  maternal Data Formula Feeding for Exclusion: No Has patient been taught Hand Expression?: Yes Does the patient have breastfeeding experience prior to this delivery?: Yes  Feeding Feeding Type: Breast Fed Length of feed: 5 min  LATCH Score Latch: Grasps breast easily, tongue down, lips flanged, rhythmical sucking.  Audible Swallowing: A few with stimulation  Type of Nipple: Everted at  rest and after stimulation  Comfort (Breast/Nipple): Soft / non-tender  Hold (Positioning): Assistance needed to correctly position infant at breast and maintain latch.  LATCH Score: 8  Interventions Interventions: Breast feeding basics reviewed;Assisted with latch;Skin to skin;Breast massage;Hand express;Position options;Support pillows;Adjust position;Comfort gels  Lactation Tools Discussed/Used Tools: Comfort gels   Consult Status Consult Status: Follow-up Date: 04/01/18 Follow-up type: In-patient    Tracy Gallegos 03/31/2018, 4:07 AM

## 2018-04-01 ENCOUNTER — Other Ambulatory Visit: Payer: Self-pay | Admitting: Advanced Practice Midwife

## 2018-04-01 LAB — RH IG WORKUP (INCLUDES ABO/RH)
ABO/RH(D): O NEG
Fetal Screen: NEGATIVE
Gestational Age(Wks): 38
Unit division: 0

## 2018-04-01 LAB — GLUCOSE, CAPILLARY: GLUCOSE-CAPILLARY: 83 mg/dL (ref 65–99)

## 2018-04-01 MED ORDER — AMOXICILLIN-POT CLAVULANATE 875-125 MG PO TABS: 1.0000 | ORAL_TABLET | Freq: Two times a day (BID) | ORAL | 0 refills | Status: DC

## 2018-04-01 MED ORDER — DIBUCAINE 1 % RE OINT: 1.0000 "application " | TOPICAL_OINTMENT | RECTAL | 1 refills | Status: DC | PRN

## 2018-04-01 MED ORDER — FLUOXETINE HCL 20 MG PO CAPS: 40.0000 mg | ORAL_CAPSULE | Freq: Every day | ORAL | 4 refills | Status: DC

## 2018-04-01 MED ORDER — IBUPROFEN 600 MG PO TABS: 600.0000 mg | ORAL_TABLET | Freq: Four times a day (QID) | ORAL | 0 refills | Status: DC | PRN

## 2018-04-01 MED FILL — AMOX-CLAV 875-125 MG TABLET: 875-125 | 7 days supply | Qty: 14 | Fill #0

## 2018-04-01 MED FILL — FLUoxetine HCL 20 MG CAPS: 20 | 15 days supply | Qty: 30 | Fill #0

## 2018-04-01 NOTE — Discharge Instructions (Signed)
Vaginal Delivery, Care After °Refer to this sheet in the next few weeks. These instructions provide you with information about caring for yourself after vaginal delivery. Your health care provider may also give you more specific instructions. Your treatment has been planned according to current medical practices, but problems sometimes occur. Call your health care provider if you have any problems or questions. °What can I expect after the procedure? °After vaginal delivery, it is common to have: °· Some bleeding from your vagina. °· Soreness in your abdomen, your vagina, and the area of skin between your vaginal opening and your anus (perineum). °· Pelvic cramps. °· Fatigue. ° °Follow these instructions at home: °Medicines °· Take over-the-counter and prescription medicines only as told by your health care provider. °· If you were prescribed an antibiotic medicine, take it as told by your health care provider. Do not stop taking the antibiotic until it is finished. °Driving ° °· Do not drive or operate heavy machinery while taking prescription pain medicine. °· Do not drive for 24 hours if you received a sedative. °Lifestyle °· Do not drink alcohol. This is especially important if you are breastfeeding or taking medicine to relieve pain. °· Do not use tobacco products, including cigarettes, chewing tobacco, or e-cigarettes. If you need help quitting, ask your health care provider. °Eating and drinking °· Drink at least 8 eight-ounce glasses of water every day unless you are told not to by your health care provider. If you choose to breastfeed your baby, you may need to drink more water than this. °· Eat high-fiber foods every day. These foods may help prevent or relieve constipation. High-fiber foods include: °? Whole grain cereals and breads. °? Brown rice. °? Beans. °? Fresh fruits and vegetables. °Activity °· Return to your normal activities as told by your health care provider. Ask your health care provider  what activities are safe for you. °· Rest as much as possible. Try to rest or take a nap when your baby is sleeping. °· Do not lift anything that is heavier than your baby or 10 lb (4.5 kg) until your health care provider says that it is safe. °· Talk with your health care provider about when you can engage in sexual activity. This may depend on your: °? Risk of infection. °? Rate of healing. °? Comfort and desire to engage in sexual activity. °Vaginal Care °· If you have an episiotomy or a vaginal tear, check the area every day for signs of infection. Check for: °? More redness, swelling, or pain. °? More fluid or blood. °? Warmth. °? Pus or a bad smell. °· Do not use tampons or douches until your health care provider says this is safe. °· Watch for any blood clots that may pass from your vagina. These may look like clumps of dark red, brown, or black discharge. °General instructions °· Keep your perineum clean and dry as told by your health care provider. °· Wear loose, comfortable clothing. °· Wipe from front to back when you use the toilet. °· Ask your health care provider if you can shower or take a bath. If you had an episiotomy or a perineal tear during labor and delivery, your health care provider may tell you not to take baths for a certain length of time. °· Wear a bra that supports your breasts and fits you well. °· If possible, have someone help you with household activities and help care for your baby for at least a few days after   you leave the hospital. °· Keep all follow-up visits for you and your baby as told by your health care provider. This is important. °Contact a health care provider if: °· You have: °? Vaginal discharge that has a bad smell. °? Difficulty urinating. °? Pain when urinating. °? A sudden increase or decrease in the frequency of your bowel movements. °? More redness, swelling, or pain around your episiotomy or vaginal tear. °? More fluid or blood coming from your episiotomy or  vaginal tear. °? Pus or a bad smell coming from your episiotomy or vaginal tear. °? A fever. °? A rash. °? Little or no interest in activities you used to enjoy. °? Questions about caring for yourself or your baby. °· Your episiotomy or vaginal tear feels warm to the touch. °· Your episiotomy or vaginal tear is separating or does not appear to be healing. °· Your breasts are painful, hard, or turn red. °· You feel unusually sad or worried. °· You feel nauseous or you vomit. °· You pass large blood clots from your vagina. If you pass a blood clot from your vagina, save it to show to your health care provider. Do not flush blood clots down the toilet without having your health care provider look at them. °· You urinate more than usual. °· You are dizzy or light-headed. °· You have not breastfed at all and you have not had a menstrual period for 12 weeks after delivery. °· You have stopped breastfeeding and you have not had a menstrual period for 12 weeks after you stopped breastfeeding. °Get help right away if: °· You have: °? Pain that does not go away or does not get better with medicine. °? Chest pain. °? Difficulty breathing. °? Blurred vision or spots in your vision. °? Thoughts about hurting yourself or your baby. °· You develop pain in your abdomen or in one of your legs. °· You develop a severe headache. °· You faint. °· You bleed from your vagina so much that you fill two sanitary pads in one hour. °This information is not intended to replace advice given to you by your health care provider. Make sure you discuss any questions you have with your health care provider. °Document Released: 10/25/2000 Document Revised: 04/10/2016 Document Reviewed: 11/12/2015 °Elsevier Interactive Patient Education © 2018 [employer] ° °

## 2018-04-01 NOTE — Discharge Summary (Addendum)
OB Discharge Summary     Patient Name: Tracy Gallegos DOB: [birth year] MRN: [id number] Date of admission: 03/30/2018  Delivering MD: [name] )  Date of discharge: 04/01/2018    Admitting diagnosis: 38.3 WEEKS CTX  Intrauterine pregnancy: [redacted]w[redacted]d   Secondary diagnosis:  Active Problems:   Patient Active Problem List   Diagnosis Date Noted  . Gestational diabetes mellitus 03/30/2018  . SVD (spontaneous vaginal delivery) 03/30/2018  . H/O maternal fourth degree perineal laceration, currently pregnant 03/19/2018  . GBS (group B Streptococcus carrier), +RV culture, currently pregnant 03/09/2018  . GDM (gestational diabetes mellitus) 01/26/2018  . 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 Supervision of high risk pregnancy, antepartum 09/18/2017    Additional problems: None     Discharge diagnosis: Term Pregnancy Delivered                                                                                                Post partum procedures:rhogam  Complications: None  Hospital course:  Onset of Labor With Vaginal Delivery     32y.o. yo [id number] 310w4das admitted in Active Labor on 03/30/2018. Patient had an uncomplicated labor course as follows:  Membrane Rupture Time/Date: 3:15 AM ,03/30/2018   Intrapartum Procedures: Episiotomy: None [1]                                         Lacerations:  1st degree [2];Vaginal [6]  Patient had a delivery of a Viable infant. 03/30/2018  Information for the patient's newborn:  WeMirella, Gueye0[[id number]]Delivery Method: Vaginal, Spontaneous(Filed from Delivery Summary)    Pateint had an uncomplicated postpartum course. She had fasting CGBs of 101 and 83.  She is ambulating, tolerating a regular diet, passing flatus, and urinating well. Patient is discharged home in stable condition on 04/01/18.   Physical exam  Vitals:   03/31/18 1920 04/01/18 0536  BP: 101/63 116/73  Pulse: 66 66  Resp: 18 18  Temp: 98.3 F (36.8 C) 98.3 F (36.8 C)  SpO2:  100%    General: alert, cooperative and no distress Lochia: appropriate Uterine Fundus: firm Incision: N/A DVT Evaluation: No evidence of DVT seen on physical exam.  Labs: Results for orders placed or performed during the hospital encounter of 03/30/18 (from the past 24 hour(s))  Glucose, capillary     Status: None   Collection Time: 04/01/18  5:36 AM  Result Value Ref Range   Glucose-Capillary 83 65 - 99 mg/dL     Discharge instruction: per After Visit Summary and "Baby and Me Booklet".  After visit meds:  Allergies as of 04/01/2018      Reactions   Nitrous Oxide Nausea And Vomiting   Patient would like to try during labor. States it has been 20 years since she experienced this.      Medication List    STOP taking these medications  ACCU-CHEK SOFTCLIX LANCET DEV Kit   ACCU-CHEK SOFTCLIX LANCETS lancets   freestyle lancets   FREESTYLE LITE Devi   glucose blood test strip   glyBURIDE 2.5 MG tablet Commonly known as:  DIABETA     TAKE these medications   amoxicillin-clavulanate 875-125 MG tablet Commonly known as:  AUGMENTIN Take 1 tablet by mouth every 12 (twelve) hours.   dibucaine 1 % Oint Commonly known as:  NUPERCAINAL Place 1 application rectally as needed for hemorrhoids.   FLUoxetine 20 MG capsule Commonly known as:  PROZAC Take 2 capsules (40 mg total) by mouth daily. What changed:  how much to take   ibuprofen 600 MG tablet Commonly known as:  ADVIL,MOTRIN Take 1 tablet (600 mg total) by mouth every 6 (six) hours as needed.   omeprazole 20 MG capsule Commonly known as:  PRILOSEC Take 20 mg by mouth daily.   prenatal multivitamin Tabs tablet Take 1 tablet by mouth daily at 12 noon.       Diet: routine diet  Activity: Advance as tolerated. Pelvic rest for 6 weeks.   Outpatient follow up:6 weeks Future  Appointments:  Future Appointments  Date Time Provider [place]  04/27/2018 10:30 AM Constant, Vickii Chafe, MD [place] None  06/30/2018 11:45 AM Lucille Passy, MD [id number]    Follow up Appt: No follow-ups on file.   Postpartum contraception: Undecided  Newborn Data: APGAR (1 MIN): 8   APGAR (5 MINS): 9    Baby Feeding: Breast Disposition:home with mother  Mathis Bud, Medical Student  04/01/2018  CNM attestation I have seen and examined this patient and agree with above documentation in the med student's note.   Tracy Gallegos is a 32 y.o. [id number] s/p SVD.   Pain is well controlled.  Plan for birth control is unsure.  Method of Feeding: breast  PE:  BP 116/73 (BP Location: Left Arm)   Pulse 66   Temp 98.3 F (36.8 C)   Resp 18   Ht '5\' 8"'  (1.727 m)   Wt 68.9 kg (152 lb)   LMP 07/03/2017 Comment: breast  feeding  SpO2 100%   Breastfeeding? Unknown   BMI 23.11 kg/m  Fundus firm  Recent Labs    03/30/18 0232 03/31/18 0534  HGB 14.0 11.1*  HCT 39.6 31.7*     Plan: discharge today - postpartum care discussed - f/u clinic in 6 weeks for postpartum visit and GTT  Addendum: pt with increased lower uterine tenderness; afebrile; also with concerns re laceration status; lacs well approximated and healing; will give rx for Augmentin to use prn increasing uterine pain or if becomes febrile (ie endometritis); also increased Prozac to 9m (pt's prepreg dose) with refills until her annual exam; rx dibucaine for perineal discomfort per pt request   SSerita Grammes CNM 2:08 PM 04/01/2018

## 2018-04-01 NOTE — Lactation Note (Signed)
This note was copied from a baby's chart. Lactation Consultation Note  Patient Name: Tracy Gallegos Date: 04/01/2018 Reason for consult: Follow-up assessment;Early term 37-38.6wks;Infant weight loss   Follow up with mom of 84 hour old infant. Infant with 9 BF for 10-24 minutes, BF attempts x 2, 2 voids and 3 stools in the last 24 hours. Infant weight 5 pounds 14.9 ounces with 0.2 oz weight gain in the last 24 hours. LATCH scores 8-9. Parents report infant spit up large amount of colostrum after last feeding.   Mom reports she is feeling fuller. She is experiencing nipple pain with initial latch that improves with feeding. Mom is using comfort gels between feeding. Second set of comfort gels given at request of mom and dad. Parents report infant stools are no longer meconium. Mom reports she had inverted nipples with last child however they are everted now. Mom reports her nipples are rounded post BF.   Enc mom to offer both breast with each feeding at initial feeding cues. Mom has only been offering one breast with feeding, enc her to offer second if infant is still showing feeding cues.   Mom latched infant the the right breast in the cradle hold. She reports she knows it is not the best hold in the NB period but it works for her. Mom is able to get infant latched deeply and maintains latch throughout feeding. Infant was actively feeding throughout feeding, mom stimulated infant and massaged breast with feeding as infant slowed suckling.   Reviewed I/O, signs of dehydration in the infant, Engorgement prevention/treatment, and breast milk expression and treatment.   [facility] Brochure reviewed, mom aware of OP services, BF Support Groups and [place] phone #.  Mom concerned with infant weight and wanting to know how to get weight checks. Infant has follow up Ped appt scheduled. Discussed using BF Support groups for weight checks and [employer], parents have handout/brochures on  both.  Parents reports all questions/concerns have been answered. Mom aware she can call for feeding assessment post D/C if needed. Parents to call out for feeding assistance while still here as needed.         Maternal Data Formula Feeding for Exclusion: No Has patient been taught Hand Expression?: Yes Does the patient have breastfeeding experience prior to this delivery?: Yes  Feeding Feeding Type: Breast Fed Length of feed: 15 min(still Bf when LC left room)  LATCH Score Latch: Grasps breast easily, tongue down, lips flanged, rhythmical sucking.  Audible Swallowing: Spontaneous and intermittent  Type of Nipple: Everted at rest and after stimulation  Comfort (Breast/Nipple): Filling, red/small blisters or bruises, mild/mod discomfort  Hold (Positioning): No assistance needed to correctly position infant at breast.  LATCH Score: 9  Interventions Interventions: Breast feeding basics reviewed;Support pillows;Skin to skin;Hand express  Lactation Tools Discussed/Used Tools: Comfort gels WIC Program: No Pump Review: Milk Storage   Consult Status Consult Status: Complete Follow-up type: Call as needed    Donn Pierini 04/01/2018, 10:09 AM

## 2018-04-01 NOTE — Plan of Care (Signed)
Patient progressing well. Pain management focused today and non-pharmacological measures given. Adequate for discharge. Jackson Latino, RN

## 2018-04-02 ENCOUNTER — Other Ambulatory Visit: Payer: Self-pay | Admitting: *Deleted

## 2018-04-02 ENCOUNTER — Other Ambulatory Visit: Payer: 59

## 2018-04-02 ENCOUNTER — Encounter: Payer: 59 | Admitting: Obstetrics and Gynecology

## 2018-04-02 LAB — TYPE AND SCREEN
ABO/RH(D): O NEG
Antibody Screen: POSITIVE
UNIT DIVISION: 0
Unit division: 0

## 2018-04-02 LAB — BPAM RBC
BLOOD PRODUCT EXPIRATION DATE: 201906042359
UNIT TYPE AND RH: 9500

## 2018-04-02 NOTE — Patient Outreach (Signed)
[place] [facility]) Care Management  04/02/2018  Tracy Gallegos 11/23/1985 [id number]   Subjective: Telephone call to patient's home  / mobile number, no answer, left HIPAA compliant voicemail message, and requested call back.     Objective: Per KPN (Knowledge Performance Now, point of care tool) and chart review, patient hospitalized 03/30/18 -04/01/18 for Intrauterine pregnancy: 38weeks4day, status post delivery baby boy, SVD (spontaneous vaginal delivery).   Patient also has a history of  Gestational diabetes mellitus, GBS (group B Streptococcus carrier), +RV culture,, and Cystic fibrosis carrier.       Assessment:   Received UMR Transition of care referral on 04/01/18.   Transition of care follow up pending patient contact.      Plan:RNCM will send unsuccessful outreach  letter, [facility] pamphlet, will call patient for 2nd telephone outreach attempt, transition of care follow up, and proceed with case closure, within 10 business days if no return call.        Quinlan Vollmer H. Annia Friendly, BSN, [place] Management [facility] Telephonic CM Phone: [phone] Fax: [phone]

## 2018-04-03 ENCOUNTER — Inpatient Hospital Stay (HOSPITAL_COMMUNITY): Admission: RE | Admit: 2018-04-03 | Payer: 59 | Source: Ambulatory Visit

## 2018-04-07 ENCOUNTER — Other Ambulatory Visit: Payer: Self-pay | Admitting: *Deleted

## 2018-04-07 NOTE — Patient Outreach (Signed)
[place] [facility]) Care Management  04/07/2018  Tracy Gallegos May 24, 1986 [id number]   Subjective: Telephone call to patient's home  / mobile number, no answer, voicemail full, and unable to leave a message.      Objective: Per KPN (Knowledge Performance Now, point of care tool) and chart review, patient hospitalized 03/30/18 -04/01/18 for Intrauterine pregnancy:38weeks4day, status post delivery baby boy, SVD (spontaneous vaginal delivery).   Patient also has a history of Gestational diabetes mellitus, GBS (group B Streptococcus carrier), +RV culture,, and Cystic fibrosis carrier.       Assessment:   Received UMR Transition of care referral on 04/01/18.   Transition of care follow up pending patient contact.      Plan:RNCM has sent unsuccessful outreach  letter, [facility] pamphlet, will call patient for 3rd telephone outreach attempt, transition of care follow up, and proceed with case closure, within 10 business days if no return call.       Niclas Markell H. Annia Friendly, BSN, [place] Management [facility] Telephonic CM Phone: [phone] Fax: [phone]

## 2018-04-08 ENCOUNTER — Other Ambulatory Visit: Payer: Self-pay | Admitting: *Deleted

## 2018-04-08 NOTE — Patient Outreach (Signed)
[place] [facility]) Care Management  04/08/2018  Loreal Schuessler 06-03-1986 [id number]   [place] call to patient's home / mobile number, no answer, voicemail full, and unable to leave a message.      Objective:Per KPN (Knowledge Performance Now, point of care tool) and chart review,patient hospitalized 03/30/18 -04/01/18 forIntrauterine pregnancy:38weeks4day, status post delivery baby boy,SVD (spontaneous vaginal delivery). 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       Assessment: Received UMR Transition of care referral on 04/01/18.Transition of care follow up pending patient contact.     Plan:RNCM has sent unsuccessful outreach letter, [facility] pamphlet, and will proceed with case closure, within 10 business days if no return call.       Arlander Gillen H. Annia Friendly, BSN, [place] Management [facility] Telephonic CM Phone: [phone] Fax: [phone]

## 2018-04-10 ENCOUNTER — Other Ambulatory Visit: Payer: Self-pay | Admitting: *Deleted

## 2018-04-10 NOTE — Patient Outreach (Signed)
[facility] [facility]) Care Management  04/10/2018  Tracy Gallegos 10/24/1986 [id number]   Subjective: Received voicemail message from  [facility], states she is returning call for Tracy Gallegos, her married name is Tracy Gallegos, and requested call back.   Telephone call to patient's home  / mobile number, no answer, left HIPAA compliant voicemail message, and requested call back.   Objective:Per KPN (Knowledge Performance Now, point of care tool) and chart review,patient hospitalized 03/30/18 -04/01/18 forIntrauterine pregnancy:38weeks4day, status post delivery baby boy,SVD (spontaneous vaginal delivery). 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       Assessment: Received UMR Transition of care referral on 04/01/18.Transition of care follow up pending patient contact.     Plan:RNCMhas sentunsuccessful outreach letter, [facility] pamphlet, and will proceed with case closure, within 10 business days if no return call.       Kadir Azucena H. Annia Friendly, BSN, [place] Management [facility] Telephonic CM Phone: [phone] Fax: [phone]

## 2018-04-15 ENCOUNTER — Other Ambulatory Visit: Payer: Self-pay | Admitting: *Deleted

## 2018-04-15 NOTE — Patient Outreach (Signed)
[place] [facility]) Care Management  04/15/2018  Monique Hefty [birth year] [id number]   No response from patient outreach attempts will proceed with case closure.    Objective:Per KPN (Knowledge Performance Now, point of care tool) and chart review,patient hospitalized 03/30/18 -04/01/18 forIntrauterine pregnancy:38weeks4day, status post delivery baby boy,SVD (spontaneous vaginal delivery). 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       Assessment: Received UMR Transition of care referral on 04/01/18.Transition of care follow up not completed due to unable to contact patient and will proceed with case closure.      Plan:Case closure due to unable to reach.       Laporscha Linehan H. Annia Friendly, BSN, [place] Management [facility] Telephonic CM Phone: [phone] Fax: [phone]

## 2018-04-22 MED FILL — FLUoxetine HCL 20 MG CAPS: 20 | 15 days supply | Qty: 30 | Fill #1

## 2018-04-27 ENCOUNTER — Ambulatory Visit: Payer: 59 | Admitting: Obstetrics and Gynecology

## 2018-05-04 ENCOUNTER — Ambulatory Visit (INDEPENDENT_AMBULATORY_CARE_PROVIDER_SITE_OTHER): Payer: 59 | Admitting: Obstetrics and Gynecology

## 2018-05-04 ENCOUNTER — Encounter: Payer: Self-pay | Admitting: Obstetrics and Gynecology

## 2018-05-04 DIAGNOSIS — F32A Depression, unspecified: Secondary | ICD-10-CM

## 2018-05-04 DIAGNOSIS — O99342 Other mental disorders complicating pregnancy, second trimester: Secondary | ICD-10-CM

## 2018-05-04 DIAGNOSIS — Z1389 Encounter for screening for other disorder: Secondary | ICD-10-CM

## 2018-05-04 DIAGNOSIS — F329 Major depressive disorder, single episode, unspecified: Secondary | ICD-10-CM

## 2018-05-04 MED ORDER — FLUOXETINE HCL 20 MG PO CAPS: 40.0000 mg | ORAL_CAPSULE | Freq: Every day | ORAL | 4 refills | Status: DC

## 2018-05-04 NOTE — Progress Notes (Signed)
Obstetrics/Postpartum Visit  Appointment Date: 05/04/2018  OBGYN Clinic: [facility]  Primary Care Provider: Patient, No Pcp Per  Chief Complaint:  Chief Complaint  Patient presents with  . Postpartum Care    History of Present Illness: Tracy Gallegos is a 32 y.o. Caucasian G2P2002 (Patient's last menstrual period was 07/03/2017.), seen for the above chief complaint. Her past medical history is significant for 4th degree laceration in 1st pregnancy.   She is s/p  on SVD at [redacted]w[redacted]d; she was discharged to home on PPD#2. Pregnancy complicated by gDMA2, RH negative, anti-D antibodies, h/o 4th degree laceration.  Complains of fatigue. Feels like she is doing well on Prozac, coping well. She is very stressed as she does not have much help at home but states she is doing okay and she will be okay. Denies suicidal, homicidal ideation. Has had some large clots passing, irregular bleeding but that has lightened now.   Vaginal bleeding or discharge: Yes  Breast or formula feeding: breast Intercourse: No  Contraception:  PP depression s/s: Yes  Any bowel or bladder issues: No  Pap smear: no abnormalities (date: 09/2017)  Review of Systems: Positive for the above.   Her 12 point review of systems is negative or as noted in the History of Present Illness.  Patient Active Problem List   Diagnosis Date Noted  . Gestational diabetes mellitus 03/30/2018  . SVD (spontaneous vaginal delivery) 03/30/2018  . H/O maternal fourth degree perineal laceration, currently pregnant 03/19/2018  . GBS (group B Streptococcus carrier), +RV culture, currently pregnant 03/09/2018  . GDM (gestational diabetes mellitus) 01/26/2018  . 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 Anti-D antibodies present during pregnancy 09-26-2017  . Supervision of high risk pregnancy, antepartum 09/18/2017    Medications [name] had no medications administered during this  visit. Current Outpatient Medications  Medication Sig Dispense Refill  . FLUoxetine (PROZAC) 20 MG capsule Take 2 capsules (40 mg total) by mouth daily. 60 capsule 4  . Prenatal Vit-Fe Fumarate-FA (PRENATAL MULTIVITAMIN) TABS tablet Take 1 tablet by mouth daily at 12 noon.    Marland Kitchen amoxicillin-clavulanate (AUGMENTIN) 875-125 MG tablet Take 1 tablet by mouth every 12 (twelve) hours. (Patient not taking: Reported on 05/04/2018) 14 tablet 0  . dibucaine (NUPERCAINAL) 1 % OINT Place 1 application rectally as needed for hemorrhoids. (Patient not taking: Reported on 05/04/2018) 1 Tube 1  . ibuprofen (ADVIL,MOTRIN) 600 MG tablet Take 1 tablet (600 mg total) by mouth every 6 (six) hours as needed. (Patient not taking: Reported on 05/04/2018) 30 tablet 0  . omeprazole (PRILOSEC) 20 MG capsule Take 20 mg by mouth daily.     No current facility-administered medications for this visit.     Allergies Nitrous oxide  Physical Exam:  BP 133/90   Pulse 96   Wt 139 lb 8 oz (63.3 kg)   LMP 07/03/2017 Comment: breast  feeding  Breastfeeding? Yes   BMI 21.21 kg/m  Body mass index is 21.21 kg/m. General appearance: Well nourished, well developed female in no acute distress.  Cardiovascular: regular rate and rhythm Respiratory:  Clear to auscultation bilateral. Normal respiratory effort Abdomen: positive bowel sounds and no masses, hernias; diffusely non tender to palpation, non distended Breasts: not examined. Neuro/Psych:  Normal mood and affect.  Skin:  Warm and dry.   Pelvic exam: is not limited by body habitus EGBUS: within normal limits GU: perinum intact, sutures not present/visible/palpable, no evidence of infection, erythema,  break down, significant tenderness in pelvic floor muscles  PP Depression Screening:  13  Assessment: Patient is a 32 y.o. [id number] who is 4 weeks post partum from a SVD. She is doing well, feels she is coping okay but requesting refill on prozac. Declines to see behavioral  health today. Declines contraception. She will call with any issues.  Plan:   1. Status post delivery at term Benign exam, perineal laceration healing well Tenderness on pelvic floor, may need PT if no improvement  2. Depression affecting pregnancy in second trimester, antepartum - FLUoxetine (PROZAC) 20 MG capsule; Take 2 capsules (40 mg total) by mouth daily.  Dispense: 60 capsule; Refill: 4    RTC for annual 08/2017   K. Arvilla Meres, M.D. Attending [place], [facility] for [employer], [place]

## 2018-05-06 MED FILL — FLUoxetine HCL 20 MG CAPS: 20 | 15 days supply | Qty: 30 | Fill #2

## 2018-05-08 ENCOUNTER — Ambulatory Visit (INDEPENDENT_AMBULATORY_CARE_PROVIDER_SITE_OTHER): Payer: 59 | Admitting: Family Medicine

## 2018-05-08 ENCOUNTER — Encounter: Payer: Self-pay | Admitting: Family Medicine

## 2018-05-08 VITALS — BP 108/68 | HR 79 | Temp 98.4°F | Ht 68.0 in | Wt 140.0 lb

## 2018-05-08 DIAGNOSIS — Z0000 Encounter for general adult medical examination without abnormal findings: Secondary | ICD-10-CM

## 2018-05-08 NOTE — Progress Notes (Signed)
   Subjective:  Tracy Gallegos is a 32 y.o. year old female who presents to office today for an annual physical examination. No concerns today.   Review of Systems  Constitutional: Negative for fever and weight loss.  HENT: Negative for ear pain, hearing loss and sinus pain.   Eyes: Negative for blurred vision.  Respiratory: Negative for cough, shortness of breath and wheezing.   Cardiovascular: Negative for chest pain and leg swelling.  Gastrointestinal: Negative for abdominal pain, blood in stool, constipation, diarrhea, heartburn, melena, nausea and vomiting.  Genitourinary: Negative for dysuria and frequency.  Musculoskeletal: Negative for back pain and joint pain.  Skin: Negative for rash.  Neurological: Negative for dizziness, tingling, focal weakness and headaches.  Psychiatric/Behavioral: Negative for depression and suicidal ideas.   General Healthcare: Medication Compliance: compliant with all medications  Dx Hypertension: no Dx Hyperlipidemia: no Diabetes: no Dx Obesity: no Weight Loss: no Physical Activity: exercises 3 times a week  Urinary Incontinence: no Menstrual hx: LMP was August 2018 (is breastfeeding). Delivered 03/30/18  Last dental exam: About 1.5 years ago   Social:  reports that she has never smoked. She has never used smokeless tobacco. Driving: Drives herself, wears a seatbelt  Alcohol Use: no Tobacco no  Other Drugs: no  Support and Life at Home: no Advanced Directives: no Work: homemaker   Past Medical History Past Medical History:  Diagnosis Date  . Depression   . Endometriosis   . History of gestational diabetes 01/23/2018  . Ovarian cyst    Bilateral   Patient Active Problem List   Diagnosis Date Noted  . Gestational diabetes mellitus 03/30/2018  . SVD (spontaneous vaginal delivery) 03/30/2018  . H/O maternal fourth degree perineal laceration, currently pregnant 03/19/2018  . GBS (group B Streptococcus carrier), +RV culture,  currently pregnant 03/09/2018  . GDM (gestational diabetes mellitus) 01/26/2018  . 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 Anti-D antibodies present during pregnancy 10/19/17  . Supervision of high risk pregnancy, antepartum 09/18/2017    Medications- reviewed and updated Current Outpatient Medications  Medication Sig Dispense Refill  . FLUoxetine (PROZAC) 20 MG capsule Take 2 capsules (40 mg total) by mouth daily. 60 capsule 4  . Prenatal Vit-Fe Fumarate-FA (PRENATAL MULTIVITAMIN) TABS tablet Take 1 tablet by mouth daily at 12 noon.     No current facility-administered medications for this visit.     Objective: Pulse 79   Temp 98.4 F (36.9 C)   LMP 07/03/2017 Comment: breast  feeding  SpO2 99%  Gen: In no acute distress, alert, cooperative with exam, well groomed HEENT: NCAT, EOMI, PERRL CV: Regular rate and rhythm, normal S1/S2, no murmur Resp: Clear to auscultation bilaterally, no wheezes, non-labored Abd: Soft, Non Tender, Non Distended, bowel sounds present, no guarding or organomegaly Ext: No edema, warm and well perfused Neuro: Alert and oriented, No gross deficits, normal gait Psych: Normal mood and affect   Assessment/Plan:  Annual physical exam: Patient doing well overall. No concerns today. She is up to date on her health maintenance items.  -Follow up in 1 year for next physical   Smitty Cords, MD [place], PGY-3

## 2018-05-11 ENCOUNTER — Other Ambulatory Visit: Payer: 59

## 2018-05-11 ENCOUNTER — Ambulatory Visit: Payer: 59 | Admitting: Obstetrics and Gynecology

## 2018-05-22 MED FILL — FLUoxetine HCL 20 MG CAPS: 20 | 30 days supply | Qty: 60 | Fill #0

## 2018-06-09 ENCOUNTER — Encounter

## 2018-06-29 MED FILL — FLUoxetine HCL 20 MG CAPS: 20 | 30 days supply | Qty: 60 | Fill #1

## 2018-06-30 ENCOUNTER — Ambulatory Visit: Payer: 59 | Admitting: Family Medicine

## 2018-06-30 ENCOUNTER — Encounter: Payer: Self-pay | Admitting: Family Medicine

## 2018-06-30 VITALS — BP 98/62 | HR 96 | Temp 99.3°F | Ht 68.0 in | Wt 136.0 lb

## 2018-06-30 DIAGNOSIS — Z8632 Personal history of gestational diabetes: Secondary | ICD-10-CM

## 2018-06-30 DIAGNOSIS — F418 Other specified anxiety disorders: Secondary | ICD-10-CM

## 2018-06-30 DIAGNOSIS — G479 Sleep disorder, unspecified: Secondary | ICD-10-CM

## 2018-06-30 NOTE — Assessment & Plan Note (Signed)
Well controlled on current dose of fluoxetine- see GAD7 and PHQ 9 screenings. No changes made.

## 2018-06-30 NOTE — Assessment & Plan Note (Signed)
Advised to check FSBS at home a few times and to keep me updated. The patient indicates understanding of these issues and agrees with the plan.

## 2018-06-30 NOTE — Assessment & Plan Note (Signed)
Currently not taking anything but will take adderall again once she has finished breast feeding.

## 2018-06-30 NOTE — Progress Notes (Signed)
Subjective:   Patient ID: Tracy Gallegos, female    DOB: 04-26-1986, 32 y.o.   MRN: [id number]  Tracy Gallegos is a pleasant 32 y.o. year old female who presents to clinic today with [place] (Pt would like to establish care, needs to continue Fluoxetine. Pt had last CPE 05/08/2018, LMP 06/2017 and breastfeeding had baby 03/2018, had gestational diabetes)  on 06/30/2018  HPI:  Last had CPX on 05/08/18.  Had second baby in 03/2018, he is now breast feeding well.  Also has a 42 year old daughter, Tracy Gallegos. Husband is a second year resident at [facility].  H/o gestational diabetes.  Has not been checking FSBS post partum but she will.  Depression/anxiety- has been on prozac since she was 32 years old.  Decreased down to 20 mg daily during pregnancy and just increased back go 40 mg daily after delivery and doing well.  Sleep disorder- multiple sleep studies done which showed that she does not go into deep REM sleep.  Was followed by different sleep doctors and placed on rxs, including seroquel and nuvigil,(both made her feel awful).  Now takes low dose adderall (not currently because she is breast feeding), which seems to be working well.  Current Outpatient Medications on File Prior to Visit  Medication Sig Dispense Refill  . amphetamine-dextroamphetamine (ADDERALL) 10 MG tablet dextroamphetamine-amphetamine 10 mg tablet    . amphetamine-dextroamphetamine (ADDERALL) 5 MG tablet dextroamphetamine-amphetamine 5 mg tablet    . FLUoxetine (PROZAC) 20 MG capsule Take 2 capsules (40 mg total) by mouth daily. 60 capsule 4  . Prenatal Vit-Fe Fumarate-FA (PRENATAL MULTIVITAMIN) TABS tablet Take 1 tablet by mouth daily at 12 noon.    . Prenatal w/o A Vit-Fe Fum-FA (VINATE CARE) 40-1 MG CHEW 1 tab daily     No current facility-administered medications on file prior to visit.     Allergies  Allergen Reactions  . Nitrous Oxide Nausea And Vomiting    Patient would like to try during labor.  States it has been 20 years since she experienced this.    Past Medical History:  Diagnosis Date  . Depression   . Endometriosis   . History of gestational diabetes 01/23/2018  . Ovarian cyst    Bilateral    Past Surgical History:  Procedure Laterality Date  . OVARIAN CYST SURGERY    . WISDOM TOOTH EXTRACTION      Family History  Problem Relation Age of Onset  . Hypertension Mother   . Obesity Mother   . Depression Mother   . Alcohol abuse Mother   . Heart disease Father   . Early death Father   . Obesity Brother   . Alcohol abuse Maternal Uncle   . Depression Maternal Uncle   . Hypertension Maternal Grandmother   . Depression Maternal Grandmother   . Alcohol abuse Maternal Grandmother   . Alzheimer's disease Maternal Grandmother   . Depression Maternal Grandfather   . Stroke Maternal Grandfather   . Heart disease Maternal Grandfather   . Suicidality Maternal Grandfather   . Suicidality Paternal Grandmother   . Depression Paternal Grandmother   . Hearing loss Paternal Grandfather     Social History   Socioeconomic History  . Marital status: Married    Spouse name: Not on file  . Number of children: Not on file  . Years of education: Not on file  . Highest education level: Not on file  Occupational History  . Not on file  Social Needs  .  Financial resource strain: Not on file  . Food insecurity:    Worry: Not on file    Inability: Not on file  . Transportation needs:    Medical: Not on file    Non-medical: Not on file  Tobacco Use  . Smoking status: Never Smoker  . Smokeless tobacco: Never Used  Substance and Sexual Activity  . Alcohol use: No  . Drug use: No  . Sexual activity: Yes    Birth control/protection: None  Lifestyle  . Physical activity:    Days per week: Not on file    Minutes per session: Not on file  . Stress: Not on file  Relationships  . Social connections:    Talks on phone: Not on file    Gets together: Not on file     Attends religious service: Not on file    Active member of club or organization: Not on file    Attends meetings of clubs or organizations: Not on file    Relationship status: Not on file  . Intimate partner violence:    Fear of current or ex partner: Not on file    Emotionally abused: Not on file    Physically abused: Not on file    Forced sexual activity: Not on file  Other Topics Concern  . Not on file  Social History Narrative  . Not on file   The PMH, PSH, Social History, Family History, Medications, and allergies have been reviewed in [facility], and have been updated if relevant.   Review of Systems  Constitutional: Negative.   HENT: Negative.   Eyes: Negative.   Respiratory: Negative.   Cardiovascular: Negative.   Gastrointestinal: Negative.   Endocrine: Negative.   Genitourinary: Negative.   Musculoskeletal: Negative.   Neurological: Negative.   Psychiatric/Behavioral: Negative.   All other systems reviewed and are negative.      Objective:    BP 98/62 (BP Location: Left Arm, Patient Position: Sitting, Cuff Size: Small)   Pulse 96   Temp 99.3 F (37.4 C) (Oral)   Ht 5\' 8"  (1.727 m)   Wt 136 lb (61.7 kg)   LMP 06/18/2017   SpO2 97%   BMI 20.68 kg/m    Physical Exam  Constitutional: She is oriented to person, place, and time. She appears well-developed and well-nourished. No distress.  HENT:  Head: Normocephalic and atraumatic.  Eyes: EOM are normal.  Neck: Normal range of motion.  Cardiovascular: Normal rate and regular rhythm.  Pulmonary/Chest: Effort normal and breath sounds normal.  Musculoskeletal: Normal range of motion. She exhibits no edema.  Neurological: She is alert and oriented to person, place, and time. No cranial nerve deficit.  Skin: Skin is warm and dry. She is not diaphoretic.  Psychiatric: She has a normal mood and affect. Her behavior is normal. Judgment and thought content normal.  Nursing note and vitals reviewed.           Assessment & Plan:   Depression with anxiety  History of gestational diabetes No follow-ups on file.

## 2018-06-30 NOTE — Patient Instructions (Signed)
Great to meet you. Your family is beautiful!!  Please keep me updated.

## 2018-07-01 ENCOUNTER — Ambulatory Visit (INDEPENDENT_AMBULATORY_CARE_PROVIDER_SITE_OTHER): Payer: Self-pay | Admitting: Family Medicine

## 2018-07-01 VITALS — BP 100/65 | HR 100 | Temp 98.6°F | Resp 18 | Wt 134.4 lb

## 2018-07-01 DIAGNOSIS — R6889 Other general symptoms and signs: Secondary | ICD-10-CM

## 2018-07-01 MED ORDER — AZELASTINE HCL 0.1 % NA SOLN: 1.0000 | Freq: Two times a day (BID) | NASAL | 0 refills | Status: DC

## 2018-07-01 MED ORDER — IBUPROFEN 800 MG PO TABS: 800.0000 mg | ORAL_TABLET | Freq: Three times a day (TID) | ORAL | 0 refills | Status: DC | PRN

## 2018-07-01 NOTE — Patient Instructions (Addendum)
UTD information provided on breastfeeding and prescribed medication azelastine.  It is not known if azelastine (nasal) is present in breast milk. The manufacturer recommends that caution be exercised when administering azelastine (nasal) to breastfeeding women; the decision to breastfeed during therapy should consider the risk of infant exposure, benefits of breastfeeding to the infant, and benefits of treatment to the mother. Mechanism of Action Competes with histamine for H1-receptor sites on effector cells and inhibits the release of histamine and other mediators involved in the allergic response; when used intranasally, reduces hyper-reactivity of the airways; increases the motility of bronchial epithelial cilia, improving mucociliary transport Pharmacodynamics and Pharmacokinetics Onset of action: 15 to 30 minutes Tracy Gallegos 2008); maximum effect: 3 hours Duration: 12 hours Distribution: Vd: 14.5 L/kg Protein binding: Azelastine: ~88%; Desmethylazelastine: ~97% Metabolism: Hepatic via CYP; active metabolite, desmethylazelastine Bioavailability: ~40% Half-life elimination: Azelastine: 22 hours (0.1% solution), 25 hours (0.15% solution) to 25 hours; Desmethylazelastine: 52 hours (0.1% solution), 57 hours (0.15% solution) Time to peak, serum: 2 to 3 hours (Azelastine [generic] 0.1% solution); 3 to 4 hours (Astepro) Excretion: Feces (~75%, <10% as unchanged drug) Clearance: 0.5 L/hour/kg

## 2018-07-01 NOTE — Progress Notes (Signed)
Tracy Gallegos is a 32 y.o. female who presents today with concerns of fever and body aches for the past 3 days. She has not taken any medications other than tylenol and motrin for symptom management. She is 10 weeks post-partum and currently lactating. She denies any known sick contacts or others with similar symptoms. Spouse and both children in the home are well and asymptomatic. Of note she does report first public outing with baby since delivery was Sunday at church.  Review of Systems  Constitutional: Positive for chills, fever and malaise/fatigue.  HENT: Positive for congestion and sore throat. Negative for ear discharge, ear pain and sinus pain.   Eyes: Negative.   Respiratory: Positive for cough. Negative for sputum production and shortness of breath.   Cardiovascular: Negative.  Negative for chest pain.  Gastrointestinal: Negative for abdominal pain, diarrhea, nausea and vomiting.  Genitourinary: Negative for dysuria, frequency, hematuria and urgency.  Musculoskeletal: Negative for myalgias.  Skin: Negative.   Neurological: Negative for headaches.  Endo/Heme/Allergies: Negative.   Psychiatric/Behavioral: Negative.     O: Vitals:   07/01/18 1805  BP: 100/65  Pulse: 100  Resp: 18  Temp: 98.6 F (37 C)  SpO2: 96%     Physical Exam  Constitutional: She is oriented to person, place, and time. Vital signs are normal. She appears well-developed and well-nourished. She is active.  Non-toxic appearance. She does not have a sickly appearance.  HENT:  Head: Normocephalic.  Right Ear: Hearing, tympanic membrane, external ear and ear canal normal.  Left Ear: Hearing, tympanic membrane, external ear and ear canal normal.  Nose: Rhinorrhea present. Right sinus exhibits no maxillary sinus tenderness and no frontal sinus tenderness. Left sinus exhibits no maxillary sinus tenderness and no frontal sinus tenderness.  Mouth/Throat: Uvula is midline and oropharynx is clear and moist.  Tonsils are 0 on the right. Tonsils are 0 on the left. No tonsillar exudate.  Neck: Normal range of motion. Neck supple.  Cardiovascular: Normal rate, regular rhythm, normal heart sounds and normal pulses.  Pulmonary/Chest: Effort normal and breath sounds normal.  Patient breast feeding during exam- no evidence of concern for mastitis- infant feeding well.  Abdominal: Soft. Bowel sounds are normal.  Musculoskeletal: Normal range of motion.  Lymphadenopathy:       Head (right side): Tonsillar adenopathy present. No submental and no submandibular adenopathy present.       Head (left side): Tonsillar adenopathy present. No submental and no submandibular adenopathy present.    She has cervical adenopathy.       Right cervical: Superficial cervical adenopathy present.       Left cervical: Superficial cervical adenopathy present.  Neurological: She is alert and oriented to person, place, and time.  Psychiatric: She has a normal mood and affect.  Vitals reviewed.  A: 1. Flu-like symptoms    Discussed likely viral causes of symptoms- less likely bacterial based on exam and timeline. Rapid flu negative. Discussed symptomatic treatment options like nasal antihistamine and around the clock antipyretic use for fever and chills.  P: Discussed exam findings, diagnosis etiology and medication use and indications reviewed with patient. Follow- Up and discharge instructions provided. No emergent/urgent issues found on exam.  Patient verbalized understanding of information provided and agrees with plan of care (POC), all questions answered.  1. Flu-like symptoms - POCT Influenza A/B- NEGATIVE   - azelastine (ASTELIN) 0.1 % nasal spray; Place 1 spray into both nostrils 2 (two) times daily. Use in each nostril as directed -  ibuprofen (ADVIL,MOTRIN) 800 MG tablet; Take 1 tablet (800 mg total) by mouth every 8 (eight) hours as needed.

## 2018-07-28 MED FILL — FLUoxetine HCL 20 MG CAPS: 20 | 30 days supply | Qty: 60 | Fill #2

## 2018-08-17 ENCOUNTER — Ambulatory Visit: Payer: Self-pay

## 2018-08-17 NOTE — Lactation Note (Signed)
This note was copied from a baby's chart. 08/17/2018  Name: Tracy Gallegos MRN: [id number] Date of Birth: 03/30/2017 Gestational Age: Gestational Age: [redacted]w[redacted]d Birth Weight: 101.9 oz Weight today:    13 pounds 1.7 ounces (5944 grams) with clean size 1 diaper  Infant presents today with mom for feeding assessment. Dad and 31 yo sister was here briefly. Mom reports infant has not been gaining well and has fallen off his growth curve to the 2%.   Infant has gained 3254 grams in the last 138 days since d/c from the hospital  With an average daily weight gain of 24 grams a day. Infant gained 11 grams a day between 7/26 and 9/25 per doctor notes. Infant has gained 317 grams in the last 12 days with an average daily weight gain of 26 grams a day. Infant with tongue and lip revision between 2-3 months by Dr. Audie Pinto. Infant weight gain seems to be increasing in the last 2 weeks, mom and dad have worked diligently to feed infant more often although they do not feel like it is sustainable for them long term.   Infant is exclusively BF and mom and dad have tried multiple bottles to get infant to feed formula from, infant refuses. Mom is trying hard to get infant to take supplement to improve growth. Reviewed adding pumping to protect supply and to offer to infant as a supplement.   Infant is active and alert. Dad reports infant does not hold head up well for 49 month old. Dad is a resident in ICU @ [facility].   Infant awake, active and happy. He fed on both breasts and transferred well. Infant typically only feeds on one breast. Enc mom to offer both breasts with each feeding. Mom reports he usually refuses to nurse on both breasts.  Infant with moderately strong suckle on gloved finger, infant tongue thrusting and tends to push every thing out of his mouth. He will latch to the breast for short periods. Infant with healed tongue and upper lip frenulums and good tongue mobility. Mom shown suck training exercises to  begin on infant.   Parents know of a CST, dad does not want infant evaluated by CST. Suggested that infant be evaluated by Lawerance Bach, PT at [place] for feeding assessment and to assess muscle tone. Mom reports infant was a face presentation at birth.    We tried 5 french feeding tube at the breast, infant did not tolerate increased flow well and kept pushing tube out of his mouth. Tried cup feeding and bottle feeding with Fawn Kirk nipple and Nfant white ring nipple, infant overwhelmed by cup flow and nipple flow. Mom was given a Preemie Nfant nipple to try. Infant would tense up and arch when flow was noted from bottle or cup. We tried feeding in side lying position and he would not suckle on the bottle. Mom has tried the bottles as well as dad and GM. Mom reports they have tried Dr. Saul Fordyce and Dr. Saul Fordyce Preemie nipple also.   Mom given SNS to try at home as well as foley cup and Nfant nipples to try. Discussed trying finger feeding with 5 french feeding tube and/or SNS also.   Infant to follow up with Dr. Deborra Medina tomorrow. Infant to follow up with Lactation in 2 weeks.  Enc mom to bring infant to BF Support Groups for weight checks and weighted feeds.   Mom reports all questions/cocnerns have been answered. Mom to call with questions  as needed.     General Information: Mother's reason for visit: weight issues in the infant Consult: Initial Lactation consultant: Nonah Mattes RN,IBCLC Breastfeeding experience: nurses every 2-3 hours      Breastfeeding History: Frequency of breast feeding: every 2-3 hours Duration of feeding: 3-6 mintues each side, usually one breast per feeding  Supplementation: Supplement method: bottle Brand: Enfamil Formula volume: 16 ml x 1 Formula frequency: a few times a day, takes very little           Pump frequency: has not been pumping Pump volume: when pumped getting less tahn 5 ml per pumping  Infant Output  Assessment: Voids per 24 hours: 4-5 Urine color: Clear yellow Stools per 24 hours: 0-1 Stool color: Yellow  Breast Assessment: Breast: Soft, Compressible Nipple: Erect Pain level: 0 Pain interventions: Bra  Feeding Assessment: Infant oral assessment: Variance Infant oral assessment comment: infan tongue thrusting Positioning: Cross cradle(right breast) Latch: 2 - Grasps breast easily, tongue down, lips flanged, rhythmical sucking. Audible swallowing: 2 - Spontaneous and intermittent Type of nipple: 2 - Everted at rest and after stimulation Comfort: 2 - Soft/non-tender Hold: 2 - No assistance needed to correctly position infant at breast LATCH score: 10 Latch assessment: Deep Lips flanged: Yes Suck assessment: Displays both Tools: Syringe with 5 Fr feeding tube Pre-feed weight: 5944 grams/6038 grams Post feed weight: 6018 grams/6076 grams Amount transferred: 74 ml/38 ml Amount supplemented: 1 cc formula  Additional Feeding Assessment: Infant oral assessment: Variance Infant oral assessment comment: see above Positioning: Cross cradle(left breast) Latch: 2 - Grasps breast easily, tongue down, lips flanged, rhythmical sucking. Audible swallowing: 2 - Spontaneous and intermittent Type of nipple: 2 - Everted at rest and after stimulation Comfort: 2 - Soft/non-tender Hold: 2 - No assistance needed to correctly position infant at breast LATCH score: 10 Latch assessment: Deep Lips flanged: Yes Suck assessment: Displays both   Pre-feed weight: 6018 grams Post feed weight: 6038 grams Amount transferred: 20 ml Amount supplemented: 0  Totals: Total amount transferred: 132 ml Total supplement given: 1 Total amount pumped post feed: did not pump   Donn Pierini RN, IBCLC                                                    Donn Pierini 08/17/2018, 5:41 PM

## 2018-08-26 ENCOUNTER — Ambulatory Visit: Payer: 59 | Admitting: Family Medicine

## 2018-08-26 ENCOUNTER — Encounter: Payer: Self-pay | Admitting: Family Medicine

## 2018-08-26 VITALS — BP 110/72 | HR 82 | Temp 98.3°F | Ht 68.0 in | Wt 137.2 lb

## 2018-08-26 DIAGNOSIS — F418 Other specified anxiety disorders: Secondary | ICD-10-CM

## 2018-08-26 DIAGNOSIS — Z23 Encounter for immunization: Secondary | ICD-10-CM | POA: Diagnosis not present

## 2018-08-26 MED ORDER — FLUOXETINE HCL 10 MG PO CAPS: ORAL_CAPSULE | ORAL | 3 refills | Status: DC

## 2018-08-26 MED ORDER — PNV-SELECT 27-0.6-0.4 MG PO TABS: ORAL_TABLET | ORAL | 3 refills | Status: DC

## 2018-08-26 MED FILL — PNV-SELECT TABLET: 27-0.6-0.4 | 30 days supply | Qty: 30 | Fill #0

## 2018-08-26 MED FILL — FLUoxetine HCL 10 MG CAPS: 10 | 30 days supply | Qty: 60 | Fill #0

## 2018-08-26 NOTE — Assessment & Plan Note (Signed)
>  40 minutes spent in face to face time with patient, >50% spent in counselling or coordination of care. Depression and anxiety screens reassuring- no suicidal or homicidal thoughts. We agreed to increase her prozac to 50 mg daily for now, she will adjust dose and let me know how she is feeling in a few weeks.  Continue support group and meeting with lactation consults.  She is deferring psychotherapy and psychiatry referrals at this time.

## 2018-08-26 NOTE — Progress Notes (Signed)
Subjective:   Patient ID: Tracy Gallegos, female    DOB: 04/17/1986, 32 y.o.   MRN: [id number]  Tracy Gallegos is a pleasant 32 y.o. year old female who presents to clinic today with Depression (Patient is here today C/O Post-Partum Depression.   She has been upset about the new baby not feeding well and he was Dx as "Failure to Thrive" but is now doing ok.  She feels that she needs to go up on her Prozac.  She is also wanting Rx strength prenatal vitamins.  She would like to take a flu shot.)  on 08/26/2018  HPI:  Depression- established care with me on 06/30/18.  Note reviewed. Had her second baby in 03/2018, husband is a second year resident at [facility].  She has been on prozac since she was 32 years old.  She decreased the dose to 20 mg daily during pregnancy and had just increased it back to 40 mg daily a month or so before her initial appointment.  At that time, she felt she was doing well.  Now she is concerned that she has post partum/worsening depression and should increase her dose of prozac.  She has been upset that the baby is not feeding well and was diagnosed with failure to thrive but is now doing okay.  Has met with lactation consultants and support group which have helped a lot.  She is feeling less depressed and anxious then she was when she initially made this appointment.  No thoughts of harming herself or her children.  Current Outpatient Medications on File Prior to Visit  Medication Sig Dispense Refill  . FLUoxetine (PROZAC) 20 MG capsule Take 2 capsules (40 mg total) by mouth daily. 60 capsule 4  . Prenatal Vit-Fe Fumarate-FA (PRENATAL MULTIVITAMIN) TABS tablet Take 1 tablet by mouth daily at 12 noon.    Marland Kitchen amphetamine-dextroamphetamine (ADDERALL) 10 MG tablet dextroamphetamine-amphetamine 10 mg tablet    . amphetamine-dextroamphetamine (ADDERALL) 5 MG tablet dextroamphetamine-amphetamine 5 mg tablet     No current facility-administered medications on file prior  to visit.     Allergies  Allergen Reactions  . Nitrous Oxide Nausea And Vomiting    Patient would like to try during labor. States it has been 20 years since she experienced this.    Past Medical History:  Diagnosis Date  . Depression   . Endometriosis   . History of gestational diabetes 01/23/2018  . Ovarian cyst    Bilateral    Past Surgical History:  Procedure Laterality Date  . OVARIAN CYST SURGERY    . WISDOM TOOTH EXTRACTION      Family History  Problem Relation Age of Onset  . Hypertension Mother   . Obesity Mother   . Depression Mother   . Alcohol abuse Mother   . Heart disease Father   . Early death Father   . Obesity Brother   . Alcohol abuse Maternal Uncle   . Depression Maternal Uncle   . Hypertension Maternal Grandmother   . Depression Maternal Grandmother   . Alcohol abuse Maternal Grandmother   . Alzheimer's disease Maternal Grandmother   . Depression Maternal Grandfather   . Stroke Maternal Grandfather   . Heart disease Maternal Grandfather   . Suicidality Maternal Grandfather   . Suicidality Paternal Grandmother   . Depression Paternal Grandmother   . Hearing loss Paternal Grandfather     Social History   Socioeconomic History  . Marital status: Married    Spouse name: Not  on file  . Number of children: Not on file  . Years of education: Not on file  . Highest education level: Not on file  Occupational History  . Not on file  Social Needs  . Financial resource strain: Not on file  . Food insecurity:    Worry: Not on file    Inability: Not on file  . Transportation needs:    Medical: Not on file    Non-medical: Not on file  Tobacco Use  . Smoking status: Never Smoker  . Smokeless tobacco: Never Used  Substance and Sexual Activity  . Alcohol use: No  . Drug use: No  . Sexual activity: Yes    Birth control/protection: None  Lifestyle  . Physical activity:    Days per week: Not on file    Minutes per session: Not on file  .  Stress: Not on file  Relationships  . Social connections:    Talks on phone: Not on file    Gets together: Not on file    Attends religious service: Not on file    Active member of club or organization: Not on file    Attends meetings of clubs or organizations: Not on file    Relationship status: Not on file  . Intimate partner violence:    Fear of current or ex partner: Not on file    Emotionally abused: Not on file    Physically abused: Not on file    Forced sexual activity: Not on file  Other Topics Concern  . Not on file  Social History Narrative   Two children   Married to 2nd year family medicine resident at [facility]   The PMH, Isabel, Social History, Family History, Medications, and allergies have been reviewed in [facility], and have been updated if relevant.   Review of Systems  Constitutional: Positive for fatigue.  Psychiatric/Behavioral: Positive for decreased concentration, dysphoric mood and sleep disturbance. Negative for agitation, behavioral problems, confusion, hallucinations, self-injury and suicidal ideas. The patient is nervous/anxious. The patient is not hyperactive.   All other systems reviewed and are negative.      Objective:    BP 110/72 (BP Location: Left Arm, Patient Position: Sitting, Cuff Size: Normal)   Pulse 82   Temp 98.3 F (36.8 C) (Oral)   Ht _0  (1.727 m)   Wt 137 lb 3.2 oz (62.2 kg)   SpO2 96%   Breastfeeding? Yes   BMI 20.86 kg/m    Physical Exam  Constitutional: She is oriented to person, place, and time. She appears well-developed and well-nourished. No distress.  HENT:  Head: Normocephalic and atraumatic.  Eyes: EOM are normal.  Neck: Normal range of motion.  Cardiovascular: Normal rate.  Pulmonary/Chest: Effort normal.  Musculoskeletal: Normal range of motion. She exhibits no edema.  Neurological: She is alert and oriented to person, place, and time. No cranial nerve deficit.  Skin: Skin is warm and dry. She is not diaphoretic.    Psychiatric: She has a normal mood and affect. Her behavior is normal. Judgment and thought content normal.  Nursing note and vitals reviewed.         Assessment & Plan:   Need for influenza vaccination - Plan: Flu Vaccine QUAD 6+ mos PF IM (Fluarix Quad PF)  Depression with anxiety No follow-ups on file.

## 2018-08-26 NOTE — Patient Instructions (Signed)
Great to see you. Please keep me updated. 

## 2018-08-31 ENCOUNTER — Ambulatory Visit: Payer: Self-pay

## 2018-08-31 NOTE — Lactation Note (Signed)
This note was copied from a baby's chart. 08/31/2018  Name: Tracy Gallegos MRN: [id number] Date of Birth: 03/30/2017 Gestational Age: Gestational Age: [redacted]w[redacted]d Birth Weight: 101.9 oz Weight today:    13 pounds 8.5 ounces (6138 grams) naked, 13 pounds 9.1 ounces (6154 grams) with clean size 2 diaper  Infant presents today with mom for follow up feeding assessment.   Infant has gained 210 grams in the last 14 days with an average daily weight gain of 15 grams a day. 15 grams a day is within normal limits for an exclusive BF infant between 49-22 months of age. Infant has more than doubled his birthweight before 77 months of age.   Infant last ate about 2 hours ago. Mom reports he feeds about 8-10 x a day. When he starts on the right breast, he will not take the Left breast. When he starts on the left breast, he generally takes both breasts.  Mom has used the SNS a few times with EBM. She has been trying to feed him with a Nano baby bottle with no success. Mom is not pumping but is hand expressing.   Mom reports she has been contacted by PT and has not been able to call then back to set up an appt yet.   Mom fed infant on the left breast first, infant ate for about 5 minutes and transferred 50 ml. Infant then latched to the right breast in the cross cradle hold, infant was not interested in feeding and bit mom. Infant distracted, lights dimmed. After about 5 minutes, infant latched back to the left breast for a few minutes, mom was able to get him latched to the right breast, infant did not feed for long. Mom tried very hard to get infant to feed. Infant did take a short nap and did reawaken and relatched to the breast.   Infant has been started on Oatmeal with EBM added. He spits it out and gags on it per mom. They have been trying bottles and infant will not take them.   Mom seems very anxious that she has caused infant to have difficulty feeding. Mom reports she pushed for infant to have  Frenotomy and feels infant stopped eating well at that time. Mom does not have a therapist that she sees, she reports she may need to.   Mom has limited help at home. Dad works long hours.They have no family in the area. She is attending BF Support Groups.  Mom says she has no thoughts of harming herself or her children. Mom has spoken with Dr. Deborra Medina about how she is feeling and coping. Mom is very loving to infant and infant smiles frequently at mom. Her mom is here visiting and has been able to get infant to take a bottle in the past, she is planning to try.   Enc mom to try a straw, sippy cup. Regular cup and keep trying with the bottles. Mom feels the SNS is too difficult.   Infant to follow up with Dr. Deborra Medina on Nov 21st. Infant is switching from Dr. Reesa Chew to Dr. Deborra Medina and this was her first available appt. Mom is seeing Dr. Deborra Medina for herself, she has increased mom's Prozac dosage. Enc mom to have infant evaluated by PT and see about having infant seen in the [facility] at [place] has appt scheduled with [facility] on Oct 23. Mom was not previously aware of the appt. Mom would like to follow up with Lactation  in 2 weeks.     General Information: Mother's reason for visit: Follow up Feeding assessment Consult: Follow-up Lactation consultant: Nonah Mattes RN,IBCLC Breastfeeding experience: Nurses 8-10 x a day   Maternal medications: Other, Pre-natal vitamin(Prozac 50 mg every other day, 40 mg QD (increasing to 50 mg QD))  Breastfeeding History: Frequency of breast feeding: 8-10 x a day Duration of feeding: 5-6 minutes per breast, one-two breasts per feeding, mom offers both, infant will refuse  Supplementation: Supplement method: bottle                 Pump frequency: not been pumping Pump volume: 3-5 ml  Infant Output Assessment: Voids per 24 hours: 5-6 Urine color: Clear yellow Stools per 24 hours: 0-1 Stool color: Yellow  Breast Assessment: Breast: Soft,  Compressible Nipple: Erect Pain level: 0 Pain interventions: Bra  Feeding Assessment: Infant oral assessment: Variance Infant oral assessment comment: infant with high palate. infant with good tongue mobility, infant tongue thrusting when anything placed in his mouth Positioning: Cross cradle(left breast) Latch: 1 - Repeated attempts needed to sustain latch, nipple held in mouth throughout feeding, stimulation needed to elicit sucking reflex. Audible swallowing: 2 - Spontaneous and intermittent Type of nipple: 2 - Everted at rest and after stimulation Comfort: 2 - Soft/non-tender Hold: 2 - No assistance needed to correctly position infant at breast LATCH score: 9 Latch assessment: Deep Lips flanged: No Suck assessment: Displays both   Pre-feed weight: 6154 grams Post Feed weight: 6232 Amount Transferred: 82 ml Amount supplemented: 0  Additional Feeding Assessment:                                    Totals: Total amount transferred: 62 ml Total supplement given: 0 Total amount pumped post feed: did not pump   Plan:  1. Offer breast with feeding cues, with goal of at least 8 feedings a day 2. Offer infant both breasts with each feeding 3. Can try supplementing infant at the breast with the 5 french feeding tube at the breast or the SNS at the breast, can fill with breast milk or formula 4. Can try offering breast milk or formula in a cup after breast feeding also 5. If using a bottle, try the Nfant nipples 6. Goal is for infant to take 25-30 ounces in 24 hours 7. Suck training exercises 5-6 x a day before or after feeding for 1-2 minutes each exercise for the next few weeks 8. Call and schedule your PT appointment for infant 55. Infant may need to see Speech Language Pathologist or be referred to the [facility] for evaluation 10. Keep up the good work 56. Thank you for allowing me to assist you today  12. Call with questions/concerns as  needed [phone] 13. Follow up with Lactation in 2 weeks    [place], IBCLC                                                    Donn Pierini 08/31/2018, 2:11 PM

## 2018-09-08 ENCOUNTER — Encounter: Payer: Self-pay | Admitting: Family Medicine

## 2018-09-10 ENCOUNTER — Other Ambulatory Visit: Payer: Self-pay | Admitting: Family Medicine

## 2018-09-10 DIAGNOSIS — F418 Other specified anxiety disorders: Secondary | ICD-10-CM

## 2018-09-14 ENCOUNTER — Ambulatory Visit: Payer: Self-pay

## 2018-09-14 NOTE — Lactation Note (Signed)
This note was copied from a baby's chart. 09/14/2018  Name: Tracy Gallegos MRN: [id number] Date of Birth: 03/30/2017 Gestational Age: Gestational Age: [redacted]w[redacted]d Birth Weight: 101.9 oz Weight today:    Today's Weight 14 pounds 0.5 ounces (6366 grams) naked, 14 pounds 1.6 ounces (6398 grams) with clean size 2 diaper  Infant presents today with mom for follow up feeding assessment.   Infant has gained 228 grams in the last 14 days with an average daily weight gain of 16 grams a day. (weight calculated on naked weight)  Infant has been feeding on demand for short intervals (up to 8 minutes). Mom offers the breast with any feeding cues. Infant has been started on Oatmeal mixed with EBM via spoon at direction of SLP Leretha Dykes. Infant is thought to have an oral eversion. Infant nurses longer at the breast when sleepy vs wide awake.   Infant is being followed by PT, Neurology, Genetics, Ped, and SLP. Mom reports the test for SMA is reported to be negative at this time. Infant to see Endocrinology in the next few weeks.   Mom reports they are having a difficult time with all that is going on with Tracy Gallegos. They have a strong faith and have had family to come and help out as they can. Mom in good spirits has good follow care of herself.  Infant did not feed well in the office. Mom good at massaging/compressing breast with feeding as needed. .   Infant to follow up with PT weekly. Infant to follow up with Dr. Tamera Punt Nov 20th. Infant to follow up with Lactation as needed. Mom has been attending BF and Mommy and Me Support Groups.     General Information: Mother's reason for visit: Followup feeding assessment Consult: Follow-up Lactation consultant: Nonah Mattes RN,IBCLC Breastfeeding experience: Nurses 8-10 x a day   Maternal medications: Pre-natal vitamin, Other(Prozac QD)  Breastfeeding History: Frequency of breast feeding: 8-10 x a day Duration of feeding: 5-8 x per breast, one or both  breasts per feeding  Supplementation: Supplement method: spoon(EBM with Oatmeal BID)         Breast milk volume: 1-2 tsp            Infant Output Assessment: Voids per 24 hours: 5-6 Urine color: Clear yellow Stools per 24 hours: 1-4 days Stool color: Yellow  Breast Assessment: Breast: Filling, Compressible Nipple: Erect Pain level: 0    Feeding Assessment: Infant oral assessment: Variance Infant oral assessment comment: infant with high palate, infant with good tongue mobility. Infant with tongue thrusting and noted to stick tongue out often.  Positioning: Cross cradle(left breast) Latch: 1 - Repeated attempts needed to sustain latch, nipple held in mouth throughout feeding, stimulation needed to elicit sucking reflex. Audible swallowing: 1 - A few with stimulation Type of nipple: 2 - Everted at rest and after stimulation Comfort: 2 - Soft/non-tender Hold: 2 - No assistance needed to correctly position infant at breast LATCH score: 8 Latch assessment: Deep Lips flanged: Yes Suck assessment: Displays both   Pre-feed weight: 6398 grams Post feed weight: 6430 grams Amount transferred: 32 ml Amount supplemented: 0  Additional Feeding Assessment:                                    Totals: Total amount transferred: 32 ml Total supplement given: 0 Total amount pumped post feed: did not pump   Plan:  1. Offer  breast with feeding cues, with goal of at least 8 feedings a day 2. Offer infant both breasts with each feeding 3. Can try offering breast milk in a cup or spoon if infant will take it 4. Continue to attend Breast feeding support Groups as you are able 5. Thank you for allowing me to assist you today  6. Call with questions/concerns as needed [phone] 7. Follow up with Lactation as needed    Donn Pierini RN, IBCLC                                                        Debby Freiberg  [place] 09/14/2018, 2:11 PM

## 2018-09-15 ENCOUNTER — Encounter: Payer: Self-pay | Admitting: Family Medicine

## 2018-09-15 ENCOUNTER — Ambulatory Visit: Payer: 59 | Admitting: Family Medicine

## 2018-09-15 VITALS — BP 100/78 | HR 85 | Temp 98.5°F | Resp 16 | Ht 68.0 in | Wt 136.6 lb

## 2018-09-15 DIAGNOSIS — F418 Other specified anxiety disorders: Secondary | ICD-10-CM

## 2018-09-15 NOTE — Progress Notes (Signed)
Subjective:   Patient ID: Tracy Gallegos, female    DOB: [birth year], 32 y.o.   MRN: [id number]  Tracy Gallegos is a pleasant 32 y.o. year old female who presents to clinic today with Depression (Pt here for follow up on postpartum depression)  on 09/15/2018  HPI:  Currently taking Prozac 60 mg daily.  She feels she needs to continue this for several months, until things settle with her son.  Still awaiting a diagnosis for his hypotonia, likely having an MRI next week. Ota has felt markedly less anxious and depressed since SMA testing came back negative this week.  Also her son, is starting to show increased tone and feeding better.     Current Outpatient Medications on File Prior to Visit  Medication Sig Dispense Refill  . amphetamine-dextroamphetamine (ADDERALL) 10 MG tablet dextroamphetamine-amphetamine 10 mg tablet    . amphetamine-dextroamphetamine (ADDERALL) 5 MG tablet dextroamphetamine-amphetamine 5 mg tablet    . FLUoxetine (PROZAC) 10 MG capsule Take 1-2 caps daily as directed, to be taken along with two 20 mg capsules daily 60 capsule 3  . FLUoxetine (PROZAC) 20 MG capsule Take 2 capsules (40 mg total) by mouth daily. 60 capsule 4  . Prenatal Vit w/Fe-Methylfol-FA (PNV-SELECT) 27-0.6-0.4 MG TABS 1 tablet by mouth daily. 30 tablet 3  . Prenatal Vit-Fe Fumarate-FA (PRENATAL MULTIVITAMIN) TABS tablet Take 1 tablet by mouth daily at 12 noon.     No current facility-administered medications on file prior to visit.     Allergies  Allergen Reactions  . Nitrous Oxide Nausea And Vomiting    Patient would like to try during labor. States it has been 20 years since she experienced this.    Past Medical History:  Diagnosis Date  . Depression   . Endometriosis   . History of gestational diabetes 01/23/2018  . Ovarian cyst    Bilateral    Past Surgical History:  Procedure Laterality Date  . OVARIAN CYST SURGERY    . WISDOM TOOTH EXTRACTION      Family  History  Problem Relation Age of Onset  . Hypertension Mother   . Obesity Mother   . Depression Mother   . Alcohol abuse Mother   . Heart disease Father   . Early death Father   . Obesity Brother   . Alcohol abuse Maternal Uncle   . Depression Maternal Uncle   . Hypertension Maternal Grandmother   . Depression Maternal Grandmother   . Alcohol abuse Maternal Grandmother   . Alzheimer's disease Maternal Grandmother   . Depression Maternal Grandfather   . Stroke Maternal Grandfather   . Heart disease Maternal Grandfather   . Suicidality Maternal Grandfather   . Suicidality Paternal Grandmother   . Depression Paternal Grandmother   . Hearing loss Paternal Grandfather     Social History   Socioeconomic History  . Marital status: Married    Spouse name: Not on file  . Number of children: Not on file  . Years of education: Not on file  . Highest education level: Not on file  Occupational History  . Not on file  Social Needs  . Financial resource strain: Not on file  . Food insecurity:    Worry: Not on file    Inability: Not on file  . Transportation needs:    Medical: Not on file    Non-medical: Not on file  Tobacco Use  . Smoking status: Never Smoker  . Smokeless tobacco: Never Used  Substance and Sexual  Activity  . Alcohol use: No  . Drug use: No  . Sexual activity: Yes    Birth control/protection: None  Lifestyle  . Physical activity:    Days per week: Not on file    Minutes per session: Not on file  . Stress: Not on file  Relationships  . Social connections:    Talks on phone: Not on file    Gets together: Not on file    Attends religious service: Not on file    Active member of club or organization: Not on file    Attends meetings of clubs or organizations: Not on file    Relationship status: Not on file  . Intimate partner violence:    Fear of current or ex partner: Not on file    Emotionally abused: Not on file    Physically abused: Not on file     Forced sexual activity: Not on file  Other Topics Concern  . Not on file  Social History Narrative   Two children   Married to 2nd year family medicine resident at [employer]   The PMH, [place], Social History, Family History, Medications, and allergies have been reviewed in [facility], and have been updated if relevant.   Review of Systems  Constitutional: Negative.   Respiratory: Negative.   Cardiovascular: Negative.   Psychiatric/Behavioral: Negative.   All other systems reviewed and are negative.      Objective:    BP 100/78   Pulse 85   Temp 98.5 F (36.9 C) (Oral)   Resp 16   Ht 5\' 8"  (1.727 m)   Wt 136 lb 9.6 oz (62 kg)   SpO2 98%   BMI 20.77 kg/m    Physical Exam  Constitutional: She is oriented to person, place, and time. She appears well-developed and well-nourished. No distress.  HENT:  Head: Normocephalic and atraumatic.  Eyes: EOM are normal.  Neck: Normal range of motion.  Cardiovascular: Normal rate and regular rhythm.  Pulmonary/Chest: Effort normal and breath sounds normal.  Musculoskeletal: Normal range of motion. She exhibits no edema.  Neurological: She is alert and oriented to person, place, and time. No cranial nerve deficit.  Skin: Skin is warm and dry. She is not diaphoretic.  Psychiatric: She has a normal mood and affect. Her behavior is normal. Judgment and thought content normal.  Nursing note and vitals reviewed.         Assessment & Plan:   Depression with anxiety No follow-ups on file.

## 2018-09-15 NOTE — Assessment & Plan Note (Signed)
Tracy Gallegos has no SI or HI and feels like her reactions to what she has been going through with her son have been appropriate and I agree with that.  I did refer her to a therapist and she plans on keeping those appointments.  Continue current dose of prozac.  She will keep me updated. >25 minutes spent in face to face time with patient, >50% spent in counselling or coordination of care

## 2018-09-28 MED FILL — FLUoxetine HCL 20 MG CAPS: 20 | 30 days supply | Qty: 60 | Fill #3

## 2018-09-28 MED FILL — PNV-SELECT TABLET: 27-0.6-0.4 | 30 days supply | Qty: 30 | Fill #1

## 2018-10-16 ENCOUNTER — Telehealth: Payer: Self-pay | Admitting: Family Medicine

## 2018-10-16 DIAGNOSIS — F418 Other specified anxiety disorders: Secondary | ICD-10-CM

## 2018-10-16 MED ORDER — BUSPIRONE HCL 7.5 MG PO TABS: 7.5000 mg | ORAL_TABLET | Freq: Three times a day (TID) | ORAL | 0 refills | Status: DC

## 2018-10-16 MED FILL — busPIRone HCL 7.5 MG TABS: 7.5 | 10 days supply | Qty: 30 | Fill #0

## 2018-10-16 NOTE — Telephone Encounter (Signed)
Left VM to return call 

## 2018-10-16 NOTE — Telephone Encounter (Unsigned)
Copied from [facility] [phone]. Topic: General - Other >> Oct 16, 2018  2:32 PM Parke Poisson wrote: Reason for CRM: Pt's husband Leroy Sea called stating that their son is having a lot of medical problems and it has really taken a toll.Pt is having anxiety to the point she is losing her ability to speak at times.Please advise

## 2018-10-27 ENCOUNTER — Ambulatory Visit: Payer: Self-pay | Admitting: Family Medicine

## 2018-11-03 ENCOUNTER — Encounter: Payer: Self-pay | Admitting: Family Medicine

## 2018-11-05 ENCOUNTER — Encounter: Payer: Self-pay | Admitting: Family Medicine

## 2018-11-05 ENCOUNTER — Ambulatory Visit: Payer: 59 | Admitting: Family Medicine

## 2018-11-05 VITALS — BP 110/62 | HR 98 | Temp 98.1°F | Ht 68.0 in | Wt 136.2 lb

## 2018-11-05 DIAGNOSIS — F418 Other specified anxiety disorders: Secondary | ICD-10-CM | POA: Diagnosis not present

## 2018-11-05 NOTE — Progress Notes (Signed)
Subjective:   Patient ID: Tracy Gallegos, female    DOB: 03/12/1986, 32 y.o.   MRN: [id number]  Tracy Gallegos is a pleasant 32 y.o. year old female who presents to clinic today with Depression with Anxiety (Patient is here today to discuss increasing anxiety.  Per phone note "Pt's husband Leroy Sea called stating that their son is having a lot of medical problems and it has really taken a toll.Pt is having anxiety to the point she is losing her ability to speak at times."  She has been scheduled with Bambi on 1.22.20.  Having feelings of fatigue, exhaustion, and being very overwhelmed with son. Had blood transfusion on 5.23.19.)  on 11/05/2018  HPI:  Received email this morning from pt stating the following-  Dr. Deborra Medina,  I wanted to give you some background information regarding my upcoming visit on 11-05-18. 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 We are very saddened and are having to rethink everything, quite literally everything. Please feel free to add yourself to [facility] care team (if you have not already) in order to view his results. Leroy Sea and I are meeting with [facility] genetics team to pursue parental [place] next Monday. I am currently taking 60mg  fluoxetine and the buspar that you (or your colleague) called in, though only 1-2 tablets per day (7.5 to 15mg ). As you know, I am very conservative with new medications. Things have been very difficult; Brad and I are heartbroken and trying to cope with this new reality as best as we can.  Kindel   She is taking Prozac 60 mg daily. She has not yet increased her Buspar to 7.5 mg three times daily.  So far tolerating Buspar 7.5 mg twice daily.  She denies any SI or HI but of course she is sad, tearful and  feeling devastated.  States she would never commit suicide or harm her children because of her children and how much she loves them.  She was suicidal as a teenager and her husband is aware of this.  She assures me that she would tell her husband and or go straight to the ER if she ever developed SI or HI. Current Outpatient Medications on File Prior to Visit  Medication Sig Dispense Refill  . amphetamine-dextroamphetamine (ADDERALL) 10 MG tablet dextroamphetamine-amphetamine 10 mg tablet    . amphetamine-dextroamphetamine (ADDERALL) 5 MG tablet dextroamphetamine-amphetamine 5 mg tablet    . busPIRone (BUSPAR) 7.5 MG tablet Take 1 tablet (7.5 mg total) by mouth 3 (three) times daily. 30 tablet 0  . FLUoxetine (PROZAC) 10 MG capsule Take 1-2 caps daily as directed, to be taken along with two 20 mg capsules daily 60 capsule 3  . FLUoxetine (PROZAC) 20 MG capsule Take 2 capsules (40 mg total) by mouth daily. 60 capsule 4  . Prenatal Vit w/Fe-Methylfol-FA (PNV-SELECT) 27-0.6-0.4 MG TABS 1 tablet by mouth daily. 30 tablet 3  . Prenatal Vit-Fe Fumarate-FA (PRENATAL MULTIVITAMIN) TABS tablet Take 1 tablet by mouth daily at 12 noon.     No current facility-administered medications on file prior to visit.     Allergies  Allergen Reactions  . Nitrous Oxide Nausea And Vomiting    Patient would like to try during labor. States it has been 20 years since she experienced this.  Past Medical History:  Diagnosis Date  . Depression   . Endometriosis   . History of gestational diabetes 01/23/2018  . Ovarian cyst    Bilateral    Past Surgical History:  Procedure Laterality Date  . OVARIAN CYST SURGERY    . WISDOM TOOTH EXTRACTION      Family History  Problem Relation Age of Onset  . Hypertension Mother   . Obesity Mother   . Depression Mother   . Alcohol abuse Mother   . Heart disease Father   . Early death Father   . Obesity Brother   . Alcohol abuse Maternal Uncle   . Depression Maternal  Uncle   . Hypertension Maternal Grandmother   . Depression Maternal Grandmother   . Alcohol abuse Maternal Grandmother   . Alzheimer's disease Maternal Grandmother   . Depression Maternal Grandfather   . Stroke Maternal Grandfather   . Heart disease Maternal Grandfather   . Suicidality Maternal Grandfather   . Suicidality Paternal Grandmother   . Depression Paternal Grandmother   . Hearing loss Paternal Grandfather     Social History   Socioeconomic History  . Marital status: Married    Spouse name: Not on file  . Number of children: Not on file  . Years of education: Not on file  . Highest education level: Not on file  Occupational History  . Not on file  Social Needs  . Financial resource strain: Not on file  . Food insecurity:    Worry: Not on file    Inability: Not on file  . Transportation needs:    Medical: Not on file    Non-medical: Not on file  Tobacco Use  . Smoking status: Never Smoker  . Smokeless tobacco: Never Used  Substance and Sexual Activity  . Alcohol use: No  . Drug use: No  . Sexual activity: Yes    Birth control/protection: None  Lifestyle  . Physical activity:    Days per week: Not on file    Minutes per session: Not on file  . Stress: Not on file  Relationships  . Social connections:    Talks on phone: Not on file    Gets together: Not on file    Attends religious service: Not on file    Active member of club or organization: Not on file    Attends meetings of clubs or organizations: Not on file    Relationship status: Not on file  . Intimate partner violence:    Fear of current or ex partner: Not on file    Emotionally abused: Not on file    Physically abused: Not on file    Forced sexual activity: Not on file  Other Topics Concern  . Not on file  Social History Narrative   Two children   Married to 2nd year family medicine resident at [employer]   The PMH, [place], Social History, Family History, Medications, and allergies have been  reviewed in [facility], and have been updated if relevant.  Review of Systems  Constitutional: Positive for activity change and fatigue.  Psychiatric/Behavioral: Positive for decreased concentration, dysphoric mood and sleep disturbance. Negative for agitation, behavioral problems, confusion, hallucinations, self-injury and suicidal ideas. The patient is not nervous/anxious and is not hyperactive.   All other systems reviewed and are negative.      Objective:    BP 110/62   Pulse 98   Temp 98.1 F (36.7 C) (Oral)   Ht 5\' 8"  (1.727 m)  Wt 136 lb 3.2 oz (61.8 kg)   SpO2 98%   BMI 20.71 kg/m  Wt Readings from Last 3 Encounters:  11/05/18 136 lb 3.2 oz (61.8 kg)  09/15/18 136 lb 9.6 oz (62 kg)  08/26/18 137 lb 3.2 oz (62.2 kg)     Physical Exam Vitals signs and nursing note reviewed.  Constitutional:      General: She is not in acute distress. HENT:     Head: Normocephalic and atraumatic.  Eyes:     Extraocular Movements: Extraocular movements intact.  Musculoskeletal: Normal range of motion.  Neurological:     General: No focal deficit present.     Mental Status: She is alert and oriented to person, place, and time.  Psychiatric:        Attention and Perception: Attention normal.        Mood and Affect: Affect is tearful.        Speech: Speech normal.        Behavior: Behavior is cooperative.        Thought Content: Thought content normal.        Cognition and Memory: Cognition normal.        Judgment: Judgment normal.           Assessment & Plan:   Depression with anxiety No follow-ups on file.

## 2018-11-05 NOTE — Assessment & Plan Note (Signed)
>  40 minutes spent in face to face time with patient, >50% spent in counselling or coordination of care. Kelsa understandably feels devastated and sad.  She is questioning her faith which has always been an important source of purpose and reassurance for her.  She does not want to change her rxs today.  She just wanted to come in to update me.  She denies any SI or HI but of course she is sad, tearful and feeling devastated.  States she would never commit suicide or harm her children because of her children and how much she loves them.  She was suicidal as a teenager and her husband is aware of this.  She assures me that she would tell her husband and or go straight to the ER if she ever developed SI or HI.  She has an appointment with Bambi Cottle next month.  I also suggested she try calling [facility] number given to pt.  She is attending a mom breast feeding support group which has been very helpful for her.

## 2018-11-05 NOTE — Patient Instructions (Addendum)
It was so good to see you.  I am so sorry that you're going through such a tough time.  [place]- [phone]. Request Burnetta Sabin.  If any difficulty, let me know- [phone] ( my cell phone).

## 2018-11-09 DIAGNOSIS — Z8481 Family history of carrier of genetic disease: Secondary | ICD-10-CM | POA: Diagnosis not present

## 2018-11-09 DIAGNOSIS — Z1379 Encounter for other screening for genetic and chromosomal anomalies: Secondary | ICD-10-CM | POA: Diagnosis not present

## 2018-11-14 ENCOUNTER — Encounter: Payer: Self-pay | Admitting: Family Medicine

## 2018-11-18 ENCOUNTER — Other Ambulatory Visit: Payer: Self-pay | Admitting: Nurse Practitioner

## 2018-11-18 DIAGNOSIS — F418 Other specified anxiety disorders: Secondary | ICD-10-CM

## 2018-11-18 MED FILL — FLUoxetine HCL 10 MG CAPS: 10 | 30 days supply | Qty: 60 | Fill #1

## 2018-11-18 MED FILL — FLUoxetine HCL 20 MG CAPS: 20 | 30 days supply | Qty: 60 | Fill #4

## 2018-11-18 MED FILL — PNV-SELECT TABLET: 27-0.6-0.4 | 30 days supply | Qty: 30 | Fill #2

## 2018-11-19 ENCOUNTER — Other Ambulatory Visit: Payer: Self-pay

## 2018-11-19 DIAGNOSIS — Z8632 Personal history of gestational diabetes: Secondary | ICD-10-CM

## 2018-11-20 DIAGNOSIS — F4323 Adjustment disorder with mixed anxiety and depressed mood: Secondary | ICD-10-CM | POA: Diagnosis not present

## 2018-11-20 MED ORDER — BUSPIRONE HCL 7.5 MG PO TABS: 7.5000 mg | ORAL_TABLET | Freq: Three times a day (TID) | ORAL | 1 refills | Status: DC

## 2018-11-20 MED FILL — busPIRone HCL 7.5 MG TABS: 7.5 | 30 days supply | Qty: 90 | Fill #0

## 2018-11-20 NOTE — Addendum Note (Signed)
Addended by: Wilfred Lacy L on: 11/20/2018 11:52 AM   Modules accepted: Orders

## 2018-11-26 ENCOUNTER — Other Ambulatory Visit: Payer: Self-pay

## 2018-11-27 ENCOUNTER — Other Ambulatory Visit: Payer: Self-pay

## 2018-11-28 ENCOUNTER — Ambulatory Visit (HOSPITAL_COMMUNITY)
Admission: EM | Admit: 2018-11-28 | Discharge: 2018-11-28 | Disposition: A | Discharge status: Home / Self Care | Payer: 59 | Attending: Family Medicine | Admitting: Family Medicine

## 2018-11-28 ENCOUNTER — Ambulatory Visit (INDEPENDENT_AMBULATORY_CARE_PROVIDER_SITE_OTHER): Payer: 59

## 2018-11-28 ENCOUNTER — Encounter (HOSPITAL_COMMUNITY): Payer: Self-pay | Admitting: Emergency Medicine

## 2018-11-28 DIAGNOSIS — M79671 Pain in right foot: Secondary | ICD-10-CM

## 2018-11-28 DIAGNOSIS — S99921A Unspecified injury of right foot, initial encounter: Secondary | ICD-10-CM | POA: Diagnosis not present

## 2018-11-28 LAB — POCT PREGNANCY, URINE: PREG TEST UR: NEGATIVE

## 2018-11-28 NOTE — ED Triage Notes (Signed)
Pt states she kicked her foot against the wall and would like an xray to see if she has any broken toes.

## 2018-11-30 ENCOUNTER — Other Ambulatory Visit: Payer: Self-pay

## 2018-11-30 NOTE — ED Provider Notes (Signed)
[place]   [id number] 11/28/18 Arrival Time: 3244  ASSESSMENT & PLAN:  1. Foot pain, right     I have personally viewed the imaging studies ordered this visit. No foot/toe fractures or dislocations seen.  Imaging: Dg Foot Complete Right  Result Date: 11/28/2018 CLINICAL DATA:  Blunt trauma to the foot with pain, initial encounter EXAM: RIGHT FOOT COMPLETE - 3+ VIEW COMPARISON:  None. FINDINGS: There is no evidence of fracture or dislocation. There is no evidence of arthropathy or other focal bone abnormality. Soft tissues are unremarkable. IMPRESSION: No acute abnormality noted. Electronically Signed   By: Inez Catalina M.D.   On: 11/28/2018 17:42    Orders Placed This Encounter  Procedures  . DG Foot Complete Right  . Pregnancy, urine POC  UPT negative.  Follow-up Information    Lucille Passy, MD.   Specialty:  Family Medicine Why:  As needed. Contact information: [place] [address] [phone]          Declines cast shoe. Able to wear her normal shoes. OTC analgesics if needed. Expect gradual improvement. WBAT.  Reviewed expectations re: course of current medical issues. Questions answered. Outlined signs and symptoms indicating need for more acute intervention. Patient verbalized understanding. After Visit Summary given.   SUBJECTIVE: History from: patient. Tracy Gallegos is a 33 y.o. female who reports persistent mild to moderate pain of her right foot; described as aching without radiation. Onset: abrupt, today. Injury/trama: yes, reports hitting her right foot on a wall today; barefooted; "basically kicked the wall by accident". Able to bear weight but with distal foot discomfort. Symptoms have progressed to a point and plateaued since beginning. Aggravating factors: weight bearing. Alleviating factors: rest. Associated symptoms: none reported. Extremity sensation changes or weakness: none. Self treatment:  has not tried OTCs for relief of pain. History of similar: no.  Past Surgical History:  Procedure Laterality Date  . OVARIAN CYST SURGERY    . WISDOM TOOTH EXTRACTION      ROS: As per HPI. All other systems negative   OBJECTIVE:  Vitals:   11/28/18 1651  BP: 105/70  Pulse: 69  Resp: 18  Temp: 97.6 F (36.4 C)  SpO2: 99%    General appearance: alert; no distress Extremities: . RLE: warm and well perfused; poorly localized moderate tenderness over right distal dorsal foot; without gross deformities; with no swelling; with no bruising; ROM: normal CV: brisk extremity capillary refill of RLE; 2+ DP and PT pulse of RLE. Skin: warm and dry; no visible rashes Neurologic: gait normal; normal reflexes of RLE and LLE; normal sensation of RLE and LLE; normal strength of RLE and LLE Psychological: alert and cooperative; normal mood and affect  Allergies  Allergen Reactions  . Nitrous Oxide Nausea And Vomiting    Patient would like to try during labor. States it has been 20 years since she experienced this.    Past Medical History:  Diagnosis Date  . Depression   . Endometriosis   . History of gestational diabetes 01/23/2018  . Ovarian cyst    Bilateral   Social History   Socioeconomic History  . Marital status: Married    Spouse name: Not on file  . Number of children: Not on file  . Years of education: Not on file  . Highest education level: Not on file  Occupational History  . Not on file  Social Needs  . Financial resource strain: Not on file  . Food insecurity:  Worry: Not on file    Inability: Not on file  . Transportation needs:    Medical: Not on file    Non-medical: Not on file  Tobacco Use  . Smoking status: Never Smoker  . Smokeless tobacco: Never Used  Substance and Sexual Activity  . Alcohol use: No  . Drug use: No  . Sexual activity: Yes    Birth control/protection: None  Lifestyle  . Physical activity:    Days per week: Not on file    Minutes  per session: Not on file  . Stress: Not on file  Relationships  . Social connections:    Talks on phone: Not on file    Gets together: Not on file    Attends religious service: Not on file    Active member of club or organization: Not on file    Attends meetings of clubs or organizations: Not on file    Relationship status: Not on file  Other Topics Concern  . Not on file  Social History Narrative   Two children   Married to 2nd year family medicine resident at [employer]   Family History  Problem Relation Age of Onset  . Hypertension Mother   . Obesity Mother   . Depression Mother   . Alcohol abuse Mother   . Heart disease Father   . Early death Father   . Obesity Brother   . Alcohol abuse Maternal Uncle   . Depression Maternal Uncle   . Hypertension Maternal Grandmother   . Depression Maternal Grandmother   . Alcohol abuse Maternal Grandmother   . Alzheimer's disease Maternal Grandmother   . Depression Maternal Grandfather   . Stroke Maternal Grandfather   . Heart disease Maternal Grandfather   . Suicidality Maternal Grandfather   . Suicidality Paternal Grandmother   . Depression Paternal Grandmother   . Hearing loss Paternal Grandfather    Past Surgical History:  Procedure Laterality Date  . OVARIAN CYST SURGERY    . WISDOM TOOTH EXTRACTION        Vanessa Kick, MD 12/08/18 [phone]

## 2018-12-02 ENCOUNTER — Ambulatory Visit: Payer: 59 | Admitting: Psychology

## 2018-12-04 DIAGNOSIS — F4323 Adjustment disorder with mixed anxiety and depressed mood: Secondary | ICD-10-CM | POA: Diagnosis not present

## 2018-12-17 DIAGNOSIS — F4323 Adjustment disorder with mixed anxiety and depressed mood: Secondary | ICD-10-CM | POA: Diagnosis not present

## 2018-12-25 MED FILL — FLUoxetine HCL 10 MG CAPS: 10 | 30 days supply | Qty: 60 | Fill #2

## 2018-12-25 MED FILL — PNV-SELECT TABLET: 27-0.6-0.4 | 30 days supply | Qty: 30 | Fill #3

## 2018-12-31 ENCOUNTER — Other Ambulatory Visit: Payer: Self-pay

## 2018-12-31 DIAGNOSIS — F32A Depression, unspecified: Secondary | ICD-10-CM

## 2018-12-31 DIAGNOSIS — F329 Major depressive disorder, single episode, unspecified: Secondary | ICD-10-CM

## 2018-12-31 DIAGNOSIS — O99342 Other mental disorders complicating pregnancy, second trimester: Secondary | ICD-10-CM

## 2018-12-31 MED ORDER — FLUOXETINE HCL 20 MG PO CAPS: ORAL_CAPSULE | ORAL | 1 refills | Status: DC

## 2018-12-31 MED ORDER — FLUOXETINE HCL 10 MG PO CAPS
ORAL_CAPSULE | ORAL | 1 refills | Status: DC
Start: 2018-12-31 — End: 2019-06-07

## 2018-12-31 MED FILL — FLUoxetine HCL 20 MG CAPS: 20 | 90 days supply | Qty: 180 | Fill #0

## 2019-02-09 ENCOUNTER — Other Ambulatory Visit: Payer: Self-pay | Admitting: Family Medicine

## 2019-02-09 MED FILL — busPIRone HCL 7.5 MG TABS: 7.5 | 30 days supply | Qty: 90 | Fill #0

## 2019-02-09 MED FILL — PNV-SELECT TABLET: 27-0.6-0.4 | 30 days supply | Qty: 30 | Fill #0

## 2019-02-09 NOTE — Telephone Encounter (Signed)
TA-I went to approve this medication to be refilled and it looked funky/can you hit approve and see what you think? Plz advise/thx dmf

## 2019-03-29 MED FILL — FLUoxetine HCL 20 MG CAPS: 20 | 90 days supply | Qty: 180 | Fill #0

## 2019-03-29 MED FILL — PNV-SELECT TABLET: 27-0.6-0.4 | 30 days supply | Qty: 30 | Fill #1

## 2019-03-29 MED FILL — FLUoxetine HCL 10 MG CAPS: 10 | 90 days supply | Qty: 180 | Fill #0

## 2019-04-29 ENCOUNTER — Telehealth: Payer: Self-pay | Admitting: Family Medicine

## 2019-04-29 NOTE — Telephone Encounter (Signed)
Called pt about message she sent in:  Appointment Request From: [name]    With Provider: Arnette Norris, MD [[facility]]    Preferred Date Range: Any    Preferred Times: Monday Afternoon, Tuesday Afternoon, Wednesday Afternoon, Thursday Afternoon, Friday Afternoon    Reason for visit: Office Visit    Comments:  Discuss resuming Adderall   Voicemail was full, she also has lab work do that was canceled for multiple times back in January

## 2019-05-04 ENCOUNTER — Telehealth (INDEPENDENT_AMBULATORY_CARE_PROVIDER_SITE_OTHER): Payer: 59 | Admitting: Family Medicine

## 2019-05-04 ENCOUNTER — Telehealth: Payer: Self-pay | Admitting: Family Medicine

## 2019-05-04 DIAGNOSIS — G479 Sleep disorder, unspecified: Secondary | ICD-10-CM | POA: Diagnosis not present

## 2019-05-04 MED ORDER — AMPHETAMINE-DEXTROAMPHETAMINE 5 MG PO TABS: 5.0000 mg | ORAL_TABLET | Freq: Every day | ORAL | 0 refills | Status: DC

## 2019-05-04 MED ORDER — AMPHETAMINE-DEXTROAMPHETAMINE 5 MG PO TABS: ORAL_TABLET | ORAL | 0 refills | Status: DC

## 2019-05-04 MED FILL — DEXTROAMP-AMPHETAMINE 5 MG: 5 | 30 days supply | Qty: 30 | Fill #0

## 2019-05-04 NOTE — Assessment & Plan Note (Signed)
>  15 minutes spent in face to face time with patient, >50% spent in counselling or coordination of care. Will restart adderall at low dose- 5 mg daily.  PDMP reviewed- no red flags. Will have her complete a UDS/CSC if this is long term rx. She will follow up with me in 2 weeks and will see if sleeping has helped her anxiety symptoms.  If not, we will revisit prozac dose. She is attending psychotherapy.

## 2019-05-04 NOTE — Progress Notes (Signed)
Virtual Visit via Video   Due to the COVID-19 pandemic, this visit was completed with telemedicine (audio/video) technology to reduce patient and provider exposure as well as to preserve personal protective equipment.   I connected with Tracy Gallegos by a video enabled telemedicine application and verified that I am speaking with the correct person using two identifiers. Location patient: Home Location provider:  [facility], [facility] Persons participating in the virtual visit: Tracy Gallegos, Arnette Norris, MD   I discussed the limitations of evaluation and management by telemedicine and the availability of in person appointments. The patient expressed understanding and agreed to proceed.  Care Team   Patient Care Team: Lucille Passy, MD as PCP - General (Family Medicine)  Subjective:   HPI:   Sleep disorder- multiple sleep studies done which showed that she does not go into deep REM sleep.  Was followed by different sleep doctors and placed on rxs, including seroquel and nuvigil,(both made her feel awful).  She was taking low dose adderall (not currently because she is breast feeding), which seemed to be working well.  PDMP reviewed- no red flag symptoms.  Does not have a UDS or CSC because she hasn't been on it.  She would like to restart it since she is no longer breast feeding anymore.  Unfortunately due to all the stress with her sick son, [name], she and her husband are currently separated.  She is not sure if the prozac is working as well but she suspects it's situational.    Review of Systems  Constitutional: Negative.   Psychiatric/Behavioral: Negative for depression, hallucinations, memory loss, substance abuse and suicidal ideas. The patient is nervous/anxious and has insomnia.   All other systems reviewed and are negative.    Patient Active Problem List   Diagnosis Date Noted  . Depression with anxiety 06/30/2018  . History of gestational diabetes  06/30/2018  . Sleep disorder, unspecified 06/30/2018  . Gestational diabetes mellitus 03/30/2018  . 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 Smoking status: Never Smoker  . Smokeless tobacco: Never Used  Substance Use Topics  . Alcohol use: No    Current Outpatient Medications:  .  amphetamine-dextroamphetamine (ADDERALL) 5 MG tablet, dextroamphetamine-amphetamine 5 mg tablet, Disp: 30 tablet, Rfl: 0 .  busPIRone (BUSPAR) 7.5 MG tablet, Take 1 tablet (7.5 mg total) by mouth 3 (three) times daily., Disp: 90 tablet, Rfl: 1 .  FLUoxetine (PROZAC) 10 MG capsule, Take 1-2 caps daily as directed, to be taken along with two 20 mg capsules daily, Disp: 180 capsule, Rfl: 1 .  FLUoxetine (PROZAC) 20 MG capsule, Take 2qd (along with 10mg  Fluoxetine), Disp: 180 capsule, Rfl: 1 .  Prenatal Vit w/Fe-Methylfol-FA (PNV-SELECT) 27-0.6-0.4 MG TABS, TAKE 1 TABLET BY MOUTH DAILY., Disp: 30 tablet, Rfl: 3 .  Prenatal Vit-Fe Fumarate-FA (PRENATAL MULTIVITAMIN) TABS tablet, Take 1 tablet by mouth daily at 12 noon., Disp: , Rfl:   Allergies  Allergen Reactions  . Nitrous Oxide Nausea And Vomiting    Patient would like to try during labor. States it has been 20 years since she experienced this.    Objective:   VITALS: Per patient if applicable, see vitals. GENERAL: Alert, appears well and in no acute distress. HEENT: Atraumatic, conjunctiva clear, no obvious abnormalities on inspection of external nose and ears. NECK: Normal movements of the head and neck. CARDIOPULMONARY: No increased WOB. Speaking in clear  sentences. I:E ratio WNL.  MS: Moves all visible extremities without noticeable abnormality. PSYCH: Pleasant and cooperative, well-groomed. Speech normal rate and rhythm. Affect is appropriate. Insight and judgement are appropriate. Attention is focused, linear, and appropriate.  NEURO: CN grossly intact. Oriented  as arrived to appointment on time with no prompting. Moves both UE equally.  SKIN: No obvious lesions, wounds, erythema, or cyanosis noted on face or hands.  Depression screen [facility] 2/9 08/26/2018 06/30/2018 06/30/2018  Decreased Interest 2 1 1   Down, Depressed, Hopeless 1 1 1   PHQ - 2 Score 3 2 2   Altered sleeping 1 1 -  Tired, decreased energy 3 3 -  Change in appetite 0 0 -  Feeling bad or failure about yourself  1 0 -  Trouble concentrating 0 3 -  Moving slowly or fidgety/restless 0 0 -  Suicidal thoughts 0 0 -  PHQ-9 Score 8 9 -  Difficult doing work/chores Somewhat difficult - -    Assessment and Plan:   Mylani was seen today for follow-up.  Diagnoses and all orders for this visit:  Sleep disorder, unspecified  Other orders -     amphetamine-dextroamphetamine (ADDERALL) 5 MG tablet; dextroamphetamine-amphetamine 5 mg tablet    . COVID-19 Education: The signs and symptoms of COVID-19 were discussed with the patient and how to seek care for testing if needed. The importance of social distancing was discussed today. . Reviewed expectations re: course of current medical issues. . Discussed self-management of symptoms. . Outlined signs and symptoms indicating need for more acute intervention. . Patient verbalized understanding and all questions were answered. Marland Kitchen Health Maintenance issues including appropriate healthy diet, exercise, and smoking avoidance were discussed with patient. . See orders for this visit as documented in the electronic medical record.  Arnette Norris, MD  Records requested if needed. Time spent: 15 minutes, of which >50% was spent in obtaining information about her symptoms, reviewing her previous labs, evaluations, and treatments, counseling her about her condition (please see the discussed topics above), and developing a plan to further investigate it; she had a number of questions which I addressed.

## 2019-05-04 NOTE — Telephone Encounter (Signed)
New eRx sent.  I apologize.

## 2019-05-04 NOTE — Telephone Encounter (Signed)
Medication Refill - Medication: amphetamine-dextroamphetamine (ADDERALL) 5 MG tablet    Has the patient contacted their pharmacy? Yes.   (Agent: If no, request that the patient contact the pharmacy for the refill.) (Agent: If yes, when and what did the pharmacy advise?)  Preferred Pharmacy (with phone number or street name): [facility]  [phone] Script does not have any directions on it. please call back with directions   Agent: Please be advised that RX refills may take up to 3 business days. We ask that you follow-up with your pharmacy.

## 2019-05-04 NOTE — Telephone Encounter (Signed)
Dr. Deborra Medina - please advise.  Pharmacy is saying the Rx does not have any directions. Is it just 1 tab daily?

## 2019-05-12 ENCOUNTER — Ambulatory Visit: Payer: Self-pay | Admitting: Family Medicine

## 2019-05-31 MED FILL — PNV-SELECT TABLET: 27-0.6-0.4 | 30 days supply | Qty: 30 | Fill #0

## 2019-06-02 ENCOUNTER — Encounter: Payer: Self-pay | Admitting: Family Medicine

## 2019-06-03 ENCOUNTER — Other Ambulatory Visit: Payer: Self-pay

## 2019-06-03 ENCOUNTER — Telehealth: Payer: 59 | Admitting: Family Medicine

## 2019-06-07 ENCOUNTER — Encounter: Payer: Self-pay | Admitting: Family Medicine

## 2019-06-07 ENCOUNTER — Telehealth (INDEPENDENT_AMBULATORY_CARE_PROVIDER_SITE_OTHER): Payer: 59 | Admitting: Family Medicine

## 2019-06-07 DIAGNOSIS — Z8632 Personal history of gestational diabetes: Secondary | ICD-10-CM

## 2019-06-07 DIAGNOSIS — F418 Other specified anxiety disorders: Secondary | ICD-10-CM

## 2019-06-07 DIAGNOSIS — G479 Sleep disorder, unspecified: Secondary | ICD-10-CM | POA: Diagnosis not present

## 2019-06-07 DIAGNOSIS — R5383 Other fatigue: Secondary | ICD-10-CM | POA: Diagnosis not present

## 2019-06-07 DIAGNOSIS — Z862 Personal history of diseases of the blood and blood-forming organs and certain disorders involving the immune mechanism: Secondary | ICD-10-CM | POA: Diagnosis not present

## 2019-06-07 MED ORDER — AMPHETAMINE-DEXTROAMPHETAMINE 10 MG PO TABS: 10.0000 mg | ORAL_TABLET | Freq: Every day | ORAL | 0 refills | Status: DC

## 2019-06-07 MED FILL — AMPHETAMINE-DEXTROAMPHETAMI: 10 | 90 days supply | Qty: 90 | Fill #0

## 2019-06-07 NOTE — Progress Notes (Signed)
Virtual Visit via Video   Due to the COVID-19 pandemic, this visit was completed with telemedicine (audio/video) technology to reduce patient and provider exposure as well as to preserve personal protective equipment.   I connected with Tracy Gallegos by a video enabled telemedicine application and verified that I am speaking with the correct person using two identifiers. Location patient: Home Location provider: [facility], Office Persons participating in the virtual visit: Tracy Gallegos, Arnette Norris, MD   I discussed the limitations of evaluation and management by telemedicine and the availability of in person appointments. The patient expressed understanding and agreed to proceed.  Care Team   Patient Care Team: Lucille Passy, MD as PCP - General (Family Medicine)  Subjective:   HPI:   She is needing to follow up with her medications:  Sleep disorder-multiple sleep studies done which showed that she does not go into deep REM sleep. Was followed by different sleep doctors and placed on rxs, including seroquel and nuvigil,(both made her feel awful). She was taking low dose adderall.  She was really hoping that 5mg  would be sufficient, however, after a few weeks of taking the 5mg  she was still so exhausted" an wants to try to increase it back to her previous breast feeding dose of 10 mg daily. Per [place] PMP pt compliant without red flags.   She is also requesting A1C & Fe labs to possibly rule out any organic reason for her exhaustion.   Depression screen is high but is situational given her son, [name]'s medical problems but she and her husband and doing better as a couple..  Review of Systems  Constitutional: Positive for malaise/fatigue.  HENT: Negative.   Eyes: Negative.   Respiratory: Negative.   Cardiovascular: Negative.   Gastrointestinal: Negative.   Genitourinary: Negative.   Musculoskeletal: Negative.   Skin: Negative.   Neurological: Negative.    Endo/Heme/Allergies: Negative.   Psychiatric/Behavioral: Positive for depression. Negative for hallucinations, memory loss, substance abuse and suicidal ideas. The patient is nervous/anxious and has insomnia.   All other systems reviewed and are negative.    Patient Active Problem List   Diagnosis Date Noted  . Depression with anxiety 06/30/2018  . History of gestational diabetes 06/30/2018  . Sleep disorder, unspecified 06/30/2018  . Gestational diabetes mellitus 03/30/2018  . 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 Anti-D antibodies present during pregnancy 2017/10/06    Social History   Tobacco Use  . Smoking status: Never Smoker  . Smokeless tobacco: Never Used  Substance Use Topics  . Alcohol use: No    Current Outpatient Medications:  .  amphetamine-dextroamphetamine (ADDERALL) 5 MG tablet, Take 1 tablet (5 mg total) by mouth daily. dextroamphetamine-amphetamine 5 mg tablet, Disp: 30 tablet, Rfl: 0 .  busPIRone (BUSPAR) 7.5 MG tablet, Take 1 tablet (7.5 mg total) by mouth 3 (three) times daily., Disp: 90 tablet, Rfl: 1 .  FLUoxetine (PROZAC) 20 MG capsule, Take 2qd (along with 10mg  Fluoxetine), Disp: 180 capsule, Rfl: 1 .  Prenatal Vit-Fe Fumarate-FA (PRENATAL MULTIVITAMIN) TABS tablet, Take 1 tablet by mouth daily at 12 noon., Disp: , Rfl:   Allergies  Allergen Reactions  . Nitrous Oxide Nausea And Vomiting    Patient would like to try during labor. States it has been 20 years since she experienced this.    Objective:   LMP 05/25/2019   Breastfeeding No   GENERAL: Alert, appears well and in no acute distress. HEENT: Atraumatic, conjunctiva clear, no obvious abnormalities on  inspection of external nose and ears. NECK: Normal movements of the head and neck. CARDIOPULMONARY: No increased WOB. Speaking in clear sentences. I:E ratio WNL.  MS: Moves all visible extremities without noticeable abnormality. PSYCH: Pleasant and cooperative, well-groomed. Speech normal rate  and rhythm. Affect is appropriate. Insight and judgement are appropriate. Attention is focused, linear, and appropriate.  NEURO: CN grossly intact. Oriented as arrived to appointment on time with no prompting. Moves both UE equally.  SKIN: No obvious lesions, wounds, erythema, or cyanosis noted on face or hands.  Depression screen [facility] 2/9 06/07/2019 08/26/2018 06/30/2018  Decreased Interest 2 2 1   Down, Depressed, Hopeless 2 1 1   PHQ - 2 Score 4 3 2   Altered sleeping 1 1 1   Tired, decreased energy 3 3 3   Change in appetite 0 0 0  Feeling bad or failure about yourself  2 1 0  Trouble concentrating 3 0 3  Moving slowly or fidgety/restless 0 0 0  Suicidal thoughts 0 0 0  PHQ-9 Score 13 8 9   Difficult doing work/chores Extremely dIfficult Somewhat difficult -    Assessment and Plan:   Stephana was seen today for follow-up.  Diagnoses and all orders for this visit:  Depression with anxiety  H/O: iron deficiency anemia  Personal history of gestational diabetes    . COVID-19 Education: The signs and symptoms of COVID-19 were discussed with the patient and how to seek care for testing if needed. The importance of social distancing was discussed today. . Reviewed expectations re: course of current medical issues. . Discussed self-management of symptoms. . Outlined signs and symptoms indicating need for more acute intervention. . Patient verbalized understanding and all questions were answered. Marland Kitchen Health Maintenance issues including appropriate healthy diet, exercise, and smoking avoidance were discussed with patient. . See orders for this visit as documented in the electronic medical record.  Arnette Norris, MD  Records requested if needed. Time spent: 25 minutes, of which >50% was spent in obtaining information about her symptoms, reviewing her previous labs, evaluations, and treatments, counseling her about her condition (please see the discussed topics above), and developing a plan to  further investigate it; she had a number of questions which I addressed.

## 2019-06-07 NOTE — Assessment & Plan Note (Signed)
>  25 minutes spent in face to face time with patient, >50% spent in counselling or coordination of care discussing excessive sleepiness and sleep disorder.  Very reasonable to increase adderall to pre breast feeding dose- eRx sent for 10 mg daily dose.  She will update me in a few weeks.  Given h/o gestational diabetes and iron deficiency, I would like to rule out other labs and she agrees with that as well.  Orders Placed This Encounter  Procedures  . CBC w/Diff  . HgB A1c  . TSH  . VITAMIN D 25 Hydroxy (Vit-D Deficiency, Fractures)  . Ferritin

## 2019-06-08 ENCOUNTER — Other Ambulatory Visit: Payer: Self-pay

## 2019-06-09 ENCOUNTER — Other Ambulatory Visit (INDEPENDENT_AMBULATORY_CARE_PROVIDER_SITE_OTHER): Payer: 59

## 2019-06-09 DIAGNOSIS — F418 Other specified anxiety disorders: Secondary | ICD-10-CM | POA: Diagnosis not present

## 2019-06-09 DIAGNOSIS — Z8632 Personal history of gestational diabetes: Secondary | ICD-10-CM

## 2019-06-09 DIAGNOSIS — Z862 Personal history of diseases of the blood and blood-forming organs and certain disorders involving the immune mechanism: Secondary | ICD-10-CM

## 2019-06-09 DIAGNOSIS — R5383 Other fatigue: Secondary | ICD-10-CM | POA: Diagnosis not present

## 2019-06-09 LAB — CBC WITH DIFFERENTIAL/PLATELET
Basophils Absolute: 0 10*3/uL (ref 0.0–0.1)
Basophils Relative: 0.5 % (ref 0.0–3.0)
Eosinophils Absolute: 0.1 10*3/uL (ref 0.0–0.7)
Eosinophils Relative: 0.8 % (ref 0.0–5.0)
HCT: 38.2 % (ref 36.0–46.0)
Hemoglobin: 13 g/dL (ref 12.0–15.0)
Lymphocytes Relative: 31.6 % (ref 12.0–46.0)
Lymphs Abs: 2.2 10*3/uL (ref 0.7–4.0)
MCHC: 33.9 g/dL (ref 30.0–36.0)
MCV: 92.3 fl (ref 78.0–100.0)
Monocytes Absolute: 0.4 10*3/uL (ref 0.1–1.0)
Monocytes Relative: 5.4 % (ref 3.0–12.0)
Neutro Abs: 4.3 10*3/uL (ref 1.4–7.7)
Neutrophils Relative %: 61.7 % (ref 43.0–77.0)
Platelets: 204 10*3/uL (ref 150.0–400.0)
RBC: 4.14 Mil/uL (ref 3.87–5.11)
RDW: 12.7 % (ref 11.5–15.5)
WBC: 6.9 10*3/uL (ref 4.0–10.5)

## 2019-06-09 LAB — TSH: TSH: 1.99 u[IU]/mL (ref 0.35–4.50)

## 2019-06-09 LAB — VITAMIN D 25 HYDROXY (VIT D DEFICIENCY, FRACTURES): VITD: 27.41 ng/mL — ABNORMAL LOW (ref 30.00–100.00)

## 2019-06-09 LAB — FERRITIN: Ferritin: 24 ng/mL (ref 10.0–291.0)

## 2019-06-09 LAB — HEMOGLOBIN A1C: Hgb A1c MFr Bld: 5.2 % (ref 4.6–6.5)

## 2019-06-09 NOTE — Addendum Note (Signed)
Addended by: Lynnea Ferrier on: 06/09/2019 07:32 AM   Modules accepted: Orders

## 2019-06-11 ENCOUNTER — Telehealth: Payer: 59 | Admitting: Nurse Practitioner

## 2019-06-11 DIAGNOSIS — L247 Irritant contact dermatitis due to plants, except food: Secondary | ICD-10-CM

## 2019-06-11 MED ORDER — DESOXIMETASONE 0.25 % EX CREA: 1.0000 "application " | TOPICAL_CREAM | Freq: Two times a day (BID) | CUTANEOUS | 0 refills | Status: DC

## 2019-06-11 MED ORDER — PREDNISONE 10 MG (21) PO TBPK: ORAL_TABLET | ORAL | 0 refills | Status: DC

## 2019-06-11 NOTE — Progress Notes (Signed)
E Visit for Rash  We are sorry that you are not feeling well. Here is how we plan to help!  Based on what you shared with me it looks like you have contact dermatitis.  Contact dermatitis is a skin rash caused by something that touches the skin and causes irritation or inflammation.  Your skin may be red, swollen, dry, cracked, and itch.  The rash should go away in a few days but can last a few weeks.  If you get a rash, it's important to figure out what caused it so the irritant can be avoided in the future.    Prednisone 10 mg daily for 6 days (see taper instructions below) and topicort cream to apply BID   HOME CARE:   Take cool showers and avoid direct sunlight.  Apply cool compress or wet dressings.  Take a bath in an oatmeal bath.  Sprinkle content of one Aveeno packet under running faucet with comfortably warm water.  Bathe for 15-20 minutes, 1-2 times daily.  Pat dry with a towel. Do not rub the rash.  Use hydrocortisone cream.  Take an antihistamine like Benadryl for widespread rashes that itch.  The adult dose of Benadryl is 25-50 mg by mouth 4 times daily.  Caution:  This type of medication may cause sleepiness.  Do not drink alcohol, drive, or operate dangerous machinery while taking antihistamines.  Do not take these medications if you have prostate enlargement.  Read package instructions thoroughly on all medications that you take.  GET HELP RIGHT AWAY IF:   Symptoms don't go away after treatment.  Severe itching that persists.  If you rash spreads or swells.  If you rash begins to smell.  If it blisters and opens or develops a yellow-brown crust.  You develop a fever.  You have a sore throat.  You become short of breath.  MAKE SURE YOU:  Understand these instructions. Will watch your condition. Will get help right away if you are not doing well or get worse.  Thank you for choosing an e-visit. Your e-visit answers were reviewed by a board certified  advanced clinical practitioner to complete your personal care plan. Depending upon the condition, your plan could have included both over the counter or prescription medications. Please review your pharmacy choice. Be sure that the pharmacy you have chosen is open so that you can pick up your prescription now.  If there is a problem you may message your provider in [place] to have the prescription routed to another pharmacy. Your safety is important to [place]. If you have drug allergies check your prescription carefully.  For the next 24 hours, you can use MyChart to ask questions about today's visit, request a non-urgent call back, or ask for a work or school excuse from your e-visit provider. You will get an email in the next two days asking about your experience. I hope that your e-visit has been valuable and will speed your recovery.   5-10 minutes spent reviewing and documenting in chart.

## 2019-06-17 DIAGNOSIS — Z3689 Encounter for other specified antenatal screening: Secondary | ICD-10-CM | POA: Diagnosis not present

## 2019-06-17 DIAGNOSIS — Z3200 Encounter for pregnancy test, result unknown: Secondary | ICD-10-CM | POA: Diagnosis not present

## 2019-06-17 DIAGNOSIS — Z8759 Personal history of other complications of pregnancy, childbirth and the puerperium: Secondary | ICD-10-CM | POA: Diagnosis not present

## 2019-06-22 DIAGNOSIS — Z3201 Encounter for pregnancy test, result positive: Secondary | ICD-10-CM | POA: Diagnosis not present

## 2019-07-12 MED FILL — PNV-SELECT TABLET: 27-0.6-0.4 | 30 days supply | Qty: 30 | Fill #1

## 2019-07-21 DIAGNOSIS — Z3A11 11 weeks gestation of pregnancy: Secondary | ICD-10-CM | POA: Diagnosis not present

## 2019-07-21 DIAGNOSIS — O09299 Supervision of pregnancy with other poor reproductive or obstetric history, unspecified trimester: Secondary | ICD-10-CM | POA: Diagnosis not present

## 2019-07-21 DIAGNOSIS — Z3689 Encounter for other specified antenatal screening: Secondary | ICD-10-CM | POA: Diagnosis not present

## 2019-07-21 DIAGNOSIS — O09291 Supervision of pregnancy with other poor reproductive or obstetric history, first trimester: Secondary | ICD-10-CM | POA: Diagnosis not present

## 2019-07-21 DIAGNOSIS — Z118 Encounter for screening for other infectious and parasitic diseases: Secondary | ICD-10-CM | POA: Diagnosis not present

## 2019-07-21 LAB — OB RESULTS CONSOLE HEPATITIS B SURFACE ANTIGEN: Hepatitis B Surface Ag: NEGATIVE

## 2019-07-21 LAB — OB RESULTS CONSOLE HIV ANTIBODY (ROUTINE TESTING): HIV: NONREACTIVE

## 2019-07-21 LAB — OB RESULTS CONSOLE RPR: RPR: NONREACTIVE

## 2019-07-21 LAB — OB RESULTS CONSOLE ABO/RH: RH Type: NEGATIVE

## 2019-07-21 LAB — OB RESULTS CONSOLE RUBELLA ANTIBODY, IGM: Rubella: IMMUNE

## 2019-07-21 LAB — OB RESULTS CONSOLE ANTIBODY SCREEN: Antibody Screen: NEGATIVE

## 2019-07-21 LAB — OB RESULTS CONSOLE GC/CHLAMYDIA
Chlamydia: NEGATIVE
Gonorrhea: NEGATIVE

## 2019-07-21 MED FILL — PROMETHAZINE 25 MG TABLET: 25 | 4 days supply | Qty: 24 | Fill #0

## 2019-07-21 MED FILL — ONDANSETRON ODT 8 MG TABLET: 8 | 12 days supply | Qty: 24 | Fill #0

## 2019-07-22 ENCOUNTER — Other Ambulatory Visit (HOSPITAL_COMMUNITY): Payer: Self-pay | Admitting: Obstetrics

## 2019-07-22 DIAGNOSIS — Z8279 Family history of other congenital malformations, deformations and chromosomal abnormalities: Secondary | ICD-10-CM

## 2019-07-22 DIAGNOSIS — Z3A16 16 weeks gestation of pregnancy: Secondary | ICD-10-CM

## 2019-07-30 ENCOUNTER — Encounter: Payer: Self-pay | Admitting: Family Medicine

## 2019-08-04 ENCOUNTER — Encounter: Payer: Self-pay | Admitting: Family Medicine

## 2019-08-04 ENCOUNTER — Ambulatory Visit: Payer: 59 | Admitting: Family Medicine

## 2019-08-04 DIAGNOSIS — Z3A13 13 weeks gestation of pregnancy: Secondary | ICD-10-CM | POA: Diagnosis not present

## 2019-08-04 DIAGNOSIS — O09291 Supervision of pregnancy with other poor reproductive or obstetric history, first trimester: Secondary | ICD-10-CM | POA: Diagnosis not present

## 2019-08-04 DIAGNOSIS — O09299 Supervision of pregnancy with other poor reproductive or obstetric history, unspecified trimester: Secondary | ICD-10-CM | POA: Diagnosis not present

## 2019-08-11 ENCOUNTER — Ambulatory Visit: Payer: 59

## 2019-08-11 ENCOUNTER — Ambulatory Visit: Payer: 59 | Admitting: Family Medicine

## 2019-08-20 ENCOUNTER — Ambulatory Visit (HOSPITAL_COMMUNITY): Payer: 59

## 2019-08-20 ENCOUNTER — Encounter (HOSPITAL_COMMUNITY): Payer: Self-pay

## 2019-08-23 ENCOUNTER — Telehealth: Payer: Self-pay | Admitting: Family Medicine

## 2019-08-23 NOTE — Telephone Encounter (Signed)

## 2019-08-24 ENCOUNTER — Ambulatory Visit (INDEPENDENT_AMBULATORY_CARE_PROVIDER_SITE_OTHER): Payer: 59

## 2019-08-24 ENCOUNTER — Other Ambulatory Visit: Payer: Self-pay

## 2019-08-24 DIAGNOSIS — Z23 Encounter for immunization: Secondary | ICD-10-CM | POA: Diagnosis not present

## 2019-08-24 NOTE — Progress Notes (Signed)
After obtaining consent, and per orders of Dr. Deborra Medina, injection of Flu given by Everard Interrante Berneta Sages. Patient instructed to remain in clinic for 20 minutes afterwards, and to report any adverse reaction to me immediately.

## 2019-08-27 ENCOUNTER — Other Ambulatory Visit: Payer: Self-pay | Admitting: Family Medicine

## 2019-08-30 NOTE — Telephone Encounter (Signed)
Pt stated that no longer taking prenatal vit? thx dmf

## 2019-09-06 DIAGNOSIS — Z3689 Encounter for other specified antenatal screening: Secondary | ICD-10-CM | POA: Diagnosis not present

## 2019-09-06 DIAGNOSIS — Z361 Encounter for antenatal screening for raised alphafetoprotein level: Secondary | ICD-10-CM | POA: Diagnosis not present

## 2019-09-06 DIAGNOSIS — Z363 Encounter for antenatal screening for malformations: Secondary | ICD-10-CM | POA: Diagnosis not present

## 2019-09-06 DIAGNOSIS — O09299 Supervision of pregnancy with other poor reproductive or obstetric history, unspecified trimester: Secondary | ICD-10-CM | POA: Diagnosis not present

## 2019-09-10 ENCOUNTER — Other Ambulatory Visit: Payer: Self-pay | Admitting: Family Medicine

## 2019-09-10 ENCOUNTER — Other Ambulatory Visit: Payer: Self-pay | Admitting: Nurse Practitioner

## 2019-09-10 DIAGNOSIS — F329 Major depressive disorder, single episode, unspecified: Secondary | ICD-10-CM

## 2019-09-10 DIAGNOSIS — O99342 Other mental disorders complicating pregnancy, second trimester: Secondary | ICD-10-CM

## 2019-09-10 DIAGNOSIS — F32A Depression, unspecified: Secondary | ICD-10-CM

## 2019-09-10 DIAGNOSIS — F418 Other specified anxiety disorders: Secondary | ICD-10-CM

## 2019-09-10 MED FILL — busPIRone HCL 7.5 MG TABS: 7.5 | 30 days supply | Qty: 90 | Fill #0

## 2019-09-10 MED FILL — FLUoxetine HCL 10 MG CAPS: 10 | 90 days supply | Qty: 180 | Fill #0

## 2019-09-13 MED FILL — FLUoxetine HCL 20 MG CAPS: 20 | 90 days supply | Qty: 180 | Fill #0

## 2019-09-16 ENCOUNTER — Other Ambulatory Visit: Payer: Self-pay

## 2019-09-16 DIAGNOSIS — Z20822 Contact with and (suspected) exposure to covid-19: Secondary | ICD-10-CM

## 2019-09-18 LAB — NOVEL CORONAVIRUS, NAA: SARS-CoV-2, NAA: NOT DETECTED

## 2019-10-15 MED FILL — FREESTYLE LANCETS: 25 days supply | Qty: 100 | Fill #0

## 2019-10-15 MED FILL — FREESTYLE LITE TEST STRIP: 25 days supply | Qty: 100 | Fill #0

## 2019-11-08 DIAGNOSIS — Z3689 Encounter for other specified antenatal screening: Secondary | ICD-10-CM | POA: Diagnosis not present

## 2019-11-12 NOTE — L&D Delivery Note (Signed)
Delivery Note At 3:02 PM a viable female was delivered via Vaginal, Spontaneous (Presentation: Direct occiput Anterior).  APGAR: 6, 9; weight  .  Pending Placenta status: Spontaneous, complete.  Given history of prior retained placenta placenta fully evaluated with no evidence of missing segments..  Cord: 3 vessels with the following complications: Nuchal cord x1, unable to be reduced but able to deliver through.  Cord pH: Not done due to spontaneous cry and good tone.  Also focus on getting adequate cord blood for chromosomal testing.  Anesthesia: Epidural Episiotomy: None Lacerations: 1st degree Suture Repair: Single figure-of-eight suture of 2-0 Vicryl Rapide Est. Blood Loss (mL): Per nursing notes, my estimate 150 cc  Mom to postpartum.  Baby to In room currently being evaluated by NICU for low oxygen saturation.  Ala Dach 01/28/2020, 3:29 PM

## 2019-11-14 ENCOUNTER — Encounter: Payer: Self-pay | Admitting: Family Medicine

## 2019-11-15 MED FILL — FREESTYLE LITE TEST STRIP: 25 days supply | Qty: 100 | Fill #1

## 2019-11-15 MED FILL — PNV-DHA + DOCUSATE SOFTGEL: 27-1.25-300 | 30 days supply | Qty: 30 | Fill #0

## 2019-11-23 DIAGNOSIS — Z3A29 29 weeks gestation of pregnancy: Secondary | ICD-10-CM | POA: Diagnosis not present

## 2019-11-23 DIAGNOSIS — Z23 Encounter for immunization: Secondary | ICD-10-CM | POA: Diagnosis not present

## 2019-11-23 DIAGNOSIS — O360130 Maternal care for anti-D [Rh] antibodies, third trimester, not applicable or unspecified: Secondary | ICD-10-CM | POA: Diagnosis not present

## 2019-11-23 DIAGNOSIS — O09293 Supervision of pregnancy with other poor reproductive or obstetric history, third trimester: Secondary | ICD-10-CM | POA: Diagnosis not present

## 2019-11-23 DIAGNOSIS — Z3689 Encounter for other specified antenatal screening: Secondary | ICD-10-CM | POA: Diagnosis not present

## 2019-11-23 MED FILL — metFORMIN HCL 500 MG TABS: 500 | 30 days supply | Qty: 60 | Fill #0

## 2019-11-28 NOTE — Progress Notes (Signed)
Virtual Visit via Video   Due to the COVID-19 pandemic, this visit was completed with telemedicine (audio/video) technology to reduce patient and provider exposure as well as to preserve personal protective equipment.   I connected with Tracy Gallegos by a video enabled telemedicine application and verified that I am speaking with the correct person using two identifiers. Location patient: Home Location provider: [facility], Office Persons participating in the virtual visit: Tracy Gallegos, Tracy Norris, MD   I discussed the limitations of evaluation and management by telemedicine and the availability of in person appointments. The patient expressed understanding and agreed to proceed.  Care Team   Patient Care Team: Tracy Passy, MD as PCP - General (Family Medicine)  Subjective:   HPI: Patient agrees to virtual visit.  She is wanting to discuss medication adjustment.  Would like to discuss medication adjustment.   Pt sent the following message:  "Hello Dr. Deborra Gallegos,   These past few weeks and months have been very rough on me, and on our family, both physically and emotionally. Would you mind putting in a referral for me to see a trusted and established female psychiatrist who is willing to work with a pregnant woman? Thank you.   Tracy Gallegos"    Review of Systems  Constitutional: Negative for fever and malaise/fatigue.  HENT: Negative for congestion and hearing loss.   Eyes: Negative for blurred vision, discharge and redness.  Respiratory: Negative for cough and shortness of breath.   Cardiovascular: Negative for chest pain, palpitations and leg swelling.  Gastrointestinal: Negative for abdominal pain and heartburn.  Genitourinary: Negative for dysuria.  Musculoskeletal: Negative for falls.  Skin: Negative for rash.  Neurological: Negative for loss of consciousness and headaches.  Endo/Heme/Allergies: Does not bruise/bleed easily.    Psychiatric/Behavioral: Positive for depression. Negative for hallucinations, substance abuse and suicidal ideas. The patient is nervous/anxious. The patient does not have insomnia.   All other systems reviewed and are negative.    Patient Active Problem List   Diagnosis Date Noted  . Fatigue 06/07/2019  . Depression with anxiety 06/30/2018  . History of gestational diabetes 06/30/2018  . Sleep disorder, unspecified 06/30/2018  . Gestational diabetes mellitus 03/30/2018  . 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 Smoking status: Never Smoker  . Smokeless tobacco: Never Used  Substance Use Topics  . Alcohol use: No    Current Outpatient Medications:  .  amphetamine-dextroamphetamine (ADDERALL) 10 MG tablet, Take 1 tablet (10 mg total) by mouth daily with breakfast., Disp: 90 tablet, Rfl: 0 .  amphetamine-dextroamphetamine (ADDERALL) 5 MG tablet, Take 1 tablet (5 mg total) by mouth daily. dextroamphetamine-amphetamine 5 mg tablet, Disp: 30 tablet, Rfl: 0 .  buPROPion (WELLBUTRIN) 75 MG tablet, Take 1 tablet (75 mg total) by mouth 2 (two) times daily., Disp: 180 tablet, Rfl: 3 .  busPIRone (BUSPAR) 7.5 MG tablet, Take 1 tablet (7.5 mg total) by mouth 3 (three) times daily. Needs appt with pcp for additional refills, Disp: 90 tablet, Rfl: 0 .  desoximetasone (TOPICORT) 0.25 % cream, Apply 1 application topically 2 (two) times daily., Disp: 30 g, Rfl: 0 .  FLUoxetine (PROZAC) 20 MG capsule, TAKE 2 CAPSULES BY MOUTH ONCE A DAY (ALONG WITH 10MG STRENGTH), Disp: 180 capsule, Rfl: 3 .  predniSONE (STERAPRED UNI-PAK 21 TAB) 10 MG (21) TBPK tablet, As directed x 6 days, Disp: 21 tablet, Rfl: 0 .  Prenatal Vit-Fe Fumarate-FA (PRENATAL MULTIVITAMIN) TABS tablet, Take 1 tablet by mouth daily at 12 noon., Disp: , Rfl:   Allergies  Allergen Reactions  . Nitrous Oxide Nausea And Vomiting    Patient would like  to try during labor. States it has been 20 years since she experienced this.    Objective:  There were no vitals taken for this visit.  VITALS: Per patient if applicable, see vitals. GENERAL: Alert, appears well and in no acute distress. HEENT: Atraumatic, conjunctiva clear, no obvious abnormalities on inspection of external nose and ears. NECK: Normal movements of the head and neck. CARDIOPULMONARY: No increased WOB. Speaking in clear sentences. I:E ratio WNL.  MS: Moves all visible extremities without noticeable abnormality. PSYCH: Pleasant and cooperative, well-groomed. Speech normal rate and rhythm. Affect is appropriate. Insight and judgement are appropriate. Attention is focused, linear, and appropriate.  NEURO: CN grossly intact. Oriented as arrived to appointment on time with no prompting. Moves both UE equally.  SKIN: No obvious lesions, wounds, erythema, or cyanosis noted on face or hands.  Depression screen [facility] 2/9 06/07/2019 08/26/2018 06/30/2018  Decreased Interest _0 Down, Depressed, Hopeless _1 PHQ - 2 Score _2 Altered sleeping _3 Tired, decreased energy _4 Change in appetite 0 0 0  Feeling bad or failure about yourself  2 1 0  Trouble concentrating 3 0 3  Moving slowly or fidgety/restless 0 0 0  Suicidal thoughts 0 0 0  PHQ-9 Score _5 Difficult doing work/chores Extremely dIfficult Somewhat difficult -     . COVID-19 Education: The signs and symptoms of COVID-19 were discussed with the patient and how to seek care for testing if needed. The importance of social distancing was discussed today. . Reviewed expectations re: course of current medical issues. . Discussed self-management of symptoms. . Outlined signs and symptoms indicating need for more acute intervention. . Patient verbalized understanding and all questions were answered. Marland Kitchen Health Maintenance issues including appropriate healthy diet, exercise, and smoking avoidance were  discussed with patient. . See orders for this visit as documented in the electronic medical record.  Tracy Norris, MD  Records requested if needed. Time spent: 25 minutes, of which >50% was spent in obtaining information about her symptoms, reviewing her previous labs, evaluations, and treatments, counseling her about her condition (please see the discussed topics above), and developing a plan to further investigate it; she had a number of questions which I addressed.   Lab Results  Component Value Date   WBC 6.9 06/09/2019   HGB 13.0 06/09/2019   HCT 38.2 06/09/2019   PLT 204.0 06/09/2019   TSH 1.99 06/09/2019   HGBA1C 5.2 06/09/2019    Lab Results  Component Value Date   TSH 1.99 06/09/2019   Lab Results  Component Value Date   WBC 6.9 06/09/2019   HGB 13.0 06/09/2019   HCT 38.2 06/09/2019   MCV 92.3 06/09/2019   PLT 204.0 06/09/2019   No results found for: NA, K, CHLORIDE, CO2, GLUCOSE, BUN, CREATININE, BILITOT, ALKPHOS, AST, ALT, PROT, ALBUMIN, CALCIUM, ANIONGAP, EGFR, GFR No results found for: CHOL No results found for: HDL No results found for: LDLCALC No results found for: TRIG No results found for: [facility] Lab Results  Component Value Date   HGBA1C 5.2 06/09/2019       Assessment & Plan:   Problem List Items Addressed This Visit  Active Problems   Anti-D antibodies present during pregnancy   Depression with anxiety - Primary    She decided that she would prefer not to see a psychiatrist right now.  Wanted to discuss adding welbutrin to prozac 30 mg daily but wanted my opinion since she is pregnant.  She is due in march.  She and her husband (who is a physician) felt wellbutrin may be a good adjunctive rx and I agree that could be beneficial as she has difficulty concentrating and sleeping without her adderrall.  She wants to try wellbutrin sr 75 mg twice daily.  Call or send my chart message prn if these symptoms worsen or fail to improve as  anticipated. The patient indicates understanding of these issues and agrees with the plan.   Depression screen [facility] 2/9 06/07/2019 08/26/2018 06/30/2018 06/30/2018 10/08/2017  Decreased Interest _0 0  Down, Depressed, Hopeless _1 0  PHQ - 2 Score _2 0  Altered sleeping _3 - -  Tired, decreased energy _4 - -  Change in appetite 0 0 0 - -  Feeling bad or failure about yourself  2 1 0 - -  Trouble concentrating 3 0 3 - -  Moving slowly or fidgety/restless 0 0 0 - -  Suicidal thoughts 0 0 0 - -  PHQ-9 Score _5 - -  Difficult doing work/chores Extremely dIfficult Somewhat difficult - - -   GAD 7 : Generalized Anxiety Score 06/07/2019 08/26/2018 06/30/2018  Nervous, Anxious, on Edge _6 Control/stop worrying _7 Worry too much - different things _8 Trouble relaxing 0 1 2  Restless 0 0 0  Easily annoyed or irritable _9 Afraid - awful might happen 1 1 0  Total GAD 7 Score _10 Anxiety Difficulty Very difficult Very difficult -           Relevant Medications   buPROPion (WELLBUTRIN) 75 MG tablet      I am having Lashona A. Glazer start on buPROPion. I am also having her maintain her prenatal multivitamin, amphetamine-dextroamphetamine, amphetamine-dextroamphetamine, predniSONE, desoximetasone, FLUoxetine, and busPIRone.  Meds ordered this encounter  Medications  . buPROPion (WELLBUTRIN) 75 MG tablet    Sig: Take 1 tablet (75 mg total) by mouth 2 (two) times daily.    Dispense:  180 tablet    Refill:  3     Tracy Norris, MD

## 2019-11-30 ENCOUNTER — Telehealth (INDEPENDENT_AMBULATORY_CARE_PROVIDER_SITE_OTHER): Payer: 59 | Admitting: Family Medicine

## 2019-11-30 ENCOUNTER — Other Ambulatory Visit: Payer: Self-pay

## 2019-11-30 DIAGNOSIS — F418 Other specified anxiety disorders: Secondary | ICD-10-CM | POA: Diagnosis not present

## 2019-11-30 DIAGNOSIS — O360130 Maternal care for anti-D [Rh] antibodies, third trimester, not applicable or unspecified: Secondary | ICD-10-CM

## 2019-11-30 MED ORDER — BUPROPION HCL 75 MG PO TABS: 75.0000 mg | ORAL_TABLET | Freq: Two times a day (BID) | ORAL | 3 refills | Status: DC

## 2019-11-30 MED FILL — buPROPion HCL 75 MG TABS: 75 | 90 days supply | Qty: 180 | Fill #0

## 2019-11-30 NOTE — Assessment & Plan Note (Addendum)
She decided that she would prefer not to see a psychiatrist right now.  Wanted to discuss adding welbutrin to prozac 30 mg daily but wanted my opinion since she is pregnant.  She is due in march.  She and her husband (who is a physician) felt wellbutrin may be a good adjunctive rx and I agree that could be beneficial as she has difficulty concentrating and sleeping without her adderrall.  She wants to try wellbutrin sr 75 mg twice daily.  Call or send my chart message prn if these symptoms worsen or fail to improve as anticipated. The patient indicates understanding of these issues and agrees with the plan.   Depression screen [facility] 2/9 06/07/2019 08/26/2018 06/30/2018 06/30/2018 10/08/2017  Decreased Interest 2 2 1 1  0  Down, Depressed, Hopeless 2 1 1 1  0  PHQ - 2 Score 4 3 2 2  0  Altered sleeping 1 1 1  - -  Tired, decreased energy 3 3 3  - -  Change in appetite 0 0 0 - -  Feeling bad or failure about yourself  2 1 0 - -  Trouble concentrating 3 0 3 - -  Moving slowly or fidgety/restless 0 0 0 - -  Suicidal thoughts 0 0 0 - -  PHQ-9 Score 13 8 9  - -  Difficult doing work/chores Extremely dIfficult Somewhat difficult - - -   GAD 7 : Generalized Anxiety Score 06/07/2019 08/26/2018 06/30/2018  Nervous, Anxious, on Edge 3 2 1   Control/stop worrying 2 2 1   Worry too much - different things 2 2 1   Trouble relaxing 0 1 2  Restless 0 0 0  Easily annoyed or irritable 2 3 2   Afraid - awful might happen 1 1 0  Total GAD 7 Score 10 11 7   Anxiety Difficulty Very difficult Very difficult -

## 2019-12-07 ENCOUNTER — Encounter: Payer: Self-pay | Admitting: Family Medicine

## 2019-12-07 MED FILL — FREESTYLE LITE TEST STRIP: 25 days supply | Qty: 100 | Fill #2

## 2019-12-17 MED FILL — PNV-DHA + DOCUSATE SOFTGEL: 27-1.25-300 | 30 days supply | Qty: 30 | Fill #1

## 2019-12-21 DIAGNOSIS — R8290 Unspecified abnormal findings in urine: Secondary | ICD-10-CM | POA: Diagnosis not present

## 2019-12-24 MED FILL — metFORMIN HCL 500 MG TABS: 500 | 30 days supply | Qty: 60 | Fill #1

## 2020-01-04 DIAGNOSIS — Z3685 Encounter for antenatal screening for Streptococcus B: Secondary | ICD-10-CM | POA: Diagnosis not present

## 2020-01-04 DIAGNOSIS — Z3A35 35 weeks gestation of pregnancy: Secondary | ICD-10-CM | POA: Diagnosis not present

## 2020-01-04 DIAGNOSIS — O24419 Gestational diabetes mellitus in pregnancy, unspecified control: Secondary | ICD-10-CM | POA: Diagnosis not present

## 2020-01-04 LAB — OB RESULTS CONSOLE GBS: GBS: NEGATIVE

## 2020-01-04 MED FILL — MetFORMIN HCL 850 MG TAB: 850 | 30 days supply | Qty: 30 | Fill #0

## 2020-01-05 ENCOUNTER — Telehealth: Payer: Self-pay | Admitting: General Practice

## 2020-01-05 MED FILL — FREESTYLE LITE TEST STRIP: 25 days supply | Qty: 100 | Fill #3

## 2020-01-05 NOTE — Telephone Encounter (Signed)
Patient is calling requesting a TOC fom Dr. Deborra Medina to Dr. Bryan Lemma. Pls advise. CB is [phone]

## 2020-01-05 NOTE — Telephone Encounter (Signed)
Ok with me 

## 2020-01-05 NOTE — Telephone Encounter (Signed)
Okay for TOC? Please advise

## 2020-01-06 NOTE — Telephone Encounter (Signed)
Called to see if patient could schedule annual exam, unable to leave message. Left message vis Mychart for pt to give [place] a call to schedule.

## 2020-01-10 MED FILL — BUPROPION HCL SR 150 MG TAB: 150 | 30 days supply | Qty: 60 | Fill #0

## 2020-01-13 DIAGNOSIS — Z3A37 37 weeks gestation of pregnancy: Secondary | ICD-10-CM | POA: Diagnosis not present

## 2020-01-13 DIAGNOSIS — O24419 Gestational diabetes mellitus in pregnancy, unspecified control: Secondary | ICD-10-CM | POA: Diagnosis not present

## 2020-01-17 MED FILL — MetFORMIN HCL 850 MG TAB: 850 | 30 days supply | Qty: 60 | Fill #0

## 2020-01-19 IMAGING — DX DG CHEST 2V
2 series · 2 of 2 positions shown · non-contrast
Comparison: None.

CLINICAL DATA: Pneumonia. Cough for 3 months. Nonsmoker. Pregnant.

EXAM:
CHEST  2 VIEW

[chest pa]
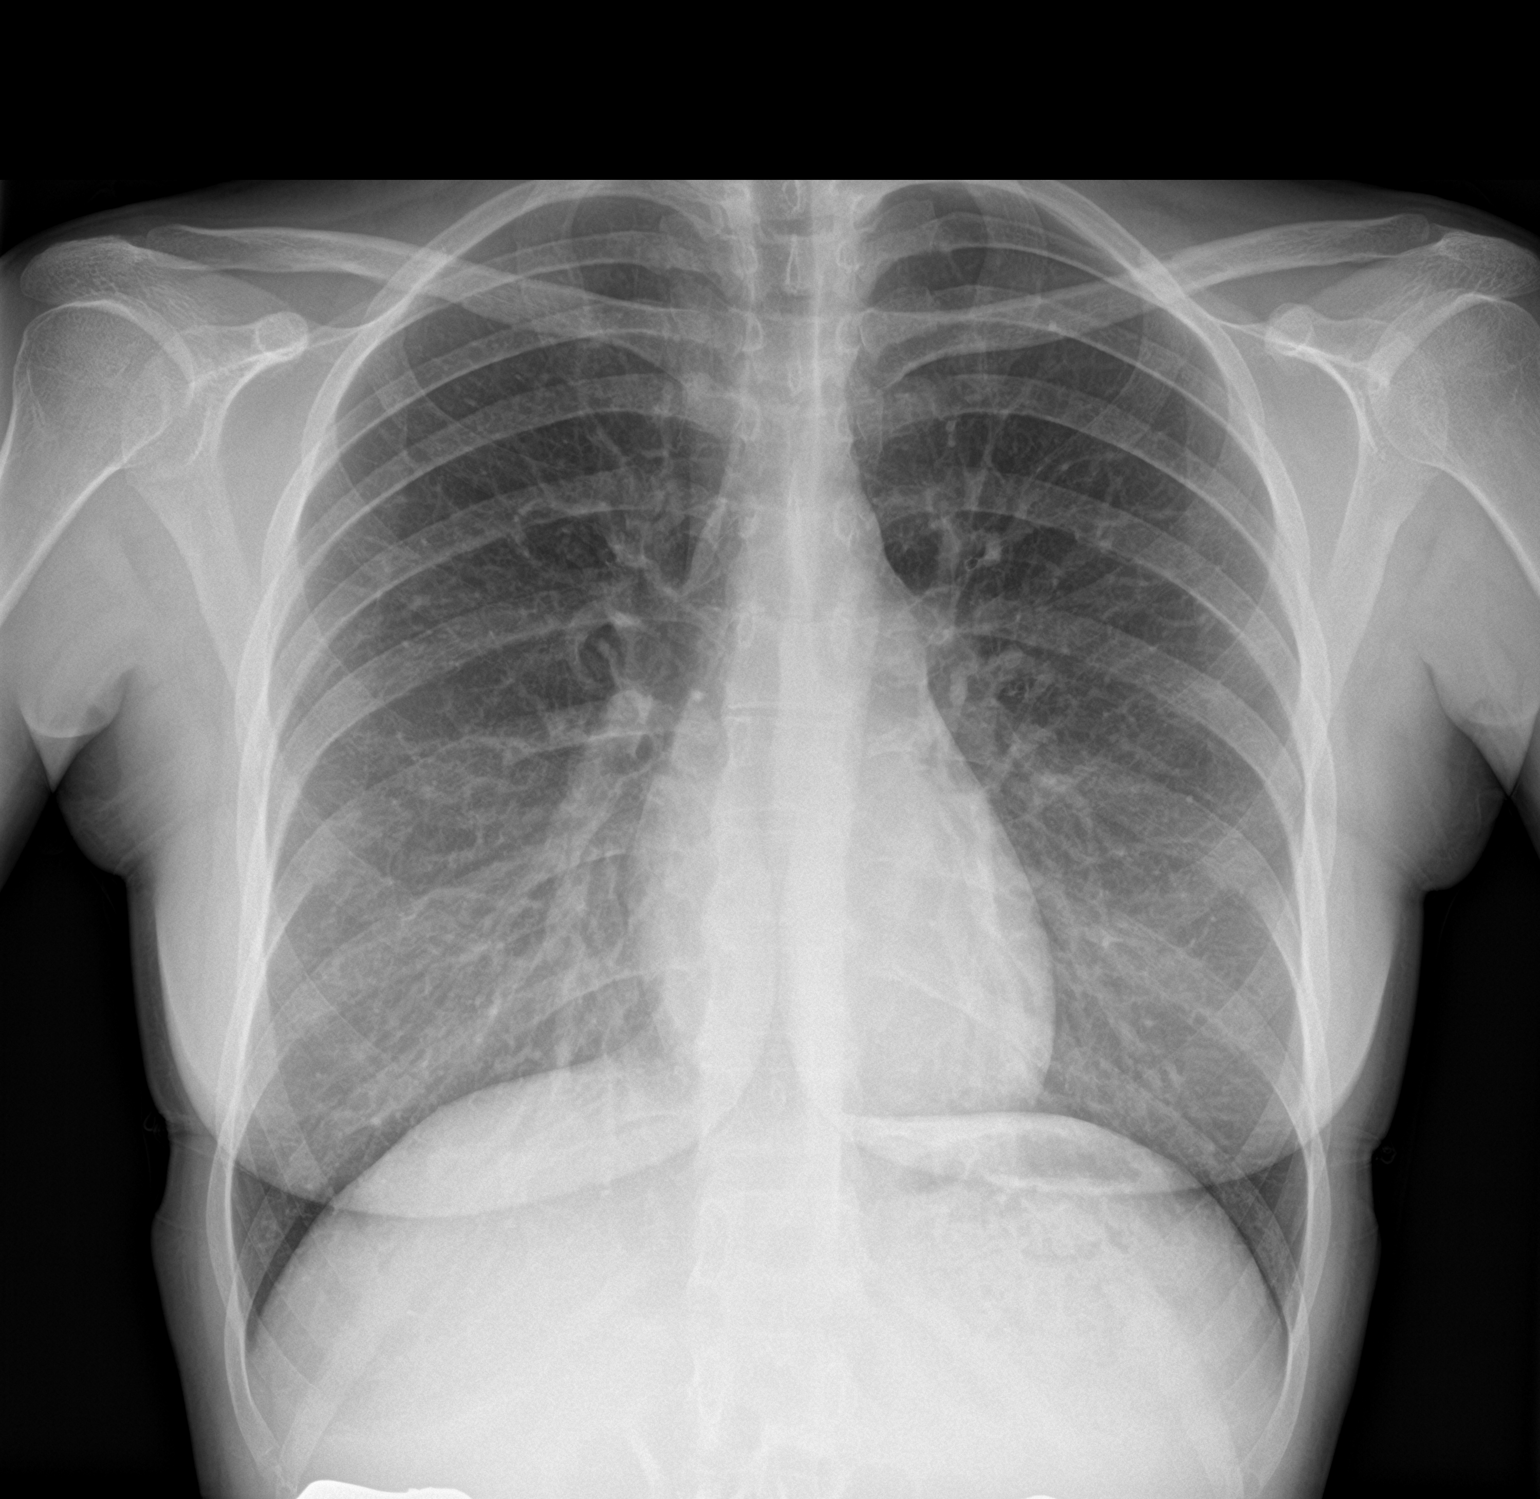

[chest lat]
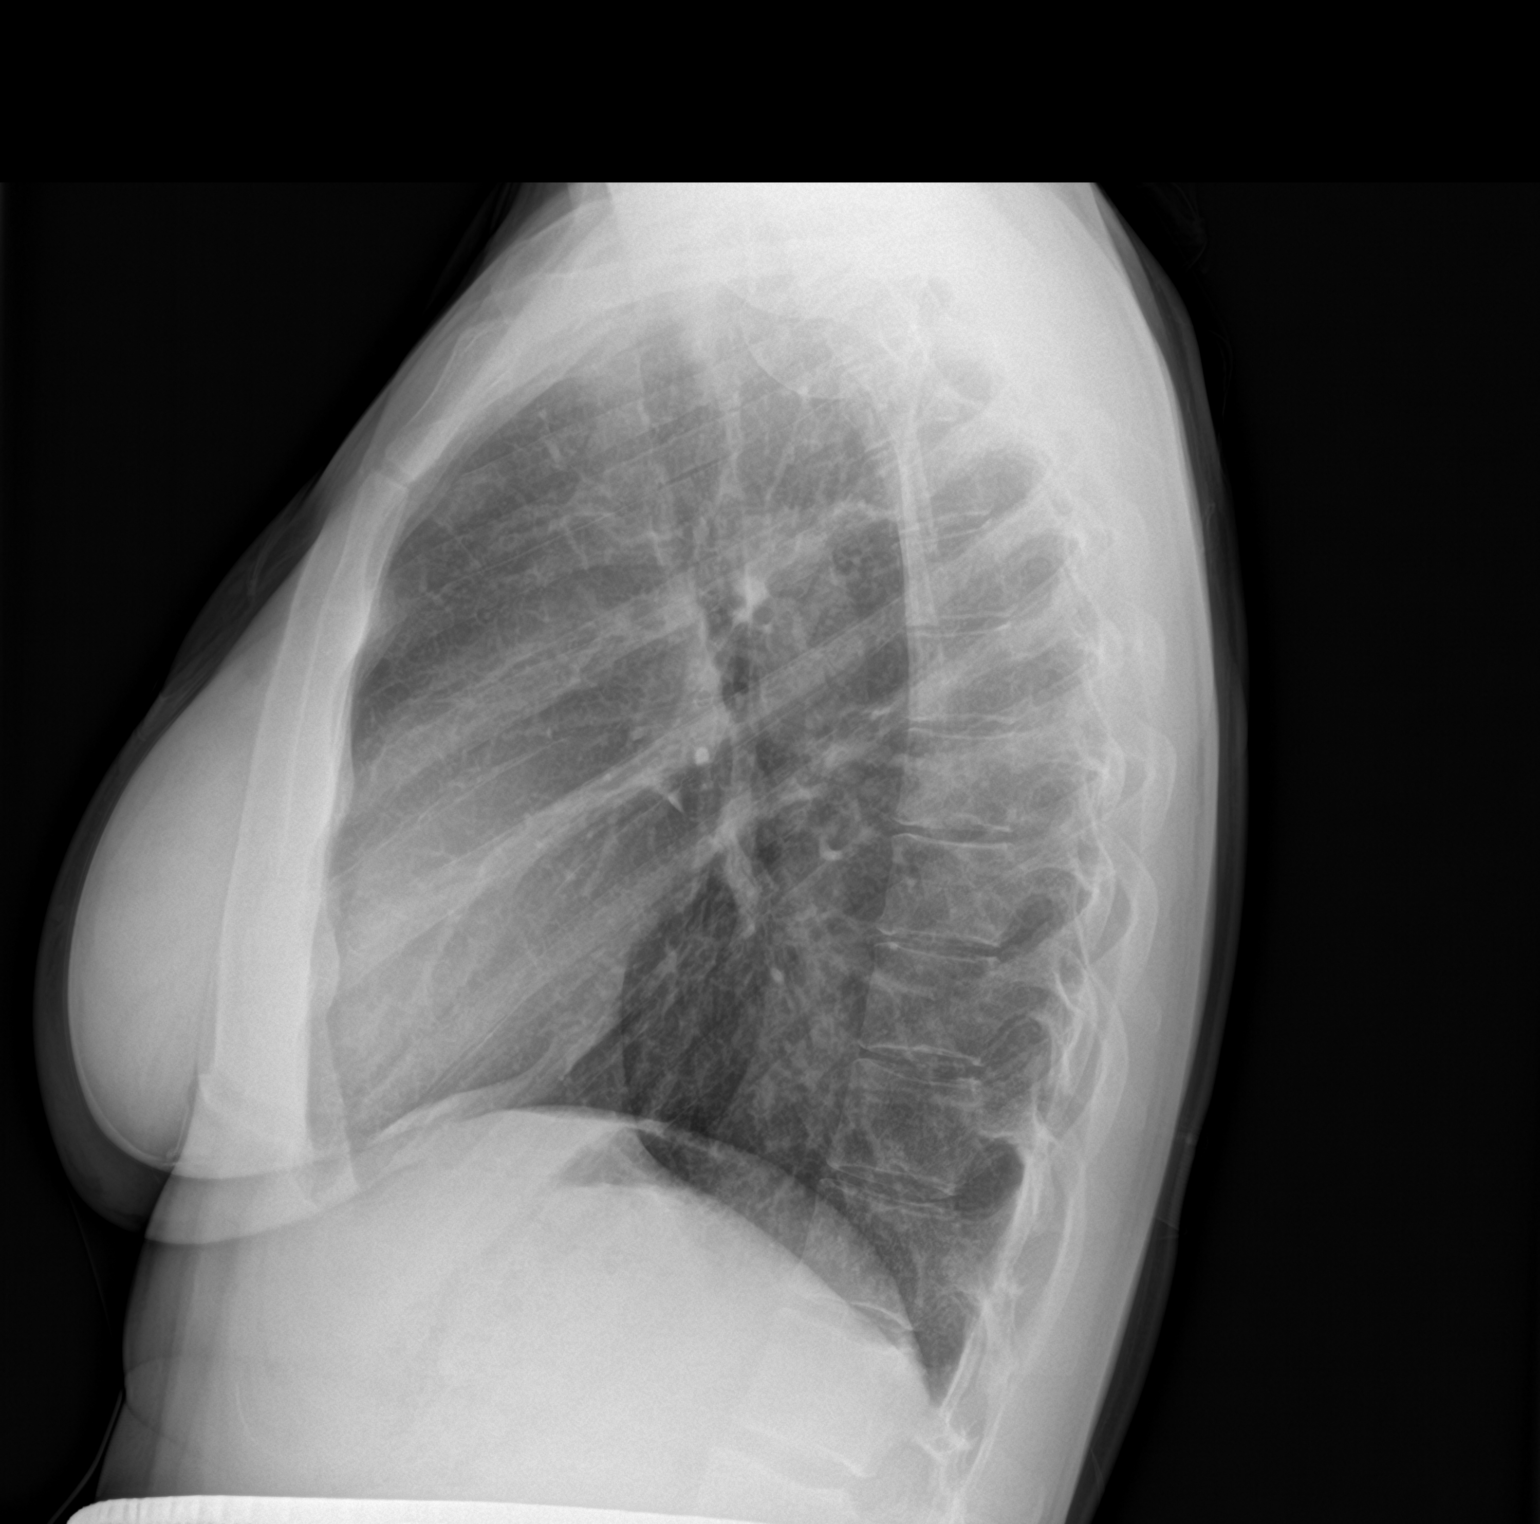

[2 of 2 positions shown; findings below may reference images not displayed]

FINDINGS: Midline trachea.  Normal heart size and mediastinal contours.

Sharp costophrenic angles.  No pneumothorax.  Clear lungs.
IMPRESSION: No active cardiopulmonary disease.

## 2020-01-20 DIAGNOSIS — O24419 Gestational diabetes mellitus in pregnancy, unspecified control: Secondary | ICD-10-CM | POA: Diagnosis not present

## 2020-01-20 DIAGNOSIS — Z3A38 38 weeks gestation of pregnancy: Secondary | ICD-10-CM | POA: Diagnosis not present

## 2020-01-24 ENCOUNTER — Encounter (HOSPITAL_COMMUNITY): Payer: Self-pay | Admitting: *Deleted

## 2020-01-24 ENCOUNTER — Telehealth (HOSPITAL_COMMUNITY): Payer: Self-pay | Admitting: *Deleted

## 2020-01-24 NOTE — Telephone Encounter (Signed)
Preadmission screen  

## 2020-01-26 ENCOUNTER — Other Ambulatory Visit: Payer: Self-pay | Admitting: Obstetrics

## 2020-01-26 ENCOUNTER — Other Ambulatory Visit (HOSPITAL_COMMUNITY)
Admission: RE | Admit: 2020-01-26 | Discharge: 2020-01-26 | Disposition: A | Discharge status: Home / Self Care | Payer: 59 | Source: Ambulatory Visit | Attending: Obstetrics | Admitting: Obstetrics

## 2020-01-26 DIAGNOSIS — F419 Anxiety disorder, unspecified: Secondary | ICD-10-CM | POA: Diagnosis not present

## 2020-01-26 DIAGNOSIS — O24419 Gestational diabetes mellitus in pregnancy, unspecified control: Secondary | ICD-10-CM | POA: Diagnosis not present

## 2020-01-26 DIAGNOSIS — O99344 Other mental disorders complicating childbirth: Secondary | ICD-10-CM | POA: Diagnosis not present

## 2020-01-26 DIAGNOSIS — O24425 Gestational diabetes mellitus in childbirth, controlled by oral hypoglycemic drugs: Secondary | ICD-10-CM | POA: Diagnosis not present

## 2020-01-26 DIAGNOSIS — Z20822 Contact with and (suspected) exposure to covid-19: Secondary | ICD-10-CM | POA: Diagnosis not present

## 2020-01-26 DIAGNOSIS — Z3A39 39 weeks gestation of pregnancy: Secondary | ICD-10-CM | POA: Diagnosis not present

## 2020-01-26 LAB — SARS CORONAVIRUS 2 (TAT 6-24 HRS): SARS Coronavirus 2: NEGATIVE

## 2020-01-27 DIAGNOSIS — Z3A39 39 weeks gestation of pregnancy: Secondary | ICD-10-CM | POA: Diagnosis not present

## 2020-01-27 DIAGNOSIS — O24419 Gestational diabetes mellitus in pregnancy, unspecified control: Secondary | ICD-10-CM | POA: Diagnosis not present

## 2020-01-27 NOTE — H&P (Signed)
Tracy Gallegos is a 34 y.o. [id number] at [redacted]w[redacted]d presenting for IOL for A2GDM. Pt notes rare contractions. Good fetal movement, No vaginal bleeding, not leaking fluid.  Admitted this am, pitocin started, AROM after epidural then contractions have slowed.   [facility] at [place] since 7 wks - Dated by 7 wk u/s - unplanned preg, desired after initial ambivalence. Marital stress and considering separation prior to learning of pregnancy. - Anxiety with depression. Stopped adderall with learning of pregnancy - GDM, h/o GDM in prior preg, passed early DS. 3rd trimester screening 160, adequate BS control on metfromin 500bid to 850 bid. 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 Prior MRI did not show sphincter damage. Pt continent of stool, aware further trauma could lead to incontinence but pt opts for trial of labor over PCS. - h/o retained placenta, needing removal 1 wk PP as G1 - h/o mentum anterior delivery. Pt very concerned about recurrence of this. 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 Pt declined prenatal testing as would not plan TOP. Has genetics to follow post-natally if needed. Planning to send cord blood in a 5%sodium heparin tube to [facility]. Peds planning to order.     Prenatal Transfer Tool  Maternal Diabetes: Yes:  Diabetes Type:  Insulin/Medication controlled Genetic Screening: Normal Maternal Ultrasounds/Referrals: Normal Fetal Ultrasounds or other Referrals:  None Maternal Substance Abuse:  No Significant Maternal Medications:  None Significant Maternal Lab Results: Group B Strep negative     OB History    Gravida  3   Para  2   Term  2   Preterm      AB      Living  2     SAB      TAB      Ectopic      Multiple  0   Live Births  2          Past Medical History:   Diagnosis Date  . Depression   . Endometriosis   . Gestational diabetes    metformin  . History of gestational diabetes 01/23/2018  . Ovarian cyst    Bilateral   Past Surgical History:  Procedure Laterality Date  . OVARIAN CYST SURGERY    . WISDOM TOOTH EXTRACTION     Family History: family history includes Alcohol abuse in her maternal grandmother, maternal uncle, and mother; Alzheimer's disease in her maternal grandmother; Depression in her maternal grandfather, maternal grandmother, maternal uncle, mother, and paternal grandmother; Early death in her father and paternal grandfather; Heart disease in her father, maternal grandfather, and paternal grandfather; Hypertension in her maternal grandmother; Obesity in her brother and mother; Stroke in her maternal grandfather; Suicidality in her maternal grandfather and paternal grandmother. Social History:  reports that she has never smoked. She has never used smokeless tobacco. She reports that she does not drink alcohol or use drugs.  Review of Systems - Negative except anxiety over delivery  PE: Vitals:   01/28/20 1203 01/28/20 1231 01/28/20 1309 01/28/20 1330  BP: 113/72 (!) 107/54 (!) 101/54   Pulse: 65 67 65   Resp:  18 18   Temp:    99 F (37.2 C)  TempSrc:    Oral  Weight:      Height:       Gen: lying on  side, no distress Abd: soft, gravid, NT Skin: warm, dry LE: NT, no edema GU: 6/100/vtx 0 Toco: q 8 FH: reactive, no decels  Prenatal labs: ABO, Rh: O/Negative/-- (09/09 0000) Antibody: Negative (09/09 0000) Rubella: Immune (09/09 0000) RPR: Nonreactive (09/09 0000)  HBsAg: Negative (09/09 0000)  HIV: Non-reactive (09/09 0000)  GBS: Negative/-- (02/23 0000)  1 hr Glucola 160  Genetic screening normal Panorama Anatomy US normal   Assessment/Plan: 34 y.o. G3P2002 at [redacted]w[redacted]d - IOL for GDM, well controlled. Check BS in active labor. IOL, pitocin/ AROM when able. Consider epidural prior to AROM as h/o rapid labor in  last pregnancy. H/o mentum anterior, cont cervical checks for fetal position.  - h/o 4th degree laceration, perineal support with delivery.    Ala Dach 01/28/2020 1:38 PM

## 2020-01-28 ENCOUNTER — Inpatient Hospital Stay (HOSPITAL_COMMUNITY)
Admission: RE | Admit: 2020-01-28 | Discharge: 2020-01-30 | DRG: 807 | Disposition: A | Discharge status: Home / Self Care | Payer: 59 | Attending: Obstetrics | Admitting: Obstetrics

## 2020-01-28 ENCOUNTER — Inpatient Hospital Stay (HOSPITAL_COMMUNITY): Payer: 59 | Admitting: Anesthesiology

## 2020-01-28 ENCOUNTER — Other Ambulatory Visit: Payer: Self-pay

## 2020-01-28 ENCOUNTER — Encounter (HOSPITAL_COMMUNITY): Payer: Self-pay | Admitting: Obstetrics

## 2020-01-28 ENCOUNTER — Inpatient Hospital Stay (HOSPITAL_COMMUNITY): Payer: 59

## 2020-01-28 DIAGNOSIS — F419 Anxiety disorder, unspecified: Secondary | ICD-10-CM | POA: Diagnosis present

## 2020-01-28 DIAGNOSIS — Z20822 Contact with and (suspected) exposure to covid-19: Secondary | ICD-10-CM | POA: Diagnosis present

## 2020-01-28 DIAGNOSIS — O24425 Gestational diabetes mellitus in childbirth, controlled by oral hypoglycemic drugs: Secondary | ICD-10-CM | POA: Diagnosis not present

## 2020-01-28 DIAGNOSIS — O24429 Gestational diabetes mellitus in childbirth, unspecified control: Secondary | ICD-10-CM | POA: Diagnosis not present

## 2020-01-28 DIAGNOSIS — Z3A39 39 weeks gestation of pregnancy: Secondary | ICD-10-CM

## 2020-01-28 DIAGNOSIS — O99344 Other mental disorders complicating childbirth: Secondary | ICD-10-CM | POA: Diagnosis present

## 2020-01-28 DIAGNOSIS — Z3490 Encounter for supervision of normal pregnancy, unspecified, unspecified trimester: Secondary | ICD-10-CM | POA: Diagnosis present

## 2020-01-28 LAB — TYPE AND SCREEN
ABO/RH(D): O NEG
Antibody Screen: POSITIVE

## 2020-01-28 LAB — CBC
HCT: 43.2 % (ref 36.0–46.0)
Hemoglobin: 14.6 g/dL (ref 12.0–15.0)
MCH: 32 pg (ref 26.0–34.0)
MCHC: 33.8 g/dL (ref 30.0–36.0)
MCV: 94.7 fL (ref 80.0–100.0)
Platelets: 198 10*3/uL (ref 150–400)
RBC: 4.56 MIL/uL (ref 3.87–5.11)
RDW: 12.5 % (ref 11.5–15.5)
WBC: 10.8 10*3/uL — ABNORMAL HIGH (ref 4.0–10.5)
nRBC: 0 % (ref 0.0–0.2)

## 2020-01-28 LAB — GLUCOSE, CAPILLARY: Glucose-Capillary: 78 mg/dL (ref 70–99)

## 2020-01-28 LAB — RPR: RPR Ser Ql: NONREACTIVE

## 2020-01-28 MED ORDER — DIPHENHYDRAMINE HCL 25 MG PO CAPS: 25.0000 mg | ORAL_CAPSULE | Freq: Four times a day (QID) | ORAL | Status: DC | PRN

## 2020-01-28 MED ORDER — DIBUCAINE (PERIANAL) 1 % EX OINT: 1.0000 "application " | TOPICAL_OINTMENT | CUTANEOUS | Status: DC | PRN

## 2020-01-28 MED ORDER — TETANUS-DIPHTH-ACELL PERTUSSIS 5-2.5-18.5 LF-MCG/0.5 IM SUSP: 0.5000 mL | Freq: Once | INTRAMUSCULAR | Status: DC

## 2020-01-28 MED ORDER — DIPHENHYDRAMINE HCL 50 MG/ML IJ SOLN: 12.5000 mg | INTRAMUSCULAR | Status: DC | PRN

## 2020-01-28 MED ORDER — FENTANYL-BUPIVACAINE-NACL 0.5-0.125-0.9 MG/250ML-% EP SOLN
12.0000 mL/h | EPIDURAL | Status: DC | PRN
  Filled 2020-01-28: qty 250

## 2020-01-28 MED ORDER — ONDANSETRON HCL 4 MG PO TABS: 4.0000 mg | ORAL_TABLET | ORAL | Status: DC | PRN

## 2020-01-28 MED ORDER — LIDOCAINE HCL (PF) 1 % IJ SOLN: 30.0000 mL | INTRAMUSCULAR | Status: DC | PRN

## 2020-01-28 MED ORDER — SENNOSIDES-DOCUSATE SODIUM 8.6-50 MG PO TABS
2.0000 | ORAL_TABLET | ORAL | Status: DC
  Administered 2020-01-29: 2 via ORAL
  Filled 2020-01-28: qty 2

## 2020-01-28 MED ORDER — ZOLPIDEM TARTRATE 5 MG PO TABS: 5.0000 mg | ORAL_TABLET | Freq: Every evening | ORAL | Status: DC | PRN

## 2020-01-28 MED ORDER — FLUOXETINE HCL 20 MG PO CAPS
40.0000 mg | ORAL_CAPSULE | Freq: Every day | ORAL | Status: DC
  Administered 2020-01-29 – 2020-01-30 (×2): 40 mg via ORAL
  Filled 2020-01-28 (×2): qty 2

## 2020-01-28 MED ORDER — IBUPROFEN 600 MG PO TABS
600.0000 mg | ORAL_TABLET | Freq: Four times a day (QID) | ORAL | Status: DC
  Administered 2020-01-28 – 2020-01-30 (×9): 600 mg via ORAL
  Filled 2020-01-28 (×9): qty 1

## 2020-01-28 MED ORDER — OXYCODONE HCL 5 MG PO TABS: 5.0000 mg | ORAL_TABLET | ORAL | Status: DC | PRN

## 2020-01-28 MED ORDER — LACTATED RINGERS IV SOLN: INTRAVENOUS | Status: DC

## 2020-01-28 MED ORDER — OXYTOCIN 40 UNITS IN NORMAL SALINE INFUSION - SIMPLE MED: 2.5000 [IU]/h | INTRAVENOUS | Status: DC

## 2020-01-28 MED ORDER — TERBUTALINE SULFATE 1 MG/ML IJ SOLN: 0.2500 mg | Freq: Once | INTRAMUSCULAR | Status: DC | PRN

## 2020-01-28 MED ORDER — SOD CITRATE-CITRIC ACID 500-334 MG/5ML PO SOLN: 30.0000 mL | ORAL | Status: DC | PRN

## 2020-01-28 MED ORDER — WITCH HAZEL-GLYCERIN EX PADS
1.0000 "application " | MEDICATED_PAD | CUTANEOUS | Status: DC | PRN
  Administered 2020-01-30: 1 via TOPICAL

## 2020-01-28 MED ORDER — LACTATED RINGERS IV SOLN: 500.0000 mL | Freq: Once | INTRAVENOUS | Status: DC

## 2020-01-28 MED ORDER — ACETAMINOPHEN 325 MG PO TABS
650.0000 mg | ORAL_TABLET | ORAL | Status: DC | PRN
  Administered 2020-01-28 – 2020-01-30 (×6): 650 mg via ORAL
  Filled 2020-01-28 (×6): qty 2

## 2020-01-28 MED ORDER — OXYTOCIN 40 UNITS IN NORMAL SALINE INFUSION - SIMPLE MED
1.0000 m[IU]/min | INTRAVENOUS | Status: DC
  Administered 2020-01-28: 2 m[IU]/min via INTRAVENOUS
  Filled 2020-01-28: qty 1000

## 2020-01-28 MED ORDER — LACTATED RINGERS IV SOLN
500.0000 mL | Freq: Once | INTRAVENOUS | Status: AC
  Administered 2020-01-28: 500 mL via INTRAVENOUS

## 2020-01-28 MED ORDER — PHENYLEPHRINE 40 MCG/ML (10ML) SYRINGE FOR IV PUSH (FOR BLOOD PRESSURE SUPPORT)
80.0000 ug | PREFILLED_SYRINGE | INTRAVENOUS | Status: DC | PRN
  Filled 2020-01-28: qty 10

## 2020-01-28 MED ORDER — PRENATAL MULTIVITAMIN CH
1.0000 | ORAL_TABLET | Freq: Every day | ORAL | Status: DC
  Administered 2020-01-29 – 2020-01-30 (×2): 1 via ORAL
  Filled 2020-01-28 (×2): qty 1

## 2020-01-28 MED ORDER — BUPROPION HCL 75 MG PO TABS
75.0000 mg | ORAL_TABLET | Freq: Two times a day (BID) | ORAL | Status: DC
  Filled 2020-01-28: qty 1

## 2020-01-28 MED ORDER — EPHEDRINE 5 MG/ML INJ: 10.0000 mg | INTRAVENOUS | Status: DC | PRN

## 2020-01-28 MED ORDER — ONDANSETRON HCL 4 MG/2ML IJ SOLN: 4.0000 mg | Freq: Four times a day (QID) | INTRAMUSCULAR | Status: DC | PRN

## 2020-01-28 MED ORDER — BUSPIRONE HCL 15 MG PO TABS
7.5000 mg | ORAL_TABLET | Freq: Three times a day (TID) | ORAL | Status: DC
  Filled 2020-01-28: qty 1

## 2020-01-28 MED ORDER — PHENYLEPHRINE 40 MCG/ML (10ML) SYRINGE FOR IV PUSH (FOR BLOOD PRESSURE SUPPORT): 80.0000 ug | PREFILLED_SYRINGE | INTRAVENOUS | Status: DC | PRN

## 2020-01-28 MED ORDER — COCONUT OIL OIL
1.0000 | TOPICAL_OIL | Status: DC | PRN
  Administered 2020-01-28: 1 via TOPICAL

## 2020-01-28 MED ORDER — OXYCODONE HCL 5 MG PO TABS: 10.0000 mg | ORAL_TABLET | ORAL | Status: DC | PRN

## 2020-01-28 MED ORDER — BENZOCAINE-MENTHOL 20-0.5 % EX AERO: 1.0000 | INHALATION_SPRAY | CUTANEOUS | Status: DC | PRN

## 2020-01-28 MED ORDER — LACTATED RINGERS IV SOLN: 500.0000 mL | INTRAVENOUS | Status: DC | PRN

## 2020-01-28 MED ORDER — ACETAMINOPHEN 325 MG PO TABS: 650.0000 mg | ORAL_TABLET | ORAL | Status: DC | PRN

## 2020-01-28 MED ORDER — SIMETHICONE 80 MG PO CHEW: 80.0000 mg | CHEWABLE_TABLET | ORAL | Status: DC | PRN

## 2020-01-28 MED ORDER — LIDOCAINE HCL (PF) 1 % IJ SOLN
INTRAMUSCULAR | Status: DC | PRN
  Administered 2020-01-28: 3 mL via EPIDURAL
  Administered 2020-01-28: 2 mL via EPIDURAL
  Administered 2020-01-28: 5 mL via EPIDURAL

## 2020-01-28 MED ORDER — OXYTOCIN BOLUS FROM INFUSION
500.0000 mL | Freq: Once | INTRAVENOUS | Status: AC
  Administered 2020-01-28: 500 mL via INTRAVENOUS

## 2020-01-28 MED ORDER — SODIUM CHLORIDE (PF) 0.9 % IJ SOLN
INTRAMUSCULAR | Status: DC | PRN
  Administered 2020-01-28: 12 mL/h via EPIDURAL

## 2020-01-28 MED ORDER — ONDANSETRON HCL 4 MG/2ML IJ SOLN: 4.0000 mg | INTRAMUSCULAR | Status: DC | PRN

## 2020-01-28 NOTE — Anesthesia Preprocedure Evaluation (Signed)
Anesthesia Evaluation  Patient identified by MRN, date of birth, ID band Patient awake    Reviewed: Allergy & Precautions, NPO status , Patient's Chart, lab work & pertinent test results  Airway Mallampati: II  TM Distance: >3 FB Neck ROM: Full    Dental  (+) Teeth Intact, Dental Advisory Given   Pulmonary neg pulmonary ROS,    Pulmonary exam normal breath sounds clear to auscultation       Cardiovascular negative cardio ROS Normal cardiovascular exam Rhythm:Regular Rate:Normal     Neuro/Psych PSYCHIATRIC DISORDERS Anxiety Depression negative neurological ROS     GI/Hepatic negative GI ROS, Neg liver ROS,   Endo/Other  negative endocrine ROSdiabetes, Well Controlled, Gestational  Renal/GU negative Renal ROS     Musculoskeletal negative musculoskeletal ROS (+)   Abdominal   Peds  Hematology negative hematology ROS (+) Plt 198k   Anesthesia Other Findings Day of surgery medications reviewed with the patient.  Reproductive/Obstetrics (+) Pregnancy                             Anesthesia Physical Anesthesia Plan  ASA: III  Anesthesia Plan: Epidural   Post-op Pain Management:    Induction:   PONV Risk Score and Plan: 2 and Treatment may vary due to age or medical condition  Airway Management Planned: Natural Airway  Additional Equipment:   Intra-op Plan:   Post-operative Plan:   Informed Consent: I have reviewed the patients History and Physical, chart, labs and discussed the procedure including the risks, benefits and alternatives for the proposed anesthesia with the patient or authorized representative who has indicated his/her understanding and acceptance.     Dental advisory given  Plan Discussed with:   Anesthesia Plan Comments: (Patient identified. Risks/Benefits/Options discussed with patient including but not limited to bleeding, infection, nerve damage, paralysis,  failed block, incomplete pain control, headache, blood pressure changes, nausea, vomiting, reactions to medication both or allergic, itching and postpartum back pain. Confirmed with bedside nurse the patient's most recent platelet count. Confirmed with patient that they are not currently taking any anticoagulation, have any bleeding history or any family history of bleeding disorders. Patient expressed understanding and wished to proceed. All questions were answered. )        Anesthesia Quick Evaluation

## 2020-01-28 NOTE — Progress Notes (Signed)
Tracy Gallegos is a 34 y.o. G3P2002 at [redacted]w[redacted]d by 1st trim sono. IOL 39.1 wks for favorable cervix. Prior both SVDs at 37 wks.  Subjective: Epidural. Pain on right but not much, pt okay to feel some discomfort.   Objective: BP 113/72   Pulse 65   Temp 98.5 F (36.9 C) (Oral)   Resp 18   Ht 5\' 10"  (1.778 m)   Wt 68.9 kg   LMP 05/25/2019   BMI 21.81 kg/m  No intake/output data recorded. No intake/output data recorded.  FHT:  FHR: 130 bpm, variability: moderate,  accelerations:  Present,  decelerations:  Absent UC:   regular, every 3 minutes, pito at 6 mu SVE:   Dilation: 5 Effacement (%): 100 Station: -1 Exam by:: Dr. Benjie Karvonen AROM, minimal clear fluid, no mec or blood. Cx 6 cm with contraction, well applied to head. Cephalic Vertex, no face presenting (last kid face).   Labs: Lab Results  Component Value Date   WBC 10.8 (H) 01/28/2020   HGB 14.6 01/28/2020   HCT 43.2 01/28/2020   MCV 94.7 01/28/2020   PLT 198 01/28/2020    Assessment / Plan: Induction of labor due to term with favorable cervix,  progressing well on pitocin Labor: Progressing normally, I had turned pitocin off since she was having pelvic pressure with contractions and would progress fast, but have RN restart at 2 since UCs spaced out to q 5 min.  Fetal Wellbeing:  Category I Pain Control:  Epidural I/D:  n/a Anticipated MOD:  NSVD Plan to collect cord blood in 2 separate green-cap tubes for Genetic testing - FISH for chromosome deletion test- dad carrier, one child (son) affected. Dr Reitnauer/ Peds/ genetics- will be available at delivery to complete lab process to avoid any mistakes. She is available anytime, call at delivery.   Tracy Gallegos 01/28/2020, 12:34 PM

## 2020-01-28 NOTE — Lactation Note (Signed)
This note was copied from a baby's chart. Lactation Consultation Note Baby is 6 hrs old. Mom was holding baby in her lap on BF pillow leaning over side ways trying to BF baby. LC offered to assist in bring baby to mom instead of leaning over baby. Mom stated she BF better that way.  This is mom's 3rd child. Oldest 33 yr old 2nd child 22 months. Mom BF 33 yr old for 19 months and 2nd child 14 months.  Mom has everted nipples and mom hand expressing colostrum each time she tries to latch. Baby opens but will not latch. Suggested have nipple more at the roof of the mouth verses laying on the tongue, able to obtain a deeper latch that way.  Explained that newborns wants to feel breast w/cheeks and baby doesn't feel that she can get deep latch the way mom is doing. Placed pillow under mom's arm, repositioned baby on it's side towards mom and baby was able to obtain a latch and suckle. explained to mom is baby isn't close it will make her nipples very sore which I knew she was aware of.  Mom thanked [facility] but really wants to do it her way. encouraged to call for assistance if needed. Reminded of STS and I&O. Lactation brochure given.  Patient Name: Tracy Gallegos [id number] Date: 01/28/2020 Reason for consult: Initial assessment;Term   Maternal Data Has patient been taught Hand Expression?: Yes Does the patient have breastfeeding experience prior to this delivery?: Yes  Feeding Feeding Type: Breast Fed  LATCH Score Latch: Repeated attempts needed to sustain latch, nipple held in mouth throughout feeding, stimulation needed to elicit sucking reflex.  Audible Swallowing: A few with stimulation  Type of Nipple: Everted at rest and after stimulation  Comfort (Breast/Nipple): Soft / non-tender  Hold (Positioning): Assistance needed to correctly position infant at breast and maintain latch.  LATCH Score: 7  Interventions Interventions: Breast feeding basics reviewed;Support  pillows;Assisted with latch;Position options;Skin to skin;Breast massage;Hand express;Coconut oil;Breast compression;Adjust position;DEBP  Lactation Tools Discussed/Used WIC Program: No Pump Review: Setup, frequency, and cleaning Initiated by:: Virgel Bouquet, RN Date initiated:: 01/28/20   Consult Status Consult Status: Follow-up Date: 01/29/20 Follow-up type: In-patient    Theodoro Kalata 01/28/2020, 9:49 PM

## 2020-01-28 NOTE — Plan of Care (Signed)
Shital Crayton, RN 

## 2020-01-28 NOTE — Anesthesia Procedure Notes (Signed)
Epidural Patient location during procedure: OB Start time: 01/28/2020 10:34 AM End time: 01/28/2020 10:42 AM  Staffing Anesthesiologist: Catalina Gravel, MD Performed: anesthesiologist   Preanesthetic Checklist Completed: patient identified, IV checked, risks and benefits discussed, monitors and equipment checked, pre-op evaluation and timeout performed  Epidural Patient position: sitting Prep: DuraPrep Patient monitoring: blood pressure and continuous pulse ox Approach: midline Location: L3-L4 Injection technique: LOR air  Needle:  Needle type: Tuohy  Needle gauge: 17 G Needle length: 9 cm Needle insertion depth: 4 cm Catheter size: 19 Gauge Catheter at skin depth: 9 cm Test dose: negative and Other (1% Lidocaine)  Additional Notes Patient identified.  Risk benefits discussed including failed block, incomplete pain control, headache, nerve damage, paralysis, blood pressure changes, nausea, vomiting, reactions to medication both toxic or allergic, and postpartum back pain.  Patient expressed understanding and wished to proceed.  All questions were answered.  Sterile technique used throughout procedure and epidural site dressed with sterile barrier dressing. No paresthesia or other complications noted. The patient did not experience any signs of intravascular injection such as tinnitus or metallic taste in mouth nor signs of intrathecal spread such as rapid motor block. Please see nursing notes for vital signs. Reason for block:procedure for pain

## 2020-01-29 MED ORDER — RHO D IMMUNE GLOBULIN 1500 UNIT/2ML IJ SOSY
300.0000 ug | PREFILLED_SYRINGE | Freq: Once | INTRAMUSCULAR | Status: AC
  Administered 2020-01-29: 300 ug via INTRAVENOUS
  Filled 2020-01-29: qty 2

## 2020-01-29 NOTE — Progress Notes (Signed)
Pt declines fasting glucose this AM r/t eating throughout the night.  Rocky Crafts, RN 01/29/20

## 2020-01-29 NOTE — Progress Notes (Signed)
CSW received consult for hx of Depression.  CSW met with MOB to offer support and complete assessment.    CSW met with MOB at bedside to discuss mental health. FOB and infant were present at arrival, however, FOB stepped out to offer MOB privacy during assessment. Infant remained in room with MOB while breastfeeding. MOB was pleasant and engaged throughout visit.   MOB reported anxiety and depression history since age 34. 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 MOB related depressive sx to needs associated with differently abled son, and FOB not having enough time off to be home and help out. MOB reported FOB was granted 17 days off this time around so they are hopeful this express will be different. MOB confirmed active prescriptions for Prozac, Buspar, and Wellbutrin. MOB has continued Prozac however is holding off on Buspar and Wellbutrin until 2-3 weeks postpartum and breast milk more consistent. MOB reports counseling services with LCSW Mary until recent scheduling conflicts. MOB stated plans to reconnect. MOB denied any SI, HI, or domestic violence. MOB identified FOB, her mom, and in-laws as support system.    CSW invited FOB back into room to provided education regarding the baby blues period vs. perinatal mood disorders, discussed treatment and gave resources for mental health follow up if concerns arise.  CSW recommends self-evaluation during the postpartum time period using the New Mom Checklist from Postpartum Progress and encouraged MOB to contact a medical professional if symptoms are noted at any time.  MOB stated understanding and denied any questions.    CSW provided review of Sudden Infant Death Syndrome (SIDS) precautions. MOB confirmed having all items needed for baby including car seat and crib for safe sleeping area.     CSW identifies no further need for intervention and no barriers to discharge at this time.  Jermeka Schlotterbeck D. Thai Burgueno, MSW, LCSWA Clinical Social  Worker [phone] 

## 2020-01-29 NOTE — Anesthesia Postprocedure Evaluation (Signed)
Anesthesia Post Note  Patient: Tracy Gallegos  Procedure(s) Performed: AN AD HOC LABOR EPIDURAL     Patient location during evaluation: Mother Baby Anesthesia Type: Epidural Level of consciousness: awake and alert, oriented and patient cooperative Pain management: pain level controlled Vital Signs Assessment: post-procedure vital signs reviewed and stable Respiratory status: spontaneous breathing Cardiovascular status: stable Postop Assessment: no headache, epidural receding, patient able to bend at knees and no signs of nausea or vomiting Anesthetic complications: no Comments: Pt. States she is walking.  Pain score 1.     Last Vitals:  Vitals:   01/29/20 0125 01/29/20 0521  BP: (!) 105/53 113/74  Pulse: 63 64  Resp: 16 18  Temp: 36.7 C 36.6 C  SpO2: 100% 100%    Last Pain:  Vitals:   01/29/20 0521  TempSrc: Oral  PainSc: 1    Pain Goal:                   [facility]

## 2020-01-29 NOTE — Progress Notes (Signed)
Post Partum Day 1 S/P spontaneous vaginal  Feeding: breast Subjective: No HA, SOB, CP, F/C, breast symptoms. Normal vaginal bleeding, no clots. Pain controlled.  ambulating without symptoms.  voiding with some hesitation.     Objective: BP 113/74 (BP Location: Right Arm)   Pulse 64   Temp 97.8 F (36.6 C) (Oral)   Resp 18   Ht 5\' 10"  (1.778 m)   Wt 68.9 kg   LMP 05/25/2019   SpO2 100%   Breastfeeding Unknown   BMI 21.81 kg/m  I&O reviewed.   Physical Exam:  General: alert, cooperative and anxious Lochia: appropriate Uterine Fundus: firm but above umbilicus, will have pt void then re-eval DVT Evaluation: No evidence of DVT seen on physical exam. Ext: No c/c/e Recent Labs    01/28/20 0821  HGB 14.6  HCT 43.2      Assessment/Plan: 34 y.o.  PPD #1 .  normal postpartum exam Continue current postpartum care Ambulate   LOS: 1 day   Ala Dach 01/29/2020 12:06 PM

## 2020-01-30 LAB — RH IG WORKUP (INCLUDES ABO/RH)
ABO/RH(D): O NEG
Fetal Screen: NEGATIVE
Gestational Age(Wks): 39.1
Unit division: 0

## 2020-01-30 MED ORDER — CALCIUM CARBONATE ANTACID 500 MG PO CHEW
2.0000 | CHEWABLE_TABLET | ORAL | Status: DC | PRN
  Administered 2020-01-30: 400 mg via ORAL
  Filled 2020-01-30: qty 2

## 2020-01-30 MED ORDER — IBUPROFEN 600 MG PO TABS: 600.0000 mg | ORAL_TABLET | Freq: Four times a day (QID) | ORAL | 0 refills | Status: DC

## 2020-01-30 MED ORDER — ACETAMINOPHEN 325 MG PO TABS: 650.0000 mg | ORAL_TABLET | ORAL | 1 refills | Status: DC | PRN

## 2020-01-30 MED ORDER — FAMOTIDINE 20 MG PO TABS
40.0000 mg | ORAL_TABLET | Freq: Every day | ORAL | Status: DC
  Administered 2020-01-30 (×2): 40 mg via ORAL
  Filled 2020-01-30 (×3): qty 2

## 2020-01-30 NOTE — Discharge Summary (Signed)
OB Discharge Summary  Patient Name: Tracy Gallegos DOB: 1986/04/30 MRN: [id number]  Date of admission: 01/28/2020 Delivering MD: Aloha Gell   Date of discharge: 01/30/2020  Admitting diagnosis: Encounter for planned induction of labor [Z34.90] Intrauterine pregnancy: [redacted]w[redacted]d     Secondary diagnosis:Active Problems:   Encounter for planned induction of labor  Additional problems: Anxiety     Discharge diagnosis: Term Pregnancy Delivered                                                                     Post partum procedures:None  Augmentation: AROM and Pitocin  Complications: None  Hospital course:  Induction of Labor With Vaginal Delivery   34 y.o. yo G3P3003 at [redacted]w[redacted]d was admitted to the hospital 01/28/2020 for induction of labor.  Indication for induction: Favorable cervix at term and A2 DM.  Patient had an uncomplicated labor course as follows: Membrane Rupture Time/Date: 12:05 PM ,01/28/2020   Intrapartum Procedures: Episiotomy: None [1]                                         Lacerations:  1st degree [2]  Patient had delivery of a Viable infant.  Information for the patient's newborn:  Charlyse, Coro Girl Maeli [id number]  Delivery Method: Vaginal, Spontaneous(Filed from Delivery Summary)    01/28/2020  Details of delivery can be found in separate delivery note.  Patient had a routine postpartum course. Patient is discharged home 01/30/20.  Physical exam  Vitals:   01/29/20 0521 01/29/20 1448 01/29/20 2208 01/30/20 0517  BP: 113/74 120/87 106/70 115/70  Pulse: 64 78 64 66  Resp: 18 18 18 18   Temp: 97.8 F (36.6 C) 98.6 F (37 C) 98.3 F (36.8 C) 98.3 F (36.8 C)  TempSrc: Oral Oral Oral Oral  SpO2: 100%   95%  Weight:      Height:       General: alert, cooperative and no distress Lochia: appropriate Uterine Fundus: firm Incision: N/A DVT Evaluation: No evidence of DVT seen on physical exam. Labs: Lab Results  Component Value Date   WBC  10.8 (H) 01/28/2020   HGB 14.6 01/28/2020   HCT 43.2 01/28/2020   MCV 94.7 01/28/2020   PLT 198 01/28/2020   No flowsheet data found.  Discharge instruction: per After Visit Summary and "Baby and Me Booklet".  After Visit Meds:  Allergies as of 01/30/2020      Reactions   Nitrous Oxide Nausea And Vomiting   Patient would like to try during labor. States it has been 20 years since she experienced this.      Medication List    STOP taking these medications   amphetamine-dextroamphetamine 10 MG tablet Commonly known as: Adderall   amphetamine-dextroamphetamine 5 MG tablet Commonly known as: ADDERALL   predniSONE 10 MG (21) Tbpk tablet Commonly known as: STERAPRED UNI-PAK 21 TAB     TAKE these medications   acetaminophen 325 MG tablet Commonly known as: Tylenol Take 2 tablets (650 mg total) by mouth every 4 (four) hours as needed (for pain scale < 4).   buPROPion 75 MG tablet Commonly known as:  WELLBUTRIN Take 1 tablet (75 mg total) by mouth 2 (two) times daily.   busPIRone 7.5 MG tablet Commonly known as: BUSPAR Take 1 tablet (7.5 mg total) by mouth 3 (three) times daily. Needs appt with pcp for additional refills   desoximetasone 0.25 % cream Commonly known as: Topicort Apply 1 application topically 2 (two) times daily.   FLUoxetine 20 MG capsule Commonly known as: PROZAC TAKE 2 CAPSULES BY MOUTH ONCE A DAY (ALONG WITH 10MG  STRENGTH) What changed:   how much to take  when to take this  additional instructions   ibuprofen 600 MG tablet Commonly known as: ADVIL Take 1 tablet (600 mg total) by mouth every 6 (six) hours.   prenatal multivitamin Tabs tablet Take 1 tablet by mouth daily at 12 noon.       Diet: routine diet  Activity: Advance as tolerated. Pelvic rest for 6 weeks.   Outpatient follow up:6 weeks Follow up Appt:No future appointments. Follow up visit: No follow-ups on file.  Postpartum contraception: IUD [place]  Newborn Data: Live  born female  Birth Weight: 7 lb 8.5 oz (3416 g) APGAR: 6, 9  Newborn Delivery   Birth date/time: 01/28/2020 15:02:00 Delivery type: Vaginal, Spontaneous      Baby Feeding: Breast Disposition:home with mother   01/30/2020 Ala Dach, MD

## 2020-01-30 NOTE — Lactation Note (Signed)
This note was copied from a baby's chart. Lactation Consultation Note  Patient Name: Tracy Gallegos [id number] Date: 01/30/2020 Reason for consult: Follow-up assessment;Term  P3 mother whose infant is now 33 hours old.  Mother breast fed her first child (now 33 years old) for 15 months and her second child (now 8 months) for 14 months.    Mother was breast feeding when I arrived.  Baby was latched to the right breast with wide gape and flanged lips.  Intermittent swallows noted.  Mother is a very experienced breast feeding mother and has her plan in place.  She is independent and desires little assistance but is receptive to learning.    Encouraged to continue feeding 8-12 times/24 hours or sooner if baby shows feeding cues.  Mother is familiar with hand expression and engorgement, stating she has been through mastitis in the past.  Mother has been using coconut oil and comfort gels after feedings.  Reminded her to use her EBM to rub into nipples/areolas prior to using coconut oil or comfort gels.  Mother stated she has been using a drop of EBM to her nipples.  Provided a pack of comfort gels to take home and suggested she refrigerate them for added comfort and relief.    Baby will be getting another bilirubin level drawn today around 1400 and parents hope to be discharged later today.  Suggested mother use our OP services as needed and provided the phone numbers for OP and our office.  Mother is familiar with our OP services and visited multiple times with her special needs child who is now 60 months old.  Even though this baby seems to be latching well today she has the information in case she would like to set up a consult.  Mother appreciative.  Mother has a manual pump and a DEBP for home use.  Father present and very supportive.  Mother stated that her husband is a great help for her.  Praised the parents for their continued support of each other and mother's dedication to breast feeding.      Maternal Data Formula Feeding for Exclusion: No Has patient been taught Hand Expression?: Yes  Feeding Feeding Type: Breast Fed  LATCH Score Latch: Grasps breast easily, tongue down, lips flanged, rhythmical sucking.  Audible Swallowing: Spontaneous and intermittent  Type of Nipple: Everted at rest and after stimulation  Comfort (Breast/Nipple): Soft / non-tender  Hold (Positioning): Assistance needed to correctly position infant at breast and maintain latch.  LATCH Score: 9  Interventions Interventions: Breast feeding basics reviewed;Skin to skin;Hand express;Breast compression;Comfort gels;Coconut oil;Position options;Support pillows  Lactation Tools Discussed/Used Tools: Coconut oil;Comfort gels   Consult Status Consult Status: Complete Date: 01/30/20 Follow-up type: Call as needed    Kalinda Romaniello R Espn Zeman 01/30/2020, 11:57 AM

## 2020-01-30 NOTE — Progress Notes (Signed)
Pt c/o heartburn and requesting tums and pepcid. Dr. Pamala Hurry called and orders received.

## 2020-02-04 ENCOUNTER — Inpatient Hospital Stay (HOSPITAL_COMMUNITY): Payer: 59

## 2020-02-04 ENCOUNTER — Telehealth: Payer: Self-pay | Admitting: Obstetrics and Gynecology

## 2020-02-04 ENCOUNTER — Other Ambulatory Visit: Payer: Self-pay | Admitting: Obstetrics and Gynecology

## 2020-02-04 ENCOUNTER — Other Ambulatory Visit: Payer: Self-pay

## 2020-02-04 ENCOUNTER — Inpatient Hospital Stay (HOSPITAL_COMMUNITY)
Admission: AD | Admit: 2020-02-04 | Discharge: 2020-02-04 | Disposition: A | Discharge status: Home / Self Care | Payer: 59 | Attending: Obstetrics and Gynecology | Admitting: Obstetrics and Gynecology

## 2020-02-04 ENCOUNTER — Encounter (HOSPITAL_COMMUNITY): Payer: Self-pay | Admitting: Obstetrics and Gynecology

## 2020-02-04 DIAGNOSIS — R58 Hemorrhage, not elsewhere classified: Secondary | ICD-10-CM | POA: Diagnosis not present

## 2020-02-04 DIAGNOSIS — L308 Other specified dermatitis: Secondary | ICD-10-CM | POA: Diagnosis not present

## 2020-02-04 DIAGNOSIS — O85 Puerperal sepsis: Secondary | ICD-10-CM | POA: Diagnosis not present

## 2020-02-04 DIAGNOSIS — O021 Missed abortion: Secondary | ICD-10-CM | POA: Diagnosis not present

## 2020-02-04 DIAGNOSIS — N939 Abnormal uterine and vaginal bleeding, unspecified: Secondary | ICD-10-CM | POA: Diagnosis not present

## 2020-02-04 DIAGNOSIS — R42 Dizziness and giddiness: Secondary | ICD-10-CM | POA: Diagnosis not present

## 2020-02-04 DIAGNOSIS — L509 Urticaria, unspecified: Secondary | ICD-10-CM | POA: Diagnosis not present

## 2020-02-04 DIAGNOSIS — I959 Hypotension, unspecified: Secondary | ICD-10-CM | POA: Diagnosis not present

## 2020-02-04 LAB — CBC
HCT: 38.2 % (ref 36.0–46.0)
Hemoglobin: 13.2 g/dL (ref 12.0–15.0)
MCH: 32.4 pg (ref 26.0–34.0)
MCHC: 34.6 g/dL (ref 30.0–36.0)
MCV: 93.6 fL (ref 80.0–100.0)
Platelets: 250 10*3/uL (ref 150–400)
RBC: 4.08 MIL/uL (ref 3.87–5.11)
RDW: 12.5 % (ref 11.5–15.5)
WBC: 10.8 10*3/uL — ABNORMAL HIGH (ref 4.0–10.5)
nRBC: 0 % (ref 0.0–0.2)

## 2020-02-04 LAB — TYPE AND SCREEN
ABO/RH(D): O NEG
Antibody Screen: POSITIVE

## 2020-02-04 MED ORDER — AMOXICILLIN-POT CLAVULANATE 875-125 MG PO TABS: 1.0000 | ORAL_TABLET | Freq: Two times a day (BID) | ORAL | 0 refills | Status: AC

## 2020-02-04 MED ORDER — METHYLERGONOVINE MALEATE 0.2 MG PO TABS
0.2000 mg | ORAL_TABLET | Freq: Once | ORAL | Status: AC
  Administered 2020-02-04: 0.2 mg via ORAL
  Filled 2020-02-04: qty 1

## 2020-02-04 MED ORDER — METHYLERGONOVINE MALEATE 0.2 MG PO TABS: 0.2000 mg | ORAL_TABLET | Freq: Three times a day (TID) | ORAL | 0 refills | Status: DC

## 2020-02-04 MED ORDER — METHYLERGONOVINE MALEATE 0.2 MG PO TABS: 0.2000 mg | ORAL_TABLET | Freq: Three times a day (TID) | ORAL | 0 refills | Status: AC

## 2020-02-04 MED ORDER — METHYLERGONOVINE MALEATE 0.2 MG/ML IJ SOLN
0.2000 mg | Freq: Once | INTRAMUSCULAR | Status: DC
  Filled 2020-02-04: qty 1

## 2020-02-04 MED ORDER — ACETAMINOPHEN 500 MG PO TABS
1000.0000 mg | ORAL_TABLET | Freq: Once | ORAL | Status: AC
  Administered 2020-02-04: 1000 mg via ORAL
  Filled 2020-02-04: qty 2

## 2020-02-04 MED ORDER — AMOXICILLIN-POT CLAVULANATE 875-125 MG PO TABS: 1.0000 | ORAL_TABLET | Freq: Two times a day (BID) | ORAL | 0 refills | Status: DC

## 2020-02-04 MED ORDER — CEPHALEXIN 500 MG PO CAPS: 500.0000 mg | ORAL_CAPSULE | Freq: Four times a day (QID) | ORAL | 0 refills | Status: DC

## 2020-02-04 MED ORDER — SODIUM CHLORIDE 0.9 % IV SOLN: INTRAVENOUS | Status: DC

## 2020-02-04 NOTE — Progress Notes (Signed)
[facility]

## 2020-02-04 NOTE — Telephone Encounter (Signed)
Phone call, pt needs an interim Rx for keflex til pharmacy can allow Augmentin Rx to be completed.  Rx escribed to [facility].

## 2020-02-04 NOTE — MAU Provider Note (Addendum)
Chief Complaint: No chief complaint on file.   First Provider Initiated Contact with Patient 02/04/20 1635     SUBJECTIVE HPI: Tracy Gallegos is a 34 y.o. G3P3003 at 1 week postpartum who presents to Maternity Admissions via EMS from the office for vaginal bleeding. Had an SVD on 3/19. Went to the office today for vaginal bleeding. States her bleeding increased last night & she passed what she thought was blood clots vs tissue. Had something string-like hanging from her vagina; when she tried to pull on it she realized it was attached inside & caused pain.  Had an I-pass procedure done in the office this afternoon for possible retained placenta. Received 60 mg of toradol prior to procedure. Had increased bleeding after procedure & was given cytotec & methergine prior to being transported to MAU by EMS. States she was feeling light headed but those symptoms have improved. Has had increase in abdominal pain since procedure.  Goes to [facility]. Procedure performed in the office by Dr. Pamala Hurry this afternoon.   Location: abomen Quality: cramping Severity: 3 (by comparison to unmedicated childbirth - pt appears uncomfortable)/10 on pain scale Duration: hours Timing: intermittent Modifying factors: not improved with toradol Associated signs and symptoms: vaginal bleeding  Past Medical History:  Diagnosis Date  . Depression   . Endometriosis   . Gestational diabetes    metformin  . History of gestational diabetes 01/23/2018  . Ovarian cyst    Bilateral   OB History  Gravida Para Term Preterm AB Living  3 3 3     3   SAB TAB Ectopic Multiple Live Births        0 3    # Outcome Date GA Lbr Len/2nd Weight Sex Delivery Anes PTL Lv  3 Term 01/28/20 [redacted]w[redacted]d 02:32 / 00:45 3416 g F Vag-Spont EPI  LIV  2 Term 03/30/18 [redacted]w[redacted]d 03:14 / 01:58 2890 g M Vag-Spont EPI  LIV  1 Term 03/29/16 [redacted]w[redacted]d  3118 g F Vag-Spont EPI N LIV     Birth Comments: 4th degree   Past Surgical History:  Procedure  Laterality Date  . OVARIAN CYST SURGERY    . WISDOM TOOTH EXTRACTION     Social History   Socioeconomic History  . Marital status: Married    Spouse name: Leroy Sea  . Number of children: Not on file  . Years of education: Not on file  . Highest education level: Not on file  Occupational History  . Not on file  Tobacco Use  . Smoking status: Never Smoker  . Smokeless tobacco: Never Used  Substance and Sexual Activity  . Alcohol use: No  . Drug use: No  . Sexual activity: Yes    Birth control/protection: None  Other Topics Concern  . Not on file  Social History Narrative   Two children   Married to 2nd year family medicine resident at [employer]   Social Determinants of Health   Financial Resource Strain:   . Difficulty of Paying Living Expenses:   Food Insecurity:   . Worried About Charity fundraiser in the Last Year:   . Arboriculturist in the Last Year:   Transportation Needs:   . Film/video editor (Medical):   Marland Kitchen Lack of Transportation (Non-Medical):   Physical Activity:   . Days of Exercise per Week:   . Minutes of Exercise per Session:   Stress:   . Feeling of Stress :   Social Connections:   . Frequency  of Communication with Friends and Family:   . Frequency of Social Gatherings with Friends and Family:   . Attends Religious Services:   . Active Member of Clubs or Organizations:   . Attends Archivist Meetings:   Marland Kitchen Marital Status:   Intimate Partner Violence:   . Fear of Current or Ex-Partner:   . Emotionally Abused:   Marland Kitchen Physically Abused:   . Sexually Abused:    Family History  Problem Relation Age of Onset  . Obesity Mother   . Depression Mother   . Alcohol abuse Mother   . Heart disease Father   . Early death Father   . Obesity Brother   . Alcohol abuse Maternal Uncle   . Depression Maternal Uncle   . Hypertension Maternal Grandmother   . Depression Maternal Grandmother   . Alcohol abuse Maternal Grandmother   . Alzheimer's disease  Maternal Grandmother   . Depression Maternal Grandfather   . Stroke Maternal Grandfather   . Heart disease Maternal Grandfather   . Suicidality Maternal Grandfather   . Suicidality Paternal Grandmother   . Depression Paternal Grandmother   . Heart disease Paternal Grandfather   . Early death Paternal Grandfather    No current facility-administered medications on file prior to encounter.   Current Outpatient Medications on File Prior to Encounter  Medication Sig Dispense Refill  . acetaminophen (TYLENOL) 325 MG tablet Take 2 tablets (650 mg total) by mouth every 4 (four) hours as needed (for pain scale < 4). 60 tablet 1  . amoxicillin-clavulanate (AUGMENTIN) 875-125 MG tablet Take 1 tablet by mouth 2 (two) times daily.    Marland Kitchen buPROPion (WELLBUTRIN SR) 150 MG 12 hr tablet Take 150 mg by mouth 2 (two) times daily.    Marland Kitchen buPROPion (WELLBUTRIN) 75 MG tablet Take 1 tablet (75 mg total) by mouth 2 (two) times daily. 180 tablet 3  . busPIRone (BUSPAR) 7.5 MG tablet Take 1 tablet (7.5 mg total) by mouth 3 (three) times daily. Needs appt with pcp for additional refills 90 tablet 0  . desoximetasone (TOPICORT) 0.25 % cream Apply 1 application topically 2 (two) times daily. 30 g 0  . FLUoxetine (PROZAC) 20 MG capsule TAKE 2 CAPSULES BY MOUTH ONCE A DAY (ALONG WITH 10MG  STRENGTH) (Patient taking differently: 40 mg daily. TAKE 2 CAPSULES BY MOUTH ONCE A DAY) 180 capsule 3  . ibuprofen (ADVIL) 600 MG tablet Take 1 tablet (600 mg total) by mouth every 6 (six) hours. 30 tablet 0  . methylergonovine (METHERGINE) 0.2 MG tablet     . Prenatal Vit-Fe Fumarate-FA (PRENATAL MULTIVITAMIN) TABS tablet Take 1 tablet by mouth daily at 12 noon.    . Prenatal-DSS-FeCb-FeGl-FA (CITRANATAL BLOOM) 90-1 MG TABS      Allergies  Allergen Reactions  . Nitrous Oxide Nausea And Vomiting    Patient would like to try during labor. States it has been 20 years since she experienced this.    I have reviewed patient's Past Medical  Hx, Surgical Hx, Family Hx, Social Hx, medications and allergies.   Review of Systems  Constitutional: Negative.   Gastrointestinal: Positive for abdominal pain.  Genitourinary: Positive for vaginal bleeding.    OBJECTIVE Patient Vitals for the past 24 hrs:  BP Temp Temp src Pulse Resp SpO2 Height Weight  02/04/20 1704 135/81 - - 75 - - - -  02/04/20 1630 127/75 - - 67 - 100 % - -  02/04/20 1617 118/76 99.3 F (37.4 C) Oral 76 18 100 %  5\' 9"  (1.753 m) 64 kg   Constitutional: Well-developed, well-nourished female in no acute distress.  Cardiovascular: normal rate & rhythm, no murmur Respiratory: normal rate and effort. Lung sounds clear throughout GI: Abd soft, non-tender, Pos BS x 4. No guarding or rebound tenderness MS: Extremities nontender, no edema, normal ROM Neurologic: Alert and oriented x 4.  GU:  3x6 cm blood staining on pad with small trickle from introitus   LAB RESULTS Results for orders placed or performed during the hospital encounter of 02/04/20 (from the past 24 hour(s))  CBC     Status: Abnormal   Collection Time: 02/04/20  5:17 PM  Result Value Ref Range   WBC 10.8 (H) 4.0 - 10.5 K/uL   RBC 4.08 3.87 - 5.11 MIL/uL   Hemoglobin 13.2 12.0 - 15.0 g/dL   HCT 38.2 36.0 - 46.0 %   MCV 93.6 80.0 - 100.0 fL   MCH 32.4 26.0 - 34.0 pg   MCHC 34.6 30.0 - 36.0 g/dL   RDW 12.5 11.5 - 15.5 %   Platelets 250 150 - 400 K/uL   nRBC 0.0 0.0 - 0.2 %  Type and screen     Status: None   Collection Time: 02/04/20  5:17 PM  Result Value Ref Range   ABO/RH(D) O NEG    Antibody Screen POS    Sample Expiration 02/07/2020,2359    Antibody Identification      PASSIVELY ACQUIRED ANTI-D Performed at [facility], [address] [place], [place] [address]     IMAGING No results found.  MAU COURSE Orders Placed This Encounter  Procedures  . US PELVIS (TRANSABDOMINAL ONLY)  . CBC  . Diet NPO time specified  . Orthostatic vital signs  . Type and screen   Meds  ordered this encounter  Medications  . 0.9 %  sodium chloride infusion  . acetaminophen (TYLENOL) tablet 1,000 mg    MDM Hemoglobin stable at 13.2 Not orthostatic Patient reports marked improvement after IV fluids & tylenol  Dr. Glo Herring in with myself to speak with patient regarding POC. Will get ultrasound here to confirm whether retained placenta. Patient & spouse agreeable with plan.   [facility] Dr[facility] Pamala Hurry today) since their pharmacy is closed for the weekend.   Ultrasound pending Care turned over to [facility]  Jorje Guild, NP 02/04/2020  7:01 PM   Results for orders placed or performed during the hospital encounter of 02/04/20 (from the past 24 hour(s))  CBC     Status: Abnormal   Collection Time: 02/04/20  5:17 PM  Result Value Ref Range   WBC 10.8 (H) 4.0 - 10.5 K/uL   RBC 4.08 3.87 - 5.11 MIL/uL   Hemoglobin 13.2 12.0 - 15.0 g/dL   HCT 38.2 36.0 - 46.0 %   MCV 93.6 80.0 - 100.0 fL   MCH 32.4 26.0 - 34.0 pg   MCHC 34.6 30.0 - 36.0 g/dL   RDW 12.5 11.5 - 15.5 %   Platelets 250 150 - 400 K/uL   nRBC 0.0 0.0 - 0.2 %  Type and screen     Status: None   Collection Time: 02/04/20  5:17 PM  Result Value Ref Range   ABO/RH(D) O NEG    Antibody Screen POS    Sample Expiration 02/07/2020,2359    Antibody Identification      PASSIVELY ACQUIRED ANTI-D Performed at [facility], [address] [place], [place] [address]    US PELVIS (TRANSABDOMINAL ONLY)  Result Date: 02/04/2020 CLINICAL DATA:  One week postpartum post spontaneous vaginal delivery. Head procedure and office today to remove retained products of conception. Persistent postpartum bleeding. Evaluate for retained products. EXAM: TRANSABDOMINAL ULTRASOUND OF PELVIS TECHNIQUE: Transabdominal ultrasound examination of the pelvis was performed including evaluation of the uterus, ovaries, adnexal regions, and pelvic cul-de-sac.  COMPARISON:  None. FINDINGS: Uterus Measurements: 14.6 x 8.1 x 8.8 cm = volume: 541 mL. Heterogeneous postpartum appearance. No fibroids or other mass visualized. Endometrium Thickness: Heterogeneous and thickened in the lower uterine segment measuring 15 mm. No definite endometrial vascularity. Right ovary Measurements: 2.8 x 3.2 x 1.4 cm = volume: 6.6 mL. Normal appearance/no adnexal mass. Left ovary Measurements: 2.8 x 1.7 x 1.7 cm = volume: 4.4 mL. Normal appearance/no adnexal mass. Other findings:  Trace free fluid in the pelvis. IMPRESSION: Heterogeneous endometrium which is thickened in the lower uterine segment measuring 15 mm. No definite endometrial blood flow. Findings are nonspecific and could indicate endometrial blood products, although hypovascular retained products of conception cannot be excluded. Electronically Signed   By: Keith Rake M.D.   On: 02/04/2020 19:28   MDM Jeronimo Greaves, CNM) --Ultrasound results reviewed with Dr. Glo Herring, discharge orders per this consult --Patient on phone with Dr. Pamala Hurry on CNM entry to MAU exam room. Patient's husband verbalizes that MAU discharge plans match those verbalized by Dr. Pamala Hurry on the phone --Patient and husband requesting Methergine dose prior to discharge, initially requested IM then patient verbalized preference for PO --Paper prescriptions per patient request  Meds ordered this encounter  Medications  . 0.9 %  sodium chloride infusion  . acetaminophen (TYLENOL) tablet 1,000 mg  . amoxicillin-clavulanate (AUGMENTIN) 875-125 MG tablet    Sig: Take 1 tablet by mouth 2 (two) times daily for 10 days.    Dispense:  20 tablet    Refill:  0    Order Specific Question:   Supervising Provider    Answer:   CONSTANT, PEGGY [[id number]]  . methylergonovine (METHERGINE) tablet 0.2 mg  . methylergonovine (METHERGINE) 0.2 MG tablet    Sig: Take 1 tablet (0.2 mg total) by mouth 3 (three) times daily for 3 days.    Dispense:  9 tablet    Refill:   0    Order Specific Question:   Supervising Provider    Answer:   CONSTANT, PEGGY [[id number]]   A/P: --Patient condition improved --VSS --Methergine rx per Dr. Glo Herring, first dose in MAU due to pharmacy closure --Discharge home in stable condition  F/U: --Patient to keep appointment scheduled for next week --Return to MAU PRN  Mallie Snooks, MSN, CNM Certified Nurse Midwife, [facility] for [employer], [employer] [facility] 02/04/20 8:41 PM

## 2020-02-04 NOTE — Discharge Instructions (Signed)

## 2020-02-05 MED FILL — AMOX-CLAV 875-125 MG TABLET: 875-125 | 10 days supply | Qty: 20 | Fill #0

## 2020-02-22 ENCOUNTER — Ambulatory Visit: Payer: Self-pay

## 2020-02-22 NOTE — Lactation Note (Signed)
This note was copied from a baby's chart. 02/22/20  Name: Tracy Gallegos MRN: [id number] Date of Birth: 01/28/2019 Gestational Age: Gestational Age: [redacted]w[redacted]d Birth Weight: 120.5 oz Weight today:  Weight: 8 lb 11.3 oz (3949 g)   General Information: Mother's reason for visit: Tracy Gallegos is not feeding well at the breast, requires bottle supplementation for all feedings. Consult: Initial Lactation consultant: Tracy Gerold, MSN, CNM, IBCLC) Breastfeeding experience: Breastfed her first two children, all had difficulties including 2nd with chromosomal deletion   Maternal medications: Pre-natal vitamin  Breastfeeding History: Frequency of breast feeding: q3-4oz Duration of feeding: 2-7min  Supplementation: Supplement method: bottle  Breast milk volume: 2-3oz Breast milk frequency: 6-8x/day Total breast milk volume per day: 12-24oz/day Pump type: Medela pump in style Pump frequency: Every time baby eats Pump volume: 3oz+ each time  Infant Output Assessment: Voids per 24 hours: 8+ Urine color: Clear yellow Stools per 24 hours: 8+ Stool color: Yellow  Breast Assessment: Breast: Soft, Compressible Nipple: Erect Pain level: 0   Feeding Assessment: Infant oral assessment: Variance Infant oral assessment comment: Tracy Gallegos has a thin, mobile lip frenulum that inserts at the gum ridge, and a posterior tongue tie that does not restrict motion of the tongue in any direction but may be causing some of the disorganized suck pattern. Tracy Gallegos has a very loose, disorganized suck, characteristic of hypotonia. She has a very active gag reflex, so getting her to suck on a gloved finger was challenging. She actively roots at the breast but latches shallowly. To get a bottle in her mouth, Tracy Gallegos has to allow her to begin VERY shallowly, then advance slowly and even this causes some gagging. She cannot maintain a seal around a gloved finger or the breast, so clicks and comes off  to swallow. At the breast, this causes a very loose latch with almost constant choking on any flow of milk. Positioning: Cross cradle(Roots at the breast, no stimulation needed for sucking but she pops off frequently to swallow.) Latch: 1 - Repeated attempts needed to sustain latch, nipple held in mouth throughout feeding, stimulation needed to elicit sucking reflex. Audible swallowing: 2 - Spontaneous and intermittent(Almost every swallow is accompanied by clicking, small inhales, and often choking.) Type of nipple: 2 - Everted at rest and after stimulation Comfort: 2 - Soft/non-tender Hold: 1 - Assistance needed to correctly position infant at breast and maintain latch LATCH score: 8 Latch assessment: Shallow Lips flanged: Yes Suck assessment: Nutritive Tools: Nipple shield 24 mm Pre-feed weight: 8lbs 11.3oz Post feed weight: 8lbs 11.8oz Amount transferred: .5oz Amount supplemented: 1oz  Additional Feeding Assessment: Tracy Gallegos tried repeatedly to get Tracy Gallegos to take more than the initial letdown and complete a full feeding without unlatching, but Tracy Gallegos was unable. She latches on and sucks shallowly until milk begins to flow, then unlatches (usually choking) to catch her breath. She did better with the #24NS but still pulled off once she began swallowing.   Lactation Consultation Note Tracy Gallegos is very concerned that Tracy Gallegos is following the path her 33yo with chromosomal deletion began. She reports that Tracy Gallegos latched well in the hospital and seemed to be gaining well, but has stopped tolerating more than the first few minutes of milk flow. Now she eats when woken every 3-4hrs, but only for 2-56min at the breast before unlatching and going back to sleep. Tracy Gallegos completes all feedings with a 2-3oz bottle of pumped milk. She has to pump every 2hrs during the day to keep up and is frustrated  with their routine.  Tracy Gallegos does have significant head lag, at least upper body  hypotonia, tongue thrusting, and an overall disorganized suck pattern. I inquired about her genetic workup, but she has only been tested for the one specific microdeletion (negative).  After observing Tracy Gallegos's suck pattern/behavior at the breast and ability to tolerate a bottle, I suggested a #24NS to help keep the nipple past her gag reflex and keep her tongue down hoping to encourage a better seal on the breast and ability to swallow without choking. She does better, but still pulled off after the initial letdown.   Reviewed koala hold, laidback feeding, and Dancer hold for more support of Tracy Gallegos's jaw at the breast. Suggested Tracy Gallegos needs further evaluation by pediatric dentist, speech, and potentially OT. Tracy Gallegos is not amenable to this yet. Revisions of tongue ties are contraindicated in children with hypotonia and caused significant problems for her son. She agreed to try nursing with the NS for the week with a weight check on Monday. If Tracy Gallegos has not improved, will refer back to pediatrician for further evaluation.  Tracy Carina, MSN, CNM, IBCLC 02/22/2020, 5:03 PM

## 2020-02-28 ENCOUNTER — Ambulatory Visit: Payer: Self-pay

## 2020-02-28 NOTE — Lactation Note (Signed)
This note was copied from a baby's chart. 02/28/2020  Name: Tracy Gallegos MRN: [id number] Date of Birth: 01/28/2019 Gestational Age: Gestational Age: [redacted]w[redacted]d Birth Weight: 120.5 oz Weight today:  Weight: 9 lb 1.7 oz (4131 g)   General Information: Mother's reason for visit: Latch and feeding evaluation Consult: Follow-up Lactation consultant: Tracy Gerold, MSN, CNM, IBCLC) Breastfeeding experience: Breastfed previous two children   Maternal medications: Pre-natal vitamin  Breastfeeding History: Frequency of breast feeding: q3-4hrs during the day, cluster feeding at night Duration of feeding: multiple 2-48min latches over 30+min  Supplementation: Supplement method: bottle   Formula volume: 2oz Formula frequency: Given once since last visit Total formula volume per day: 2oz (yesterday)   Pump type: Medela pump in style    Infant Output Assessment: Voids per 24 hours: 10+ Urine color: Clear yellow Stools per 24 hours: 10+ Stool color: Yellow  Breast Assessment: Breast: Soft, Compressible Nipple: Erect Pain level: 0   Feeding Assessment:   Positioning: Cross cradle Latch: 1 - Repeated attempts needed to sustain latch, nipple held in mouth throughout feeding, stimulation needed to elicit sucking reflex. Audible swallowing: 1 - A few with stimulation Type of nipple: 2 - Everted at rest and after stimulation Comfort: 2 - Soft/non-tender Hold: 1 - Assistance needed to correctly position infant at breast and maintain latch LATCH score: 7 Latch assessment: Shallow Lips flanged: Yes Suck assessment: Displays both Tools: Nipple shield 24 mm Pre-feed weight: 9lbs 2.5oz (with diaper) Post feed weight: 9lbs 2.7oz Amount transferred: .2oz   Additional Feeding Assessment:   Positioning: Technical brewer Latch: 2 - Grasps breast easily, tongue down, lips flanged, rhythmical sucking. Audible swallowing: 2 - Spontaneous and intermittent Type of nipple: 2 -  Everted at rest and after stimulation Comfort: 2 - Soft/non-tender Hold: 1 - Assistance needed to correctly position infant at breast and maintain latch(Used dancer hold) LATCH score: 9 Latch assessment: Deep Lips flanged: Yes Suck assessment: Nutritive   Pre-feed weight: 9lbs 2.7oz Post feed weight: 9lbs 3.7oz Amount transferred: 1oz   Totals: Total amount transferred: 1.2oz    Lactation Consultation Note Tracy Gallegos presented with Tracy Gallegos for a feeding/plan evaluation. She has been feeding Tracy Gallegos exclusively at the breast with and without the #24 nipple shield. Tracy Gallegos has gotten a bottle only once this week - she took 2oz last night (02/27/20). In correction to my previous note, Tracy Gallegos was doing all feedings at the breast, Tracy Gallegos would attempt to give a bottle but Tracy Gallegos would refuse, so to date almost all feedings have been at the breast.  Tracy Gallegos has been very sleepy and difficult to wake during the day, so feedings take a long time because she has to be woken and kept awake for feedings. She has been cluster feeding somewhat at night. Tracy Gallegos is concerned she didn't get enough this week and reports that [name] wants to begin bottle feedings to help with her workload and prevent bottle refusal as they had with their older son.  Tracy Gallegos has gained 6.4oz since 02/22/20 for an average of 1.1oz/day. Gave reassurance that this is an appropriate weight gain for her age.  Tracy Gallegos latched Tracy Gallegos to the left side using the #24NS, but she nursed for less than 50min and unlatched. Tried the right side using the Dancer hold and Tracy Gallegos was able to latch deeply and tolerate a full letdown with much quieter swallows and no choking. She transferred Tracy Gallegos in 18min. Reassured Tracy Gallegos that this was a huge improvement on their regular latch.  Spoke  to both parents (Tracy Gallegos) about Tracy Gallegos's progress. Agreed that if she can tolerate the bottle without choking, it's ok to  give one daily or overnight so Tracy Gallegos can sleep. Demonstrated side-lying bottle feeding with Dancer hold to [name] so Tracy Gallegos can take the bottle without choking as well (she does have a more pronounced gag reflex with the bottle than at the breast).   Tracy Gallegos to keep nursing at the breast throughout the day, pump before bed and after the first feeding, but allow Tracy Gallegos to give bottles at night.  Tracy Gallegos also to see the pediatrician tomorrow to begin exploring the cause of Tracy Gallegos's low energy and possible hypotonia.   Follow up in one week for plan evaluation and weight check.   Tracy Carina, MSN, CNM, IBCLC 02/28/2020, 5:03 PM

## 2020-03-01 ENCOUNTER — Other Ambulatory Visit: Payer: Self-pay

## 2020-03-01 ENCOUNTER — Encounter: Payer: Self-pay | Admitting: Family Medicine

## 2020-03-02 ENCOUNTER — Ambulatory Visit: Payer: 59 | Admitting: Family Medicine

## 2020-03-02 ENCOUNTER — Encounter: Payer: Self-pay | Admitting: Family Medicine

## 2020-03-02 VITALS — BP 118/60 | HR 100 | Temp 97.6°F | Ht 69.0 in | Wt 138.2 lb

## 2020-03-02 DIAGNOSIS — F418 Other specified anxiety disorders: Secondary | ICD-10-CM | POA: Diagnosis not present

## 2020-03-02 DIAGNOSIS — F32A Depression, unspecified: Secondary | ICD-10-CM

## 2020-03-02 DIAGNOSIS — O99342 Other mental disorders complicating pregnancy, second trimester: Secondary | ICD-10-CM

## 2020-03-02 DIAGNOSIS — F329 Major depressive disorder, single episode, unspecified: Secondary | ICD-10-CM

## 2020-03-02 DIAGNOSIS — D2261 Melanocytic nevi of right upper limb, including shoulder: Secondary | ICD-10-CM

## 2020-03-02 MED ORDER — FLUOXETINE HCL 20 MG PO CAPS: 40.0000 mg | ORAL_CAPSULE | Freq: Every day | ORAL | 3 refills | Status: DC

## 2020-03-02 NOTE — Progress Notes (Signed)
Tracy Gallegos is a 34 y.o. female  Chief Complaint  Patient presents with  . Follow-up    med refill-fluoxetine an mole on back thats been there for years but its changed and been painful   HPI: NITISHA Gallegos is a 34 y.o. female here for [facility] appt, previous PCP Dr. Deborra Medina, and medication refill. Pt has a PMHx significant for depression, anxiety, gestational DM, and endometriosis. She is 4+wks postpartum (daughter Tracy Gallegos). She is on lexapro 40mg  daily.  She was on wellbutrin in the past, as well as buspar and adderall - not taking any of these at this time.   She has a mole on her Rt upper back - change in size, color, gets irritated. She would like this removed. No personal h/o skin cancer.   Past Medical History:  Diagnosis Date  . Depression   . Endometriosis   . Gestational diabetes    metformin  . History of gestational diabetes 01/23/2018  . Ovarian cyst    Bilateral    Past Surgical History:  Procedure Laterality Date  . OVARIAN CYST SURGERY    . WISDOM TOOTH EXTRACTION      Social History   Socioeconomic History  . Marital status: Married    Spouse name: Leroy Sea  . Number of children: Not on file  . Years of education: Not on file  . Highest education level: Not on file  Occupational History  . Not on file  Tobacco Use  . Smoking status: Never Smoker  . Smokeless tobacco: Never Used  Substance and Sexual Activity  . Alcohol use: No  . Drug use: No  . Sexual activity: Yes    Birth control/protection: None  Other Topics Concern  . Not on file  Social History Narrative   Two children   Married to 2nd year family medicine resident at [employer]   Social Determinants of Health   Financial Resource Strain:   . Difficulty of Paying Living Expenses:   Food Insecurity:   . Worried About Charity fundraiser in the Last Year:   . Arboriculturist in the Last Year:   Transportation Needs:   . Film/video editor (Medical):   Marland Kitchen Lack of Transportation  (Non-Medical):   Physical Activity:   . Days of Exercise per Week:   . Minutes of Exercise per Session:   Stress:   . Feeling of Stress :   Social Connections:   . Frequency of Communication with Friends and Family:   . Frequency of Social Gatherings with Friends and Family:   . Attends Religious Services:   . Active Member of Clubs or Organizations:   . Attends Archivist Meetings:   Marland Kitchen Marital Status:   Intimate Partner Violence:   . Fear of Current or Ex-Partner:   . Emotionally Abused:   Marland Kitchen Physically Abused:   . Sexually Abused:     Family History  Problem Relation Age of Onset  . Obesity Mother   . Depression Mother   . Alcohol abuse Mother   . Heart disease Father   . Early death Father   . Obesity Brother   . Alcohol abuse Maternal Uncle   . Depression Maternal Uncle   . Hypertension Maternal Grandmother   . Depression Maternal Grandmother   . Alcohol abuse Maternal Grandmother   . Alzheimer's disease Maternal Grandmother   . Depression Maternal Grandfather   . Stroke Maternal Grandfather   . Heart disease Maternal Grandfather   .  Suicidality Maternal Grandfather   . Suicidality Paternal Grandmother   . Depression Paternal Grandmother   . Heart disease Paternal Grandfather   . Early death Paternal Grandfather      Immunization History  Administered Date(s) Administered  . Influenza, Seasonal, Injecte, Preservative Fre 08/03/2013  . Influenza,inj,Quad PF,6+ Mos 09/18/2017, 08/26/2018, 08/24/2019  . Tdap 01/23/2018    Outpatient Encounter Medications as of 03/02/2020  Medication Sig  . desoximetasone (TOPICORT) 0.25 % cream Apply 1 application topically 2 (two) times daily.  Marland Kitchen FLUoxetine (PROZAC) 20 MG capsule Take 2 capsules (40 mg total) by mouth daily. TAKE 2 CAPSULES BY MOUTH ONCE A DAY  . ibuprofen (ADVIL) 600 MG tablet Take 1 tablet (600 mg total) by mouth every 6 (six) hours.  . methylergonovine (METHERGINE) 0.2 MG tablet   . Prenatal  Vit-Fe Fumarate-FA (PRENATAL MULTIVITAMIN) TABS tablet Take 1 tablet by mouth daily at 12 noon.  . Prenatal-DSS-FeCb-FeGl-FA (CITRANATAL BLOOM) 90-1 MG TABS   . [DISCONTINUED] FLUoxetine (PROZAC) 20 MG capsule TAKE 2 CAPSULES BY MOUTH ONCE A DAY (ALONG WITH 10MG  STRENGTH) (Patient taking differently: 40 mg daily. TAKE 2 CAPSULES BY MOUTH ONCE A DAY)  . buPROPion (WELLBUTRIN SR) 150 MG 12 hr tablet Take 150 mg by mouth 2 (two) times daily.  . busPIRone (BUSPAR) 7.5 MG tablet Take 1 tablet (7.5 mg total) by mouth 3 (three) times daily. Needs appt with pcp for additional refills (Patient not taking: Reported on 03/02/2020)  . [DISCONTINUED] acetaminophen (TYLENOL) 325 MG tablet Take 2 tablets (650 mg total) by mouth every 4 (four) hours as needed (for pain scale < 4). (Patient not taking: Reported on 03/02/2020)  . [DISCONTINUED] buPROPion (WELLBUTRIN) 75 MG tablet Take 1 tablet (75 mg total) by mouth 2 (two) times daily. (Patient not taking: Reported on 03/02/2020)  . [DISCONTINUED] cephALEXin (KEFLEX) 500 MG capsule Take 1 capsule (500 mg total) by mouth 4 (four) times daily. (Patient not taking: Reported on 03/02/2020)   No facility-administered encounter medications on file as of 03/02/2020.     ROS: Pertinent positives and negatives noted in HPI. Remainder of ROS non-contributory    Allergies  Allergen Reactions  . Nitrous Oxide Nausea And Vomiting    Patient would like to try during labor. States it has been 20 years since she experienced this.    BP 118/60   Pulse 100   Temp 97.6 F (36.4 C) (Tympanic)   Ht 5\' 9"  (1.753 m)   Wt 138 lb 3.2 oz (62.7 kg)   LMP 05/25/2019   SpO2 96%   BMI 20.41 kg/m   Physical Exam  Constitutional: She is oriented to person, place, and time. She appears well-developed and well-nourished. No distress.  Pulmonary/Chest: No respiratory distress.  Neurological: She is alert and oriented to person, place, and time.  Skin:     Psychiatric: She has a  normal mood and affect. Her behavior is normal. Thought content normal.     A/P:  1. Depression with anxiety - chronic, stable Refill: - FLUoxetine (PROZAC) 20 MG capsule; Take 2 capsules (40 mg total) by mouth daily. TAKE 2 CAPSULES BY MOUTH ONCE A DAY  Dispense: 180 capsule; Refill: 3  2. Atypical nevus of shoulder, right - pt prefers to have this done at PCP office rather than derm. I said I would confirm with providers at another [facility] office to see if this can be accommodated and get back to pt

## 2020-03-05 NOTE — Patient Instructions (Addendum)
It is great to see you today, take care!    I expect to get your pathology report back within a week, I will be in touch with this ASAP  Keep the biopsy site clean and dry today, tomorrow you may shower as usual.  Keep a Band-Aid or other light bandage over the biopsy site as needed until it is no longer oozing or sore  If any sign of infection, such as redness, pus, swelling, heat, or pain please let me know right away  If any bleeding, apply firm pressure for 10 to 15 minutes.  If this does not resolve bleeding please seek help

## 2020-03-05 NOTE — Progress Notes (Signed)
Tracy Gallegos at [facility] [address], [place], [place] [address] [phone]  Date:  03/08/2020   Name:  [name]   DOB:  [birth year]   MRN:  [id number]  PCP:  Ronnald Nian, DO    Chief Complaint: Procedure (MOLE REMOVAL)   History of Present Illness:  Tracy Gallegos is a 34 y.o. very pleasant female patient who presents with the following:  Generally healthy young woman with history of gestational diabetes.  Seen today on request of her PCP, Dr. [name] for skin procedure  Her husband is a 12rd year FP resident and is completing his residency in the next few months They have 3 young children, including her 39-week-old daughter who is with her today She is currently nursing  She has noted a mole on her right shoulder that has been there for about 10 days-however, recently it seems to be growing larger, changing in color, may catch in her bra strap, and is generally concerning her.  She would like to have this removed today  Otherwise she is feeling well, no other concerns today  Patient Active Problem List   Diagnosis Date Noted  . Encounter for planned induction of labor 01/28/2020  . Fatigue 06/07/2019  . Depression with anxiety 06/30/2018  . History of gestational diabetes 06/30/2018  . Sleep disorder, unspecified 06/30/2018  . Gestational diabetes mellitus 03/30/2018  . 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 Anti-D antibodies present during pregnancy 2017-10-15    Past Medical History:  Diagnosis Date  . Depression   . Endometriosis   . Gestational diabetes    metformin  . History of gestational diabetes 01/23/2018  . Ovarian cyst    Bilateral    Past Surgical History:  Procedure Laterality Date  . OVARIAN CYST SURGERY    . WISDOM TOOTH EXTRACTION      Social History   Tobacco Use  . Smoking status: Never Smoker  . Smokeless tobacco: Never Used  Substance Use Topics  .  Alcohol use: No  . Drug use: No    Family History  Problem Relation Age of Onset  . Obesity Mother   . Depression Mother   . Alcohol abuse Mother   . Heart disease Father   . Early death Father   . Obesity Brother   . Alcohol abuse Maternal Uncle   . Depression Maternal Uncle   . Hypertension Maternal Grandmother   . Depression Maternal Grandmother   . Alcohol abuse Maternal Grandmother   . Alzheimer's disease Maternal Grandmother   . Depression Maternal Grandfather   . Stroke Maternal Grandfather   . Heart disease Maternal Grandfather   . Suicidality Maternal Grandfather   . Suicidality Paternal Grandmother   . Depression Paternal Grandmother   . Heart disease Paternal Grandfather   . Early death Paternal Grandfather     Allergies  Allergen Reactions  . Nitrous Oxide Nausea And Vomiting    Patient would like to try during labor. States it has been 20 years since she experienced this.    Medication list has been reviewed and updated.  Current Outpatient Medications on File Prior to Visit  Medication Sig Dispense Refill  . buPROPion (WELLBUTRIN SR) 150 MG 12 hr tablet Take 150 mg by mouth 2 (two) times daily.    Marland Kitchen desoximetasone (TOPICORT) 0.25 % cream Apply 1 application topically 2 (two) times daily. 30 g 0  . FLUoxetine (PROZAC) 20 MG capsule  Take 2 capsules (40 mg total) by mouth daily. TAKE 2 CAPSULES BY MOUTH ONCE A DAY 180 capsule 3  . ibuprofen (ADVIL) 600 MG tablet Take 1 tablet (600 mg total) by mouth every 6 (six) hours. 30 tablet 0  . methylergonovine (METHERGINE) 0.2 MG tablet     . Prenatal Vit-Fe Fumarate-FA (PRENATAL MULTIVITAMIN) TABS tablet Take 1 tablet by mouth daily at 12 noon.    . Prenatal-DSS-FeCb-FeGl-FA (CITRANATAL BLOOM) 90-1 MG TABS     . busPIRone (BUSPAR) 7.5 MG tablet Take 1 tablet (7.5 mg total) by mouth 3 (three) times daily. Needs appt with pcp for additional refills (Patient not taking: Reported on 03/02/2020) 90 tablet 0   No current  facility-administered medications on file prior to visit.    Review of Systems:  As per HPI- otherwise negative.   Physical Examination: Vitals:   03/08/20 1608  BP: 110/75  Pulse: 72  Resp: 16  Temp: 97.7 F (36.5 C)  SpO2: 97%   Vitals:   03/08/20 1608  Weight: 138 lb (62.6 kg)  Height: 5\' 9"  (1.753 m)   Body mass index is 20.38 kg/m. Ideal Body Weight: Weight in (lb) to have BMI = 25: 168.9  GEN: No acute distress; alert,appropriate.  Normal weight, looks well PULM: Breathing comfortably in no respiratory distress PSYCH: Normally interactive.  Her right posterior shoulder displays a small pigmented likely nevus.  It is slightly pedunculated.  I anticipate that I can remove this with a 3 mm punch  Verbal consent obtained.  Under clean conditions, area prepped with Betadine and alcohol, anesthesia obtained with 1 mL of 1% lidocaine with epi Punch biopsy of skin lesion, scissors used to remove lesion from tissue stalk.  Specimen to pathology Silver nitrate sticks used for hemostasis.  Band-Aid applied Patient tolerated procedure well without any immediate complications We went over post procedure wound care   Assessment and Plan: Skin lesion of back - Plan: Surgical pathology( [facility])  Patient here today for removal of small skin lesion on her right posterior shoulder as above.  Pathology report pending, I will be in touch with her when this comes in.  We discussed aftercare instructions, she will let me know if any concerns This visit occurred during the SARS-CoV-2 public health emergency.  Safety protocols were in place, including screening questions prior to the visit, additional usage of staff PPE, and extensive cleaning of exam room while observing appropriate contact time as indicated for disinfecting solutions.    Signed Lamar Blinks, MD

## 2020-03-08 ENCOUNTER — Other Ambulatory Visit: Payer: Self-pay

## 2020-03-08 ENCOUNTER — Encounter: Payer: Self-pay | Admitting: Family Medicine

## 2020-03-08 ENCOUNTER — Ambulatory Visit (INDEPENDENT_AMBULATORY_CARE_PROVIDER_SITE_OTHER): Payer: 59 | Admitting: Family Medicine

## 2020-03-08 ENCOUNTER — Other Ambulatory Visit: Payer: Self-pay | Admitting: Family Medicine

## 2020-03-08 VITALS — BP 110/75 | HR 72 | Temp 97.7°F | Resp 16 | Ht 69.0 in | Wt 138.0 lb

## 2020-03-08 DIAGNOSIS — L989 Disorder of the skin and subcutaneous tissue, unspecified: Secondary | ICD-10-CM | POA: Diagnosis not present

## 2020-03-08 DIAGNOSIS — D225 Melanocytic nevi of trunk: Secondary | ICD-10-CM | POA: Diagnosis not present

## 2020-03-09 MED FILL — PNV-DHA + DOCUSATE SOFTGEL: 27-1.25-300 | 30 days supply | Qty: 30 | Fill #2

## 2020-03-14 MED FILL — FLUoxetine HCL 20 MG CAPS: 20 | 90 days supply | Qty: 180 | Fill #0

## 2020-04-21 IMAGING — US US OB LIMITED
1 series · 8 of 8 positions shown · non-contrast
Comparison: none

[Series 1: us ob limited · 0.20mm/px · 8 of 8 slices shown]
[im 1/8]
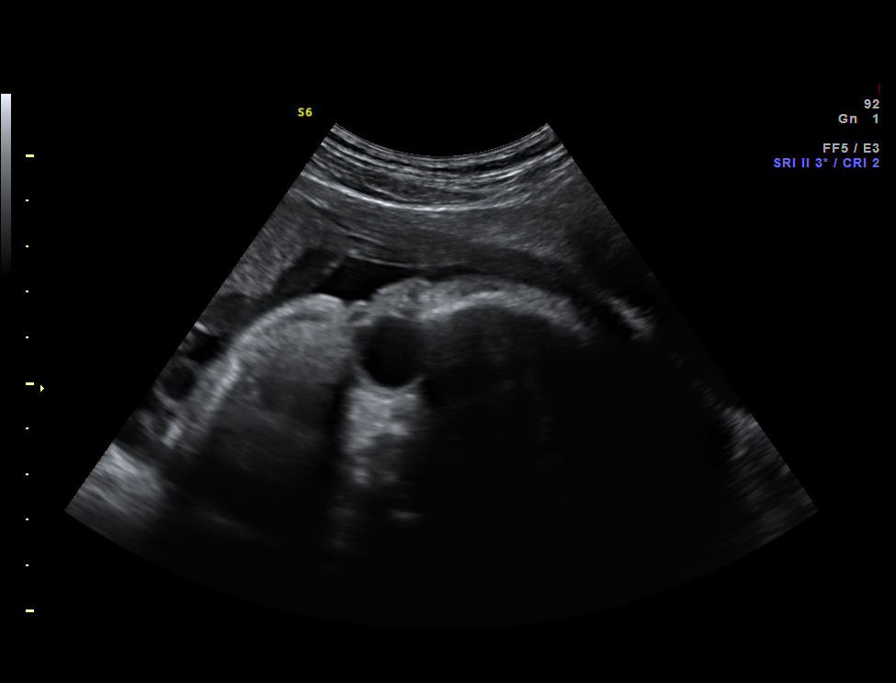
[im 2/8]
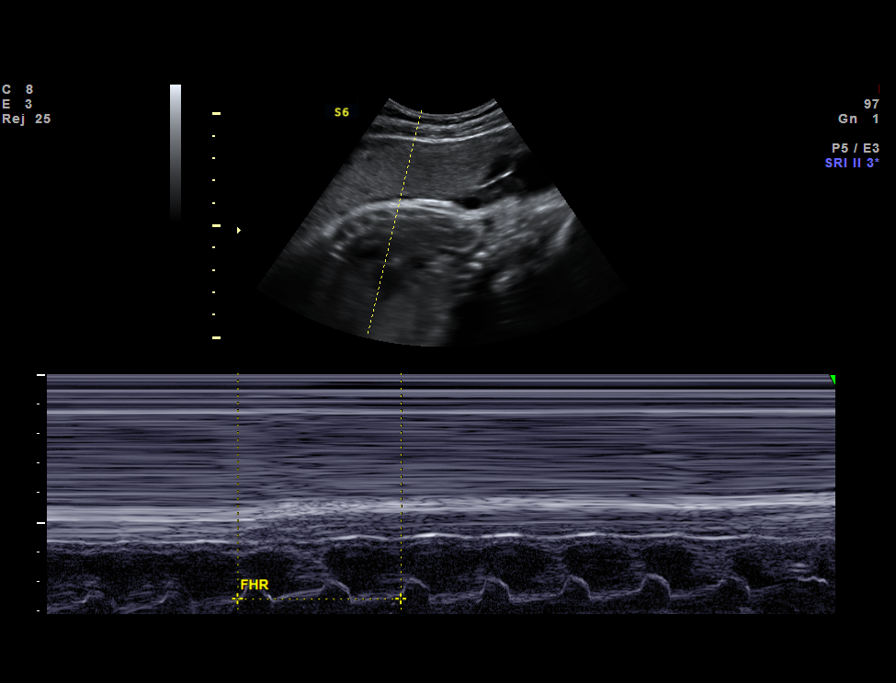
[im 3/8]
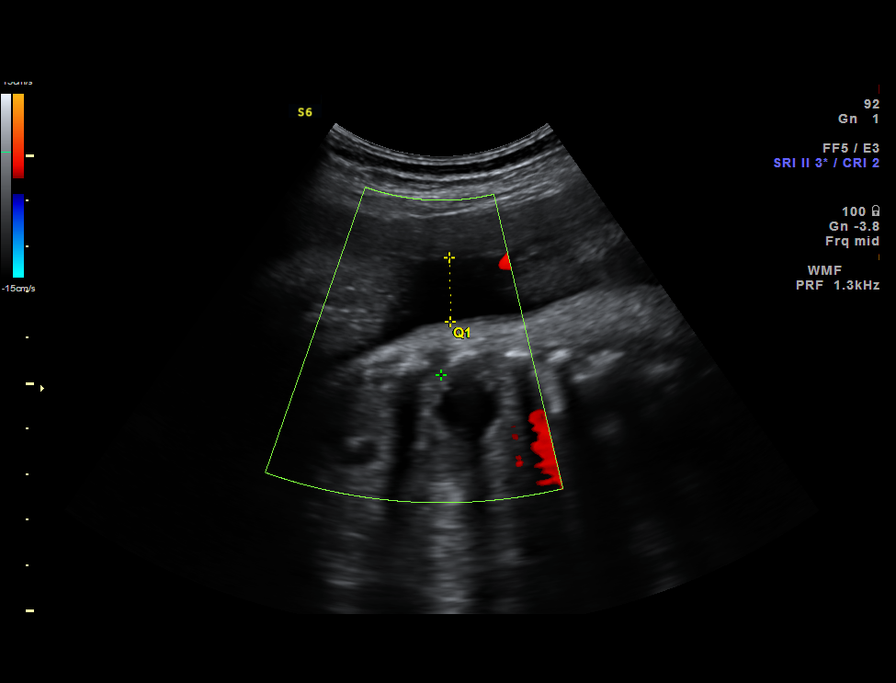
[im 4/8]
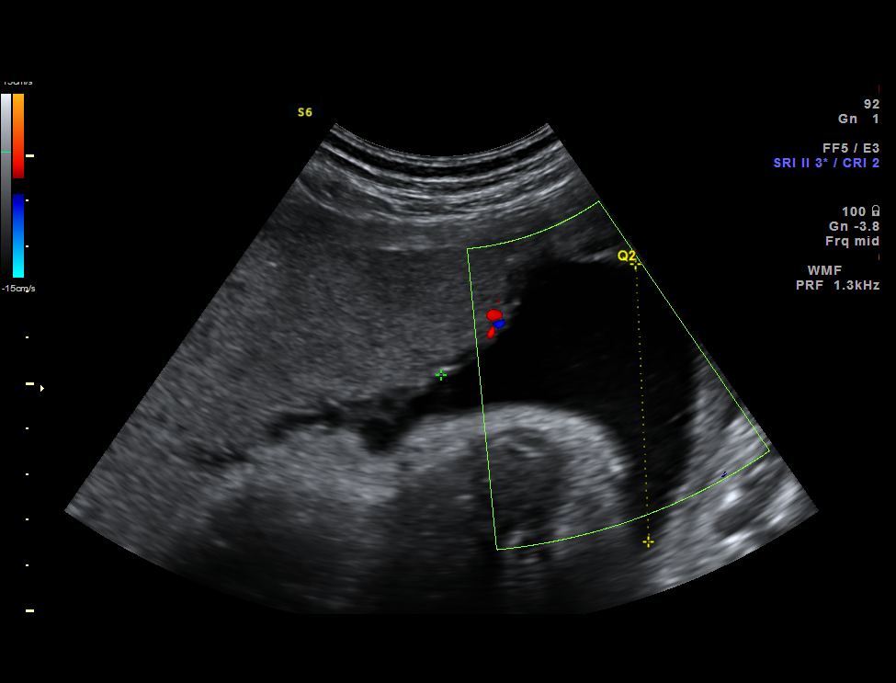
[im 5/8]
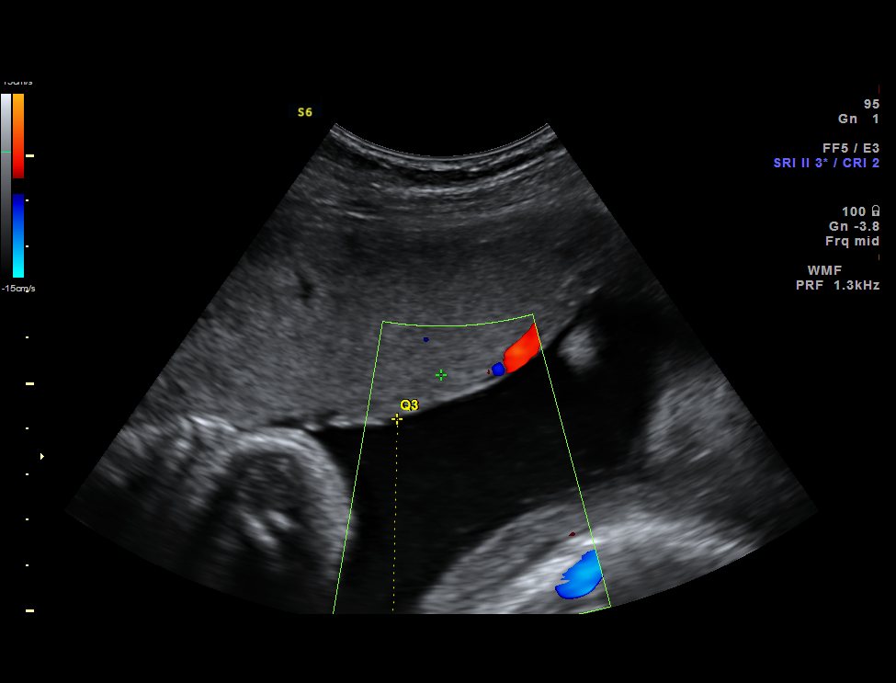
[im 6/8]
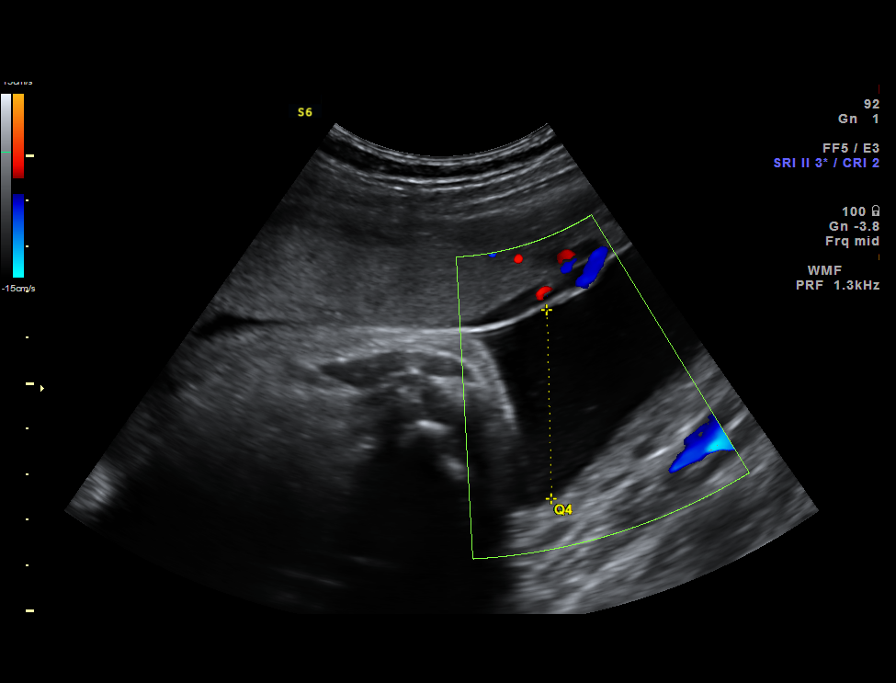
[im 7/8]
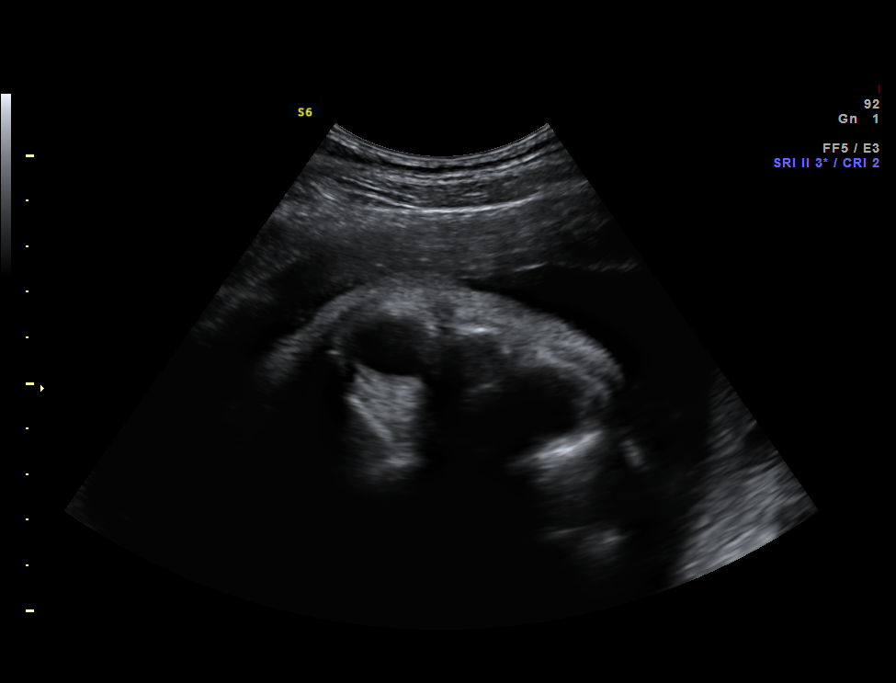
[im 8/8]
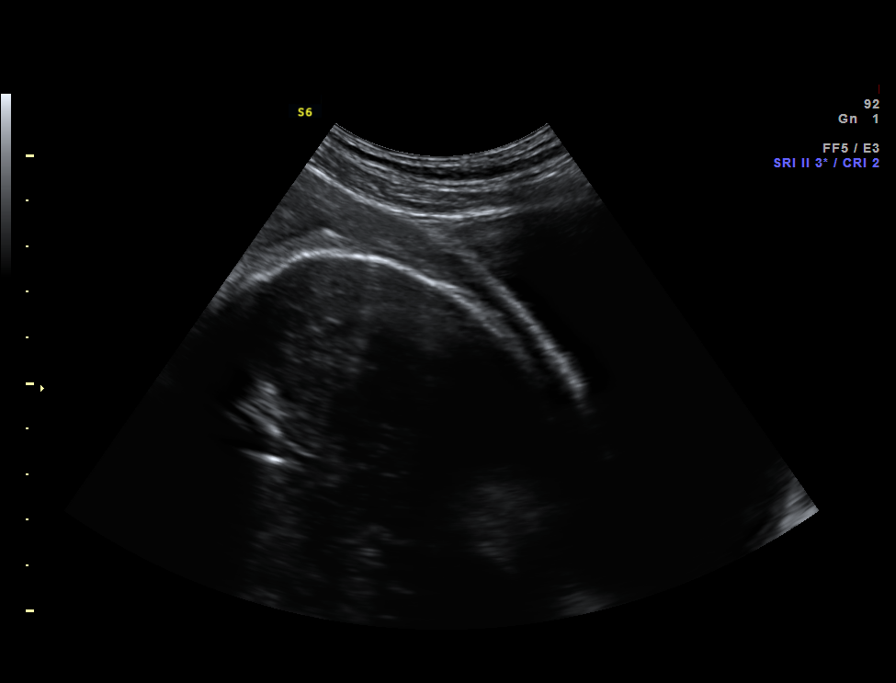

[8 of 8 positions shown; findings below may reference images not displayed]

Road [HOSPITAL]
[REDACTED]care

1  [HOSPITAL]                               [address].0

1  [facility]              [id number]      [id number]     [id number]
Indications

38 weeks gestation of pregnancy
Gestational diabetes in pregnancy,
controlled by oral hypoglycemic drugs
OB History

Blood Type:            Height:  5'8"   Weight (lb):  133       BMI:
Gravidity:    2         Term:   1
Living:       1
Fetal Evaluation

Num Of Fetuses:     1
Fetal Heart         152
Rate(bpm):
Cardiac Activity:   Observed
Presentation:       Cephalic

Amniotic Fluid
AFI FV:      Subjectively within normal limits

AFI Sum(cm)     %Tile       Largest Pocket(cm)
16.18           63

RUQ(cm)       RLQ(cm)       LUQ(cm)        LLQ(cm)
1.41
Gestational Age
LMP:           38w 0d        Date:  07/03/17                 EDD:   04/09/18
Best:          38w 0d     Det. By:  LMP  (07/03/17)          EDD:   04/09/18
Comments

Normal amniotic fluid volume.
Impression

Normal AFI.
Recommendations

Normal amniotic fluid, continue weekly testing until induction
of labor.

## 2020-04-24 MED FILL — PNV-DHA + DOCUSATE SOFTGEL: 27-1.25-300 | 30 days supply | Qty: 30 | Fill #3

## 2020-07-03 ENCOUNTER — Telehealth: Payer: Self-pay | Admitting: Family Medicine

## 2020-07-03 NOTE — Telephone Encounter (Signed)
Patient needs to be seen for possible double ear infection with drainage. Can this be in office or should it be a VV? Please advise.

## 2020-07-04 ENCOUNTER — Other Ambulatory Visit: Payer: Self-pay

## 2020-07-04 NOTE — Telephone Encounter (Signed)
Pt has an appointment 8/25.

## 2020-07-05 ENCOUNTER — Ambulatory Visit: Payer: Self-pay | Admitting: Family Medicine

## 2020-07-05 ENCOUNTER — Ambulatory Visit (INDEPENDENT_AMBULATORY_CARE_PROVIDER_SITE_OTHER): Payer: BC Managed Care – PPO | Admitting: Family Medicine

## 2020-07-05 ENCOUNTER — Encounter: Payer: Self-pay | Admitting: Family Medicine

## 2020-07-05 VITALS — BP 112/58 | HR 79 | Temp 97.4°F | Ht 69.0 in | Wt 140.2 lb

## 2020-07-05 DIAGNOSIS — L989 Disorder of the skin and subcutaneous tissue, unspecified: Secondary | ICD-10-CM

## 2020-07-05 DIAGNOSIS — H60503 Unspecified acute noninfective otitis externa, bilateral: Secondary | ICD-10-CM | POA: Diagnosis not present

## 2020-07-05 DIAGNOSIS — Z23 Encounter for immunization: Secondary | ICD-10-CM | POA: Diagnosis not present

## 2020-07-05 MED ORDER — OFLOXACIN 0.3 % OT SOLN: 5.0000 [drp] | Freq: Two times a day (BID) | OTIC | 0 refills | Status: AC

## 2020-07-05 NOTE — Progress Notes (Signed)
Tracy Gallegos is a 34 y.o. female  Chief Complaint  Patient presents with  . Acute Visit    bilateral ear pain x 3 weeks, has taken naproxen with little relief, wants tht HPV shot, and discuss the mole on her back   Pt is 8 min late to her appt.  HPI: Tracy Gallegos is a 34 y.o. female who complains of B/L ear pain x 3 weeks. + ear pressure, fullness. No fever, chills.  She had URI symptoms x 4 weeks in July. Covid PCR test - negative.  She states up until 2-3 days ago it hurt to lay her head on her pillow.  She has taken naproxen with good relief.   She would also like HPV vaccine.   She had a mole removed from her back by Dr. Janett Billow Copland in 02/2020. Pt is concerned entire area was not removed.    Past Medical History:  Diagnosis Date  . Depression   . Endometriosis   . Gestational diabetes    metformin  . History of gestational diabetes 01/23/2018  . Ovarian cyst    Bilateral    Past Surgical History:  Procedure Laterality Date  . OVARIAN CYST SURGERY    . WISDOM TOOTH EXTRACTION      Social History   Socioeconomic History  . Marital status: Married    Spouse name: Leroy Sea  . Number of children: Not on file  . Years of education: Not on file  . Highest education level: Not on file  Occupational History  . Not on file  Tobacco Use  . Smoking status: Never Smoker  . Smokeless tobacco: Never Used  Vaping Use  . Vaping Use: Never used  Substance and Sexual Activity  . Alcohol use: No  . Drug use: No  . Sexual activity: Yes    Birth control/protection: None  Other Topics Concern  . Not on file  Social History Narrative   Two children   Married to 2nd year family medicine resident at [employer]   Social Determinants of Health   Financial Resource Strain:   . Difficulty of Paying Living Expenses: Not on file  Food Insecurity:   . Worried About Charity fundraiser in the Last Year: Not on file  . Ran Out of Food in the Last Year: Not on file    Transportation Needs:   . Lack of Transportation (Medical): Not on file  . Lack of Transportation (Non-Medical): Not on file  Physical Activity:   . Days of Exercise per Week: Not on file  . Minutes of Exercise per Session: Not on file  Stress:   . Feeling of Stress : Not on file  Social Connections:   . Frequency of Communication with Friends and Family: Not on file  . Frequency of Social Gatherings with Friends and Family: Not on file  . Attends Religious Services: Not on file  . Active Member of Clubs or Organizations: Not on file  . Attends Archivist Meetings: Not on file  . Marital Status: Not on file  Intimate Partner Violence:   . Fear of Current or Ex-Partner: Not on file  . Emotionally Abused: Not on file  . Physically Abused: Not on file  . Sexually Abused: Not on file    Family History  Problem Relation Age of Onset  . Obesity Mother   . Depression Mother   . Alcohol abuse Mother   . Heart disease Father   . Early death  Father   . Obesity Brother   . Alcohol abuse Maternal Uncle   . Depression Maternal Uncle   . Hypertension Maternal Grandmother   . Depression Maternal Grandmother   . Alcohol abuse Maternal Grandmother   . Alzheimer's disease Maternal Grandmother   . Depression Maternal Grandfather   . Stroke Maternal Grandfather   . Heart disease Maternal Grandfather   . Suicidality Maternal Grandfather   . Suicidality Paternal Grandmother   . Depression Paternal Grandmother   . Heart disease Paternal Grandfather   . Early death Paternal Grandfather      Immunization History  Administered Date(s) Administered  . Influenza, Seasonal, Injecte, Preservative Fre 08/03/2013  . Influenza,inj,Quad PF,6+ Mos 09/18/2017, 08/26/2018, 08/24/2019  . Tdap 01/23/2018    Outpatient Encounter Medications as of 07/05/2020  Medication Sig  . FLUoxetine (PROZAC) 20 MG capsule Take 2 capsules (40 mg total) by mouth daily. TAKE 2 CAPSULES BY MOUTH ONCE A DAY   . ibuprofen (ADVIL) 600 MG tablet Take 1 tablet (600 mg total) by mouth every 6 (six) hours.  . Prenatal Vit-Fe Fumarate-FA (PRENATAL MULTIVITAMIN) TABS tablet Take 1 tablet by mouth daily at 12 noon.  . Prenatal-DSS-FeCb-FeGl-FA (CITRANATAL BLOOM) 90-1 MG TABS   . buPROPion (WELLBUTRIN SR) 150 MG 12 hr tablet Take 150 mg by mouth 2 (two) times daily. (Patient not taking: Reported on 07/05/2020)  . busPIRone (BUSPAR) 7.5 MG tablet Take 1 tablet (7.5 mg total) by mouth 3 (three) times daily. Needs appt with pcp for additional refills (Patient not taking: Reported on 03/02/2020)  . desoximetasone (TOPICORT) 0.25 % cream Apply 1 application topically 2 (two) times daily. (Patient not taking: Reported on 07/05/2020)  . methylergonovine (METHERGINE) 0.2 MG tablet  (Patient not taking: Reported on 07/05/2020)   No facility-administered encounter medications on file as of 07/05/2020.     ROS: Pertinent positives and negatives noted in HPI. Remainder of ROS non-contributory    Allergies  Allergen Reactions  . Nitrous Oxide Nausea And Vomiting    Patient would like to try during labor. States it has been 20 years since she experienced this.    BP (!) 112/58   Pulse 79   Temp (!) 97.4 F (36.3 C) (Tympanic)   Ht 5\' 9"  (1.753 m)   Wt 140 lb 3.2 oz (63.6 kg)   SpO2 97%   BMI 20.70 kg/m   Physical Exam Constitutional:      Appearance: Normal appearance.  HENT:     Right Ear: Hearing normal. Swelling and tenderness present. A middle ear effusion is present. No mastoid tenderness.     Left Ear: Hearing normal. Swelling and tenderness present. A middle ear effusion (Lt > Rt) is present. No mastoid tenderness. Tympanic membrane is bulging.  Lymphadenopathy:     Cervical: No cervical adenopathy.  Neurological:     General: No focal deficit present.     Mental Status: She is alert and oriented to person, place, and time.      A/P:  1. Need for HPV vaccination - HPV 9-valent  vaccine,Recombinat - RTO in 2 mo for #2 - RN visit  2. Acute otitis externa of both ears, unspecified type - also ? AOM on Lt but pt declines oral abx at this time and would like to see if symptoms resolve with gtts. Pt is breastfeeding her 48mo old daughter Rx: - ofloxacin (FLOXIN OTIC) 0.3 % OTIC solution; Place 5 drops into both ears 2 (two) times daily for 7 days.  Dispense:  5 mL; Refill: 0 - f/u if symptoms worsen or do not improve Discussed plan and reviewed medications with patient, including risks, benefits, and potential side effects. Pt expressed understand. All questions answered.  3. Skin lesion of back - punch biopsy done by Dr. Lorelei Pont in 05/2020 - pathology benign    This visit occurred during the SARS-CoV-2 public health emergency.  Safety protocols were in place, including screening questions prior to the visit, additional usage of staff PPE, and extensive cleaning of exam room while observing appropriate contact time as indicated for disinfecting solutions.

## 2020-07-20 ENCOUNTER — Encounter: Payer: Self-pay | Admitting: Family Medicine

## 2020-07-20 ENCOUNTER — Telehealth (INDEPENDENT_AMBULATORY_CARE_PROVIDER_SITE_OTHER): Payer: BC Managed Care – PPO | Admitting: Family Medicine

## 2020-07-20 VITALS — Ht 69.0 in | Wt 145.0 lb

## 2020-07-20 DIAGNOSIS — F418 Other specified anxiety disorders: Secondary | ICD-10-CM

## 2020-07-20 DIAGNOSIS — R5383 Other fatigue: Secondary | ICD-10-CM

## 2020-07-20 DIAGNOSIS — H60313 Diffuse otitis externa, bilateral: Secondary | ICD-10-CM

## 2020-07-20 DIAGNOSIS — H66003 Acute suppurative otitis media without spontaneous rupture of ear drum, bilateral: Secondary | ICD-10-CM

## 2020-07-20 MED ORDER — CIPROFLOXACIN-DEXAMETHASONE 0.3-0.1 % OT SUSP: 4.0000 [drp] | Freq: Two times a day (BID) | OTIC | 0 refills | Status: AC

## 2020-07-20 MED ORDER — AMOXICILLIN-POT CLAVULANATE 875-125 MG PO TABS: 1.0000 | ORAL_TABLET | Freq: Two times a day (BID) | ORAL | 0 refills | Status: DC

## 2020-07-20 MED ORDER — BUPROPION HCL ER (XL) 150 MG PO TB24: 150.0000 mg | ORAL_TABLET | Freq: Every day | ORAL | 3 refills | Status: DC

## 2020-07-20 NOTE — Progress Notes (Signed)
Virtual Visit via Video Note  I connected with Tracy Gallegos on 07/20/20 at  3:00 PM EDT by a video enabled telemedicine application and verified that I am speaking with the correct person using two identifiers. Location patient: home Location provider: work or home office Persons participating in the virtual visit: patient, provider, pts husband Tracy Gallegos  I discussed the limitations of evaluation and management by telemedicine and the availability of in person appointments. The patient expressed understanding and agreed to proceed.  Chief Complaint  Patient presents with  . Follow-up    ear fulliness, fatigue and wants to discuss going back on Adderall or Welbutrin     HPI: Tracy Gallegos is a 34 y.o. female   1. Pt was seen by me on 07/05/20 for B/L ear pain and was Rx'd floxin otic for B/L AOE. Symptoms worse now. She used gtts x 5 days.  2. H/o fatigue, anxiety. She was on Adderall in the past but has not been on this due to recent pregnancy and now breastfeeding.  She was provigil in the past then was switched to adderall. She was also tried on wellbutrin (?75mg ) but took for a few weeks and then stopped d/t getting pregnant. Pt would like to resume this to see if it helps with fatigue and feeling on edge. She feels exhausted, even on nights when she does get sleep. Pt notes a "documented sleep disorder" and husband states she had a sleep study which showed she doesn't go thru all stages of sleep.pt is nursing but working to wean.  Past Medical History:  Diagnosis Date  . Depression   . Endometriosis   . Gestational diabetes    metformin  . History of gestational diabetes 01/23/2018  . Ovarian cyst    Bilateral    Past Surgical History:  Procedure Laterality Date  . OVARIAN CYST SURGERY    . WISDOM TOOTH EXTRACTION      Family History  Problem Relation Age of Onset  . Obesity Mother   . Depression Mother   . Alcohol abuse Mother   . Heart disease Father   .  Early death Father   . Obesity Brother   . Alcohol abuse Maternal Uncle   . Depression Maternal Uncle   . Hypertension Maternal Grandmother   . Depression Maternal Grandmother   . Alcohol abuse Maternal Grandmother   . Alzheimer's disease Maternal Grandmother   . Depression Maternal Grandfather   . Stroke Maternal Grandfather   . Heart disease Maternal Grandfather   . Suicidality Maternal Grandfather   . Suicidality Paternal Grandmother   . Depression Paternal Grandmother   . Heart disease Paternal Grandfather   . Early death Paternal Grandfather     Social History   Tobacco Use  . Smoking status: Never Smoker  . Smokeless tobacco: Never Used  Vaping Use  . Vaping Use: Never used  Substance Use Topics  . Alcohol use: No  . Drug use: No     Current Outpatient Medications:  .  FLUoxetine (PROZAC) 20 MG capsule, Take 2 capsules (40 mg total) by mouth daily. TAKE 2 CAPSULES BY MOUTH ONCE A DAY, Disp: 180 capsule, Rfl: 3 .  ibuprofen (ADVIL) 600 MG tablet, Take 1 tablet (600 mg total) by mouth every 6 (six) hours., Disp: 30 tablet, Rfl: 0 .  Prenatal Vit-Fe Fumarate-FA (PRENATAL MULTIVITAMIN) TABS tablet, Take 1 tablet by mouth daily at 12 noon., Disp: , Rfl:  .  Prenatal-DSS-FeCb-FeGl-FA (CITRANATAL BLOOM) 90-1 MG TABS, ,  Disp: , Rfl:  .  buPROPion (WELLBUTRIN SR) 150 MG 12 hr tablet, Take 150 mg by mouth 2 (two) times daily. (Patient not taking: Reported on 07/05/2020), Disp: , Rfl:  .  busPIRone (BUSPAR) 7.5 MG tablet, Take 1 tablet (7.5 mg total) by mouth 3 (three) times daily. Needs appt with pcp for additional refills (Patient not taking: Reported on 03/02/2020), Disp: 90 tablet, Rfl: 0 .  desoximetasone (TOPICORT) 0.25 % cream, Apply 1 application topically 2 (two) times daily. (Patient not taking: Reported on 07/05/2020), Disp: 30 g, Rfl: 0 .  methylergonovine (METHERGINE) 0.2 MG tablet, , Disp: , Rfl:   Allergies  Allergen Reactions  . Nitrous Oxide Nausea And Vomiting     Patient would like to try during labor. States it has been 20 years since she experienced this.      ROS: See pertinent positives and negatives per HPI.   EXAM:  VITALS per patient if applicable: Ht 5\' 9"  (1.753 m)   Wt 145 lb (65.8 kg) Comment: pt reported  BMI 21.41 kg/m    GENERAL: alert, oriented, appears well and in no acute distress  HEENT: atraumatic, conjunctiva clear, no obvious abnormalities on inspection of external nose and ears  NECK: normal movements of the head and neck  LUNGS: on inspection no signs of respiratory distress, breathing rate appears normal, no obvious gross SOB, gasping or wheezing, no conversational dyspnea  CV: no obvious cyanosis  PSYCH/NEURO: pleasant and cooperative, speech and thought processing grossly intact   ASSESSMENT AND PLAN: 1. Acute diffuse otitis externa of both ears 2. Non-recurrent acute suppurative otitis media of both ears without spontaneous rupture of tympanic membranes Rx: - amoxicillin-clavulanate (AUGMENTIN) 875-125 MG tablet; Take 1 tablet by mouth 2 (two) times daily.  Dispense: 20 tablet; Refill: 0 - ciprofloxacin-dexamethasone (CIPRODEX) OTIC suspension; Place 4 drops into both ears 2 (two) times daily for 7 days.  Dispense: 7.5 mL; Refill: 0 - if no/minimal improvement in 7-10 days, pt is to f/u  3. Depression with anxiety 4. Fatigue, unspecified type Restart: - buPROPion (WELLBUTRIN XL) 150 MG 24 hr tablet; Take 1 tablet (150 mg total) by mouth daily.  Dispense: 90 tablet; Refill: 3 - pt prefers XL as it improves med compliance - CBC; Future - Iron, TIBC and Ferritin Panel; Future - TSH; Future - VITAMIN D 25 Hydroxy (Vit-D Deficiency, Fractures); Future - Vitamin B12; Future - f/u in 3-4 wks or sooner PRN Discussed plan and reviewed medications with patient, including risks, benefits, and potential side effects. Pt expressed understand. All questions answered.   I discussed the assessment and treatment  plan with the patient. The patient was provided an opportunity to ask questions and all were answered. The patient agreed with the plan and demonstrated an understanding of the instructions.   The patient was advised to call back or seek an in-person evaluation if the symptoms worsen or if the condition fails to improve as anticipated.   [name], DO

## 2020-07-24 ENCOUNTER — Telehealth: Payer: Self-pay | Admitting: Family Medicine

## 2020-07-24 NOTE — Telephone Encounter (Signed)
Patient called after hours nurse line stating that she needed to be seen for a double ear infection. Has been on antibiotics for 2 days, but it's getting worse. Called patient to get her schedule, no answer and mailbox is full.

## 2020-07-26 ENCOUNTER — Encounter: Payer: Self-pay | Admitting: Family Medicine

## 2020-08-16 ENCOUNTER — Telehealth: Payer: BC Managed Care – PPO | Admitting: Family Medicine

## 2020-09-29 ENCOUNTER — Telehealth (INDEPENDENT_AMBULATORY_CARE_PROVIDER_SITE_OTHER): Payer: BC Managed Care – PPO | Admitting: Family Medicine

## 2020-09-29 ENCOUNTER — Encounter: Payer: Self-pay | Admitting: Family Medicine

## 2020-09-29 ENCOUNTER — Ambulatory Visit: Payer: BC Managed Care – PPO | Admitting: Family Medicine

## 2020-09-29 VITALS — Ht 69.0 in | Wt 135.0 lb

## 2020-09-29 DIAGNOSIS — F418 Other specified anxiety disorders: Secondary | ICD-10-CM | POA: Diagnosis not present

## 2020-09-29 DIAGNOSIS — G479 Sleep disorder, unspecified: Secondary | ICD-10-CM | POA: Diagnosis not present

## 2020-09-29 DIAGNOSIS — R5383 Other fatigue: Secondary | ICD-10-CM | POA: Diagnosis not present

## 2020-09-29 MED ORDER — AMPHETAMINE-DEXTROAMPHETAMINE 5 MG PO TABS: 5.0000 mg | ORAL_TABLET | Freq: Every day | ORAL | 0 refills | Status: DC

## 2020-09-29 MED ORDER — BUPROPION HCL ER (XL) 150 MG PO TB24: 150.0000 mg | ORAL_TABLET | Freq: Every day | ORAL | 1 refills | Status: DC

## 2020-09-29 NOTE — Progress Notes (Signed)
Virtual Visit via Video Note  I connected with Tracy Gallegos on 09/29/20 at  3:30 PM EST by a video enabled telemedicine application and verified that I am speaking with the correct person using two identifiers. Location patient: home Location provider: work or home office Persons participating in the virtual visit: patient, provider  I discussed the limitations of evaluation and management by telemedicine and the availability of in person appointments. The patient expressed understanding and agreed to proceed.  Chief Complaint  Patient presents with  . Follow-up    f/u meds.  discuss daytime sleepiness/anxiety.  wants to restart Adderall.     HPI: Tracy Gallegos is a 34 y.o. female who is seen today for f/u on anxiety, fatigue. Pt is on prozac 40mg  daily and wellbutrin 300 mg daily. She feels the addition of the wellbutrin especially at 300mg  has helped with her anxiety and feeling less tired. But she does not feel it is nearly as effective as Adderall was in the past. She thinks she is more irritable and impatient on the wellbutrin.   Dr. Barnie Mort - sleep medicine - switched pt from provigil to adderall. She has been on/off adderall (off while pregnant or breastfeeding 3 children). She is not able to tolerate long-acting med. She was on adderall 5mg  daily and 10mg  daily. She feels   Depression screen [facility] 2/9 09/29/2020 03/02/2020 06/07/2019  Decreased Interest 3 0 2  Down, Depressed, Hopeless 3 0 2  PHQ - 2 Score 6 0 4  Altered sleeping 2 - 1  Tired, decreased energy 3 - 3  Change in appetite 0 - 0  Feeling bad or failure about yourself  3 - 2  Trouble concentrating 3 - 3  Moving slowly or fidgety/restless 0 - 0  Suicidal thoughts 0 - 0  PHQ-9 Score 17 - 13  Difficult doing work/chores Extremely dIfficult - Extremely dIfficult   GAD 7 : Generalized Anxiety Score 09/29/2020 06/07/2019 08/26/2018 06/30/2018  Nervous, Anxious, on Edge 3 3 2 1   Control/stop worrying 3 2  2 1   Worry too much - different things 3 2 2 1   Trouble relaxing 3 0 1 2  Restless 0 0 0 0  Easily annoyed or irritable 3 2 3 2   Afraid - awful might happen 3 1 1  0  Total GAD 7 Score 18 10 11 7   Anxiety Difficulty Extremely difficult Very difficult Very difficult -      Past Medical History:  Diagnosis Date  . Depression   . Endometriosis   . Gestational diabetes    metformin  . History of gestational diabetes 01/23/2018  . Ovarian cyst    Bilateral    Past Surgical History:  Procedure Laterality Date  . OVARIAN CYST SURGERY    . WISDOM TOOTH EXTRACTION      Family History  Problem Relation Age of Onset  . Obesity Mother   . Depression Mother   . Alcohol abuse Mother   . Heart disease Father   . Early death Father   . Obesity Brother   . Alcohol abuse Maternal Uncle   . Depression Maternal Uncle   . Hypertension Maternal Grandmother   . Depression Maternal Grandmother   . Alcohol abuse Maternal Grandmother   . Alzheimer's disease Maternal Grandmother   . Depression Maternal Grandfather   . Stroke Maternal Grandfather   . Heart disease Maternal Grandfather   . Suicidality Maternal Grandfather   . Suicidality Paternal Grandmother   . Depression  Paternal Grandmother   . Heart disease Paternal Grandfather   . Early death Paternal Grandfather     Social History   Tobacco Use  . Smoking status: Never Smoker  . Smokeless tobacco: Never Used  Vaping Use  . Vaping Use: Never used  Substance Use Topics  . Alcohol use: No  . Drug use: No     Current Outpatient Medications:  .  buPROPion (WELLBUTRIN XL) 150 MG 24 hr tablet, Take 1 tablet (150 mg total) by mouth daily., Disp: 30 tablet, Rfl: 1 .  FLUoxetine (PROZAC) 20 MG capsule, Take 2 capsules (40 mg total) by mouth daily. TAKE 2 CAPSULES BY MOUTH ONCE A DAY, Disp: 180 capsule, Rfl: 3 .  amphetamine-dextroamphetamine (ADDERALL) 5 MG tablet, Take 1 tablet (5 mg total) by mouth daily., Disp: 30 tablet, Rfl:  0 .  Prenatal Vit-Fe Fumarate-FA (PRENATAL MULTIVITAMIN) TABS tablet, Take 1 tablet by mouth daily at 12 noon. (Patient not taking: Reported on 09/29/2020), Disp: , Rfl:  .  Prenatal-DSS-FeCb-FeGl-FA (CITRANATAL BLOOM) 90-1 MG TABS, , Disp: , Rfl:   Allergies  Allergen Reactions  . Nitrous Oxide Nausea And Vomiting    Patient would like to try during labor. States it has been 20 years since she experienced this.      ROS: See pertinent positives and negatives per HPI.   EXAM:  VITALS per patient if applicable: Ht 5\' 9"  (1.753 m)   Wt 135 lb (61.2 kg) Comment: pt reported  BMI 19.94 kg/m    GENERAL: alert, oriented, appears well and in no acute distress  NECK: normal movements of the head and neck  LUNGS: on inspection no signs of respiratory distress, breathing rate appears normal, no obvious gross SOB, gasping or wheezing, no conversational dyspnea  CV: no obvious cyanosis  PSYCH/NEURO: pleasant and cooperative, speech and thought processing grossly intact   ASSESSMENT AND PLAN: A/P:  1. Depression with anxiety 2. Sleep disorder, unspecified 3. Fatigue, unspecified type - cont prozac 40mg  daily Rx: - amphetamine-dextroamphetamine (ADDERALL) 5 MG tablet; Take 1 tablet (5 mg total) by mouth daily.  Dispense: 30 tablet; Refill: 0 Decrease: - buPROPion (WELLBUTRIN XL) 150 MG 24 hr tablet; Take 1 tablet (150 mg total) by mouth daily.  Dispense: 30 tablet; Refill: 1 - plan to taper off after 150mg  x 2 wks - database reviewed and appropriate - f/u in 3-4 wks or sooner PRN   I discussed the assessment and treatment plan with the patient. The patient was provided an opportunity to ask questions and all were answered. The patient agreed with the plan and demonstrated an understanding of the instructions.   The patient was advised to call back or seek an in-person evaluation if the symptoms worsen or if the condition fails to improve as anticipated.   [name], DO

## 2020-10-28 ENCOUNTER — Encounter: Payer: Self-pay | Admitting: Family Medicine

## 2020-10-28 DIAGNOSIS — O99342 Other mental disorders complicating pregnancy, second trimester: Secondary | ICD-10-CM

## 2020-10-28 DIAGNOSIS — F418 Other specified anxiety disorders: Secondary | ICD-10-CM

## 2020-10-28 DIAGNOSIS — R5383 Other fatigue: Secondary | ICD-10-CM

## 2020-10-28 DIAGNOSIS — F32A Depression, unspecified: Secondary | ICD-10-CM

## 2020-10-28 DIAGNOSIS — G479 Sleep disorder, unspecified: Secondary | ICD-10-CM

## 2020-11-01 MED ORDER — FLUOXETINE HCL 20 MG PO CAPS: 60.0000 mg | ORAL_CAPSULE | Freq: Every day | ORAL | 3 refills | Status: DC

## 2020-11-01 MED FILL — FLUoxetine HCL 20 MG CAPS: 20 | 90 days supply | Qty: 270 | Fill #0

## 2020-11-29 MED ORDER — AMPHETAMINE-DEXTROAMPHETAMINE 10 MG PO TABS: 10.0000 mg | ORAL_TABLET | Freq: Every day | ORAL | 0 refills | Status: DC

## 2020-11-30 MED ORDER — AMPHETAMINE-DEXTROAMPHETAMINE 10 MG PO TABS: 10.0000 mg | ORAL_TABLET | Freq: Every day | ORAL | 0 refills | Status: DC

## 2021-01-21 ENCOUNTER — Encounter: Payer: Self-pay | Admitting: Family Medicine

## 2021-01-21 DIAGNOSIS — F418 Other specified anxiety disorders: Secondary | ICD-10-CM

## 2021-01-21 DIAGNOSIS — F32A Depression, unspecified: Secondary | ICD-10-CM

## 2021-01-21 DIAGNOSIS — R5383 Other fatigue: Secondary | ICD-10-CM

## 2021-01-21 DIAGNOSIS — G479 Sleep disorder, unspecified: Secondary | ICD-10-CM

## 2021-01-21 DIAGNOSIS — O99342 Other mental disorders complicating pregnancy, second trimester: Secondary | ICD-10-CM

## 2021-01-22 NOTE — Telephone Encounter (Signed)
Please review and advise on message below. Thanks.  Dm/cma

## 2021-01-24 MED ORDER — FLUOXETINE HCL 20 MG PO CAPS: 60.0000 mg | ORAL_CAPSULE | Freq: Every day | ORAL | 3 refills | Status: DC

## 2021-01-24 MED ORDER — AMPHETAMINE-DEXTROAMPHETAMINE 10 MG PO TABS: 10.0000 mg | ORAL_TABLET | Freq: Every day | ORAL | 0 refills | Status: DC

## 2021-02-01 ENCOUNTER — Encounter: Payer: Self-pay | Admitting: Family Medicine

## 2021-02-01 ENCOUNTER — Telehealth: Payer: Self-pay | Admitting: Family Medicine

## 2021-02-01 DIAGNOSIS — R002 Palpitations: Secondary | ICD-10-CM

## 2021-02-01 NOTE — Telephone Encounter (Signed)
Patient called to schedule an appointment for heart palpitations. States that she's had them for about a week and they keep increasing. Informed patient that I would need to transfer her to have her further triaged. She still wanted to schedule with the provider. I told her the next appointment with her provider would be next Thursday, but could get her in sooner with another provider. She declined and said that she only wanted to see Dr. Bryan Lemma. Scheduled her and noted the appointment desk, then transferred her to the triage nurse.

## 2021-02-01 NOTE — Telephone Encounter (Signed)
Pt called in informing me that the nurse triage had told her she needed to be seen within three days. I offered her an appointment with Dr. Gena Fray on 02/02/21 at 2:30, she declined and kept her upcoming appointment with Dr. Bryan Lemma on 02/08/21.

## 2021-02-01 NOTE — Telephone Encounter (Signed)
error 

## 2021-02-05 ENCOUNTER — Telehealth: Payer: Self-pay | Admitting: Family Medicine

## 2021-02-05 NOTE — Telephone Encounter (Signed)
This has been sent to Dr. Loletha Grayer in a TE.

## 2021-02-05 NOTE — Telephone Encounter (Signed)
Please see message and advise.  Thank you. ° °

## 2021-02-05 NOTE — Telephone Encounter (Signed)
What is the name of the medication? Zio Patch Monitor  Have you contacted your pharmacy to request a refill? She is under the impression she was to get this on Friday 02/02/21.  Which pharmacy would you like this sent to? [facility] in [place] or [facility] [address]   Patient notified that their request is being sent to the clinical staff for review and that they should receive a call once it is complete. If they do not receive a call within 72 hours they can check with their pharmacy or our office.

## 2021-02-06 ENCOUNTER — Other Ambulatory Visit: Payer: Self-pay | Admitting: Family Medicine

## 2021-02-06 ENCOUNTER — Ambulatory Visit (INDEPENDENT_AMBULATORY_CARE_PROVIDER_SITE_OTHER): Payer: BC Managed Care – PPO

## 2021-02-06 ENCOUNTER — Telehealth: Payer: Self-pay | Admitting: Cardiology

## 2021-02-06 DIAGNOSIS — R002 Palpitations: Secondary | ICD-10-CM

## 2021-02-06 NOTE — Telephone Encounter (Signed)
Message from Dr. Loletha Grayer sent via [employer] to patient.

## 2021-02-06 NOTE — Telephone Encounter (Signed)
Received telephone call from patient in regards to the attached patient message. Appointment has been made for 03/01/2021 with Dr. Domenic Polite.  Dr. Bryan Lemma informed me that, the Referral for zio patch was placed on fri 3/25. This is a monitor and not from a drug store. [facility] is where the referral/order was sent so they should have called you or you can call them.   Please let me know if this has helped.  Dr. Bryan Lemma will be in the office tomorrow if you have any further questions.  Brendell L Tyus, CMA

## 2021-02-06 NOTE — Telephone Encounter (Signed)
Patient is calling back to check the status of her previous message. Please call her back at [phone].

## 2021-02-06 NOTE — Telephone Encounter (Signed)
Patient advised that 3 day ZIO XT patch enrollment completed and that she will get this monitor in the mail. Advised that [employer] phone number will be sent to her mychart account for reference. Patient was very grateful for the call and update.

## 2021-02-06 NOTE — Telephone Encounter (Signed)
Referral for zio patch was placed on fri 3/25. This is a monitor and not from a drug store. [facility] is where the referral/order was sent so they should call pt or she can call them

## 2021-02-08 ENCOUNTER — Encounter: Payer: Self-pay | Admitting: Family Medicine

## 2021-02-08 ENCOUNTER — Telehealth (INDEPENDENT_AMBULATORY_CARE_PROVIDER_SITE_OTHER): Payer: BC Managed Care – PPO | Admitting: Family Medicine

## 2021-02-08 DIAGNOSIS — L659 Nonscarring hair loss, unspecified: Secondary | ICD-10-CM

## 2021-02-08 DIAGNOSIS — Z30011 Encounter for initial prescription of contraceptive pills: Secondary | ICD-10-CM | POA: Diagnosis not present

## 2021-02-08 DIAGNOSIS — L989 Disorder of the skin and subcutaneous tissue, unspecified: Secondary | ICD-10-CM

## 2021-02-08 MED ORDER — NORETHINDRONE ACET-ETHINYL EST 1-20 MG-MCG PO TABS: 1.0000 | ORAL_TABLET | Freq: Every day | ORAL | 3 refills | Status: DC

## 2021-02-08 MED ORDER — SPIRONOLACTONE 50 MG PO TABS: ORAL_TABLET | ORAL | 1 refills | Status: DC

## 2021-02-08 NOTE — Telephone Encounter (Signed)
I spoke with pt and she said that she did not need to come unless PCP thinks it's that she should.  Pt said that she would like to discuss [facility], and get providers thoughts on taking it now or after seeing the cardiologist.  Pt's appt w/cardio is the end of April.  Pt explained that Dr. Lilian Coma call her or send her a message on her thought about pt starting [facility] or can have a virtual visit.  I inofrmed her that I would send Dr. Loletha Grayer the message and get back with her.  Please advise.

## 2021-02-08 NOTE — Progress Notes (Signed)
Virtual Visit via Video Note  I connected with Tracy Gallegos on 02/08/21 at  2:30 PM EDT by a video enabled telemedicine application and verified that I am speaking with the correct person using two identifiers. Location patient: home Location provider: work Persons participating in the virtual visit: patient, provider  I discussed the limitations of evaluation and management by telemedicine and the availability of in person appointments. The patient expressed understanding and agreed to proceed.  Chief Complaint  Patient presents with  . Contraception    Cardiovascular issues mole on back and, contraceptives     HPI: Tracy Gallegos is a 35 y.o. female seen today to discuss birth control options. She is married with 3 young children.  Pt was on OCP in the past prior to having children. She is having periods and is no longer breastfeeding.   A mole on her Rt upper back/posterior shoulder that was removed last year has returned. Pt would like to have it removed and will go to husband's office to have it done.  Hair loss/thinning, not new. Mom has same issue. Would like to try spironolactone.   Palpitations and "dropped beats" on EKG. zio patch 3 day ordered thru [place] at [facility]. Cardio appt in 3wks. Labs done - CBC, CMP, TSH, ? Others - all still pending  Past Medical History:  Diagnosis Date  . Depression   . Endometriosis   . Gestational diabetes    metformin  . History of gestational diabetes 01/23/2018  . Ovarian cyst    Bilateral    Past Surgical History:  Procedure Laterality Date  . OVARIAN CYST SURGERY    . WISDOM TOOTH EXTRACTION      Family History  Problem Relation Age of Onset  . Obesity Mother   . Depression Mother   . Alcohol abuse Mother   . Heart disease Father   . Early death Father   . Obesity Brother   . Alcohol abuse Maternal Uncle   . Depression Maternal Uncle   . Hypertension Maternal Grandmother   . Depression Maternal  Grandmother   . Alcohol abuse Maternal Grandmother   . Alzheimer's disease Maternal Grandmother   . Depression Maternal Grandfather   . Stroke Maternal Grandfather   . Heart disease Maternal Grandfather   . Suicidality Maternal Grandfather   . Suicidality Paternal Grandmother   . Depression Paternal Grandmother   . Heart disease Paternal Grandfather   . Early death Paternal Grandfather     Social History   Tobacco Use  . Smoking status: Never Smoker  . Smokeless tobacco: Never Used  Vaping Use  . Vaping Use: Never used  Substance Use Topics  . Alcohol use: No  . Drug use: No     Current Outpatient Medications:  .  FLUoxetine (PROZAC) 20 MG capsule, Take 3 capsules (60 mg total) by mouth daily., Disp: 270 capsule, Rfl: 3 .  norethindrone-ethinyl estradiol (LOESTRIN) 1-20 MG-MCG tablet, Take 1 tablet by mouth daily., Disp: 84 tablet, Rfl: 3  No Known Allergies    ROS: See pertinent positives and negatives per HPI.   EXAM:  VITALS per patient if applicable: There were no vitals taken for this visit.   GENERAL: alert, oriented, appears well and in no acute distress  HEENT: atraumatic, hair is thinning on top of head and also hairline near temples, no obvious abnormalities on inspection of external nose and ears  NECK: normal movements of the head and neck  LUNGS: on inspection no  signs of respiratory distress, breathing rate appears normal, no obvious gross SOB, gasping or wheezing, no conversational dyspnea  CV: no obvious cyanosis  SKIN: unable to assess virtually  PSYCH/NEURO: pleasant and cooperative, speech and thought processing grossly intact   ASSESSMENT AND PLAN: 1. BCP (birth control pills) initiation Rx: - norethindrone-ethinyl estradiol (LOESTRIN) 1-20 MG-MCG tablet; Take 1 tablet by mouth daily.  Dispense: 84 tablet; Refill: 3  2. Hair thinning - not a new issue and mother with same issue. Pt has used topical minoxidil in the past - CBC, TSH  pending - since pt is pre-menopausal, will start with spironolactone (goal of 100mg  daily) and can consider addition of minoxidil in 4-109mo if no noticeable improvement Rx: - spironolactone (ALDACTONE) 50 MG tablet; 1 tab po daily x 1-2wks then increase to 2 tabs (100mg ) po daily  Dispense: 180 tablet; Refill: 1  3. Skin lesion of back - previous removed with 26mm punch biopsy by Dr. Lorelei Pont but pt states it has returned and appears irregular, darker in color, irritated - husband is physician and provider/colleague in office is able to remove this so pt will schedule appt there    I discussed the assessment and treatment plan with the patient. The patient was provided an opportunity to ask questions and all were answered. The patient agreed with the plan and demonstrated an understanding of the instructions.   The patient was advised to call back or seek an in-person evaluation if the symptoms worsen or if the condition fails to improve as anticipated.   Tracy Median, DO

## 2021-02-08 NOTE — Telephone Encounter (Signed)
Let's switch appt to VV and can discuss birth control options at that appt

## 2021-02-09 NOTE — Telephone Encounter (Signed)
Pt's appointment switched to virtual.

## 2021-02-14 DIAGNOSIS — R002 Palpitations: Secondary | ICD-10-CM

## 2021-02-25 ENCOUNTER — Encounter: Payer: Self-pay | Admitting: Family Medicine

## 2021-02-26 NOTE — Telephone Encounter (Signed)
Please see message and advise.  Thank you. ° °

## 2021-02-28 ENCOUNTER — Encounter: Payer: Self-pay | Admitting: Cardiology

## 2021-02-28 NOTE — Progress Notes (Deleted)
Cardiology Office Note  Date: 02/28/2021   ID: Tracy Gallegos, DOB 03/07/1986, MRN [id number]  PCP:  Ronnald Nian, DO  Cardiologist:  No primary care provider on file. Electrophysiologist:  None   No chief complaint on file.   History of Present Illness: Tracy Gallegos is a 35 y.o. female referred for cardiology consultation by Dr. Bryan Lemma for the evaluation of palpitations.  I reviewed the ECG captures from her Apple Watch on March 28 showing sinus rhythm with PVC.  There is also a blurry copy of an ECG from March 24 showing probable sinus rhythm with lead loss in lead V6.  Zio patch was ordered by PCP.  Past Medical History:  Diagnosis Date  . Depression   . Endometriosis   . History of gestational diabetes 01/23/2018  . Ovarian cyst    Bilateral    Past Surgical History:  Procedure Laterality Date  . OVARIAN CYST SURGERY    . WISDOM TOOTH EXTRACTION      Current Outpatient Medications  Medication Sig Dispense Refill  . FLUoxetine (PROZAC) 20 MG capsule Take 3 capsules (60 mg total) by mouth daily. 270 capsule 3  . norethindrone-ethinyl estradiol (LOESTRIN) 1-20 MG-MCG tablet Take 1 tablet by mouth daily. 84 tablet 3  . spironolactone (ALDACTONE) 50 MG tablet 1 tab po daily x 1-2wks then increase to 2 tabs (100mg ) po daily 180 tablet 1   No current facility-administered medications for this visit.   Allergies:  Patient has no known allergies.   Social History: The patient  reports that she has never smoked. She has never used smokeless tobacco. She reports that she does not drink alcohol and does not use drugs.   Family History: The patient's family history includes Alcohol abuse in her maternal grandmother, maternal uncle, and mother; Alzheimer's disease in her maternal grandmother; Depression in her maternal grandfather, maternal grandmother, maternal uncle, mother, and paternal grandmother; Early death in her father and paternal grandfather;  Heart disease in her father, maternal grandfather, and paternal grandfather; Hypertension in her maternal grandmother; Obesity in her brother and mother; Stroke in her maternal grandfather; Suicidality in her maternal grandfather and paternal grandmother.   ROS:  Please see the history of present illness. Otherwise, complete review of systems is positive for {NONE DEFAULTED:18576::"none"}.  All other systems are reviewed and negative.   Physical Exam: VS:  There were no vitals taken for this visit., BMI There is no height or weight on file to calculate BMI.  Wt Readings from Last 3 Encounters:  09/29/20 135 lb (61.2 kg)  07/20/20 145 lb (65.8 kg)  07/05/20 140 lb 3.2 oz (63.6 kg)    General: Patient appears comfortable at rest. HEENT: Conjunctiva and lids normal, oropharynx clear with moist mucosa. Neck: Supple, no elevated JVP or carotid bruits, no thyromegaly. Lungs: Clear to auscultation, nonlabored breathing at rest. Cardiac: Regular rate and rhythm, no S3 or significant systolic murmur, no pericardial rub. Abdomen: Soft, nontender, no hepatomegaly, bowel sounds present, no guarding or rebound. Extremities: No pitting edema, distal pulses 2+. Skin: Warm and dry. Musculoskeletal: No kyphosis. Neuropsychiatric: Alert and oriented x3, affect grossly appropriate.  ECG:  No old tracings for review.  Recent Labwork:  No recent lab work for review.  Other Studies Reviewed Today:  No previous cardiac testing for review.  Assessment and Plan:    Medication Adjustments/Labs and Tests Ordered: Current medicines are reviewed at length with the patient today.  Concerns regarding medicines are outlined above.  Tests Ordered: No orders of the defined types were placed in this encounter.   Medication Changes: No orders of the defined types were placed in this encounter.   Disposition:  Follow up {follow up:15908}  Signed, Satira Sark, MD, [facility] 02/28/2021 8:05 PM     [facility] [address] [place], [place], [place] [address] Phone: [phone]; Fax: [phone]

## 2021-03-01 ENCOUNTER — Ambulatory Visit: Payer: BC Managed Care – PPO | Admitting: Cardiology

## 2021-03-01 DIAGNOSIS — R002 Palpitations: Secondary | ICD-10-CM

## 2021-03-31 ENCOUNTER — Encounter: Payer: Self-pay | Admitting: Family Medicine

## 2021-03-31 ENCOUNTER — Other Ambulatory Visit: Payer: Self-pay | Admitting: Family Medicine

## 2021-03-31 DIAGNOSIS — R5383 Other fatigue: Secondary | ICD-10-CM

## 2021-03-31 DIAGNOSIS — F418 Other specified anxiety disorders: Secondary | ICD-10-CM

## 2021-03-31 DIAGNOSIS — G479 Sleep disorder, unspecified: Secondary | ICD-10-CM

## 2021-04-02 NOTE — Telephone Encounter (Signed)
Rx refilled. Please tell pt I'm happy to see her on 6/3 and discuss medications options

## 2021-04-02 NOTE — Telephone Encounter (Signed)
Please see message and advise.  Thank you. ° °

## 2021-04-13 ENCOUNTER — Telehealth (INDEPENDENT_AMBULATORY_CARE_PROVIDER_SITE_OTHER): Payer: BC Managed Care – PPO | Admitting: Family Medicine

## 2021-04-13 ENCOUNTER — Encounter: Payer: Self-pay | Admitting: Family Medicine

## 2021-04-13 DIAGNOSIS — F418 Other specified anxiety disorders: Secondary | ICD-10-CM | POA: Diagnosis not present

## 2021-04-13 MED ORDER — VIIBRYD 20 MG PO TABS: 20.0000 mg | ORAL_TABLET | Freq: Every day | ORAL | 2 refills | Status: DC

## 2021-04-13 NOTE — Progress Notes (Signed)
Virtual Visit via Video Note  I connected with Tracy Gallegos on 04/13/21 at  3:30 PM EDT by a video enabled telemedicine application and verified that I am speaking with the correct person using two identifiers. Location patient: home Location provider: work  Persons participating in the virtual visit: patient, provider  I discussed the limitations of evaluation and management by telemedicine and the availability of in person appointments. The patient expressed understanding and agreed to proceed.  Chief Complaint  Patient presents with  . Follow-up    F/u medications and discuss med changes.     HPI: Tracy Gallegos is a 35 y.o. female patient seen today for f/u on depression and anxiety.  She is currently taking prozac 60mg  daily which is not effective at managing her symptoms.  She was on buspar in the recent past but this made her drowsy/tired. She was also on wellbutrin 150-300mg  daily but this med made her on edge. believes she was on fluvoxamine in the past as well.   She is taking adderall 10mg  daily.  Son diagnosed with autism, developmental delay about 3 wks ago. Pt is having a very hard time accepting this. Son has PT, OT, speech therapy.  She is having some marital issues, in marital counseling.   Previous therapist - Tracy Gallegos with Tracy Gallegos - pt was seeing her prior to covid. Pt does not feel telemedicine was helpful.   Depression screen Tracy Gallegos 2/9 04/13/2021 09/29/2020 03/02/2020  Decreased Interest 2 3 0  Down, Depressed, Hopeless 2 3 0  PHQ - 2 Score 4 6 0  Altered sleeping 1 2 -  Tired, decreased energy 3 3 -  Change in appetite 0 0 -  Feeling bad or failure about yourself  3 3 -  Trouble concentrating 3 3 -  Moving slowly or fidgety/restless 0 0 -  Suicidal thoughts 0 0 -  PHQ-9 Score 14 17 -  Difficult doing work/chores Very difficult Extremely dIfficult -   GAD 7 : Generalized Anxiety Score 04/13/2021 09/29/2020 06/07/2019 08/26/2018   Nervous, Anxious, on Edge 3 3 3 2   Control/stop worrying 3 3 2 2   Worry too much - different things 3 3 2 2   Trouble relaxing 3 3 0 1  Restless 0 0 0 0  Easily annoyed or irritable 3 3 2 3   Afraid - awful might happen 2 3 1 1   Total GAD 7 Score 17 18 10 11   Anxiety Difficulty Very difficult Extremely difficult Very difficult Very difficult      Past Medical History:  Diagnosis Date  . Depression   . Endometriosis   . History of gestational diabetes 01/23/2018  . Ovarian cyst    Bilateral    Past Surgical History:  Procedure Laterality Date  . OVARIAN CYST SURGERY    . WISDOM TOOTH EXTRACTION      Family History  Problem Relation Age of Onset  . Obesity Mother   . Depression Mother   . Alcohol abuse Mother   . Heart disease Father   . Early death Father   . Obesity Brother   . Alcohol abuse Maternal Uncle   . Depression Maternal Uncle   . Hypertension Maternal Grandmother   . Depression Maternal Grandmother   . Alcohol abuse Maternal Grandmother   . Alzheimer's disease Maternal Grandmother   . Depression Maternal Grandfather   . Stroke Maternal Grandfather   . Heart disease Maternal Grandfather   . Suicidality Maternal Grandfather   .  Suicidality Paternal Grandmother   . Depression Paternal Grandmother   . Heart disease Paternal Grandfather   . Early death Paternal Grandfather     Social History   Tobacco Use  . Smoking status: Never Smoker  . Smokeless tobacco: Never Used  Vaping Use  . Vaping Use: Never used  Substance Use Topics  . Alcohol use: No  . Drug use: No     Current Outpatient Medications:  .  amphetamine-dextroamphetamine (ADDERALL) 10 MG tablet, TAKE 1 TABLET BY MOUTH DAILY with breakfast, Disp: 30 tablet, Rfl: 0 .  FLUoxetine (PROZAC) 20 MG capsule, Take 3 capsules (60 mg total) by mouth daily., Disp: 270 capsule, Rfl: 3 .  norethindrone-ethinyl estradiol (LOESTRIN) 1-20 MG-MCG tablet, Take 1 tablet by mouth daily., Disp: 84 tablet,  Rfl: 3 .  spironolactone (ALDACTONE) 50 MG tablet, 1 tab po daily x 1-2wks then increase to 2 tabs (100mg ) po daily (Patient not taking: Reported on 04/13/2021), Disp: 180 tablet, Rfl: 1  No Known Allergies    ROS: See pertinent positives and negatives per HPI.   EXAM:  VITALS per patient if applicable: There were no vitals taken for this visit.   Wt Readings from Last 3 Encounters:  09/29/20 135 lb (61.2 kg)  07/20/20 145 lb (65.8 kg)  07/05/20 140 lb 3.2 oz (63.6 kg)   Temp Readings from Last 3 Encounters:  07/05/20 (!) 97.4 F (36.3 C) (Tympanic)  03/08/20 97.7 F (36.5 C) (Temporal)  03/02/20 97.6 F (36.4 C) (Tympanic)   BP Readings from Last 3 Encounters:  07/05/20 (!) 112/58  03/08/20 110/75  03/02/20 118/60   Pulse Readings from Last 3 Encounters:  07/05/20 79  03/08/20 72  03/02/20 100     GENERAL: alert, oriented, in no acute distress  HEENT: atraumatic, conjunctiva clear, no obvious abnormalities on inspection of external nose and ears  NECK: normal movements of the head and neck  LUNGS: on inspection no signs of respiratory distress, breathing rate appears normal, no obvious gross SOB, gasping or wheezing, no conversational dyspnea  CV: no obvious cyanosis  PSYCH/NEURO: pleasant and cooperative, speech and thought processing grossly intact   ASSESSMENT AND PLAN:  1. Depression with anxiety - PHQ-9 = 14, GAD-7 = 17 - currently on prozac 60mg  daily but not effective - pt notes increased stress relate to son with medical needs and recent autism diagnosis as well as marital strain/stress - has been tried on fluvoxamine, buspar, wellbutrin, and possibly other meds in the past Rx: - Vilazodone HCl (VIIBRYD) 20 MG TABS; Take 1 tablet (20 mg total) by mouth daily.  Dispense: 30 tablet; Refill: 2 - could consider effexor if 20mg  or 40mg  daily of viibryd is not effective - [facility] counseling was very beneficial in the past but stopped during covid when  in-person appts were not an option. Pt is hesitant to resume due to stress of caring for 3 young children, 1 of whom has regular PT, OT, speech therapy appts - Ambulatory referral to Psychiatry  I spent 32 min with the patient today discussing symptoms, medication options, referral, treatment plan.    I discussed the assessment and treatment plan with the patient. The patient was provided an opportunity to ask questions and all were answered. The patient agreed with the plan and demonstrated an understanding of the instructions.   The patient was advised to call back or seek an in-person evaluation if the symptoms worsen or if the condition fails to improve as anticipated.  [name], DO

## 2021-04-17 NOTE — Progress Notes (Signed)
Cardiology Office Note  Date: 04/18/2021   ID: Tracy Gallegos, DOB [birth year], MRN [id number]  PCP:  Ronnald Nian, DO  Cardiologist:  Rozann Lesches, MD Electrophysiologist:  None   Chief Complaint  Patient presents with  . Palpitations    History of Present Illness: Tracy Gallegos is a 35 y.o. female referred for cardiology consultation by Dr. Bryan Lemma for the evaluation of palpitations.  She states that her symptoms have actually settled down since just before wearing her cardiac monitor, but prior to that had been experiencing a feeling of forceful heartbeat and skips, also anxiousness as if she might be having a sense of panic.  Symptoms went from every few days to more frequently, and then resolved fairly spontaneously.  No obvious precipitant, but she has been under a lot of stress.  Diet also has not been as optimal, she has been consuming more caffeine.  She previously ran in high school, also continued running for stress reduction in college.  She has not been able to continue with a regular exercise plan.  Her husband is Dr. Grandville Silos with [employer].  Cardiac monitor showed sinus rhythm with normal heart rate variation and only rare PACs and PVCs, no arrhythmias or pauses.  We discussed this today.  I personally reviewed her ECG which is normal, normal intervals.  Past Medical History:  Diagnosis Date  . Depression   . Endometriosis   . History of gestational diabetes 01/23/2018  . Ovarian cyst    Bilateral    Past Surgical History:  Procedure Laterality Date  . OVARIAN CYST SURGERY    . WISDOM TOOTH EXTRACTION      Current Outpatient Medications  Medication Sig Dispense Refill  . amphetamine-dextroamphetamine (ADDERALL) 10 MG tablet TAKE 1 TABLET BY MOUTH DAILY with breakfast 30 tablet 0  . FLUoxetine (PROZAC) 20 MG tablet Take 60 mg by mouth daily.    . norethindrone-ethinyl estradiol (LOESTRIN) 1-20 MG-MCG tablet Take 1 tablet by mouth daily.  84 tablet 3   No current facility-administered medications for this visit.   Allergies:  Patient has no known allergies.   Social History: The patient  reports that she has never smoked. She has never used smokeless tobacco. She reports that she does not drink alcohol and does not use drugs.   Family History: The patient's family history includes Alcohol abuse in her maternal grandmother, maternal uncle, and mother; Alzheimer's disease in her maternal grandmother; Depression in her maternal grandfather, maternal grandmother, maternal uncle, mother, and paternal grandmother; Early death in her father and paternal grandfather; Heart disease in her father, maternal grandfather, and paternal grandfather; Hypertension in her maternal grandmother; Obesity in her brother and mother; Stroke in her maternal grandfather; Suicidality in her maternal grandfather and paternal grandmother.   ROS: No sudden unexplained syncope, no specific exertional symptoms.  Physical Exam: VS:  BP 100/62   Pulse 67   Ht 5\' 8"  (1.727 m)   Wt 132 lb 12.8 oz (60.2 kg)   SpO2 99%   BMI 20.19 kg/m , BMI Body mass index is 20.19 kg/m.  Wt Readings from Last 3 Encounters:  04/18/21 132 lb 12.8 oz (60.2 kg)  09/29/20 135 lb (61.2 kg)  07/20/20 145 lb (65.8 kg)    General: Patient appears comfortable at rest. HEENT: Conjunctiva and lids normal, wearing a mask. Neck: Supple, no elevated JVP or carotid bruits, no thyromegaly. Lungs: Clear to auscultation, nonlabored breathing at rest. Cardiac: Regular rate and rhythm, no S3  or significant systolic murmur, no pericardial rub. Extremities: No pitting edema, distal pulses 2+.  ECG:  No prior tracings for review today.  Recent Labwork:  March 2022: Hemoglobin 13.8, platelets 299, BUN 10, creatinine 0.91, potassium 4.9, AST 17, ALT 10, cholesterol 175, triglycerides 82, HDL 53, LDL 107, hemoglobin A1c 5.2%, TSH 2.26, magnesium 2.1  Other Studies Reviewed  Today:  Cardiac monitor March 2022: ZIO XT reviewed.  5 days 4 hours analyzed.  Predominant rhythm is sinus with heart rate ranging from 61 bpm up to 151 bpm and average heart rate 86 bpm.  There were rare PACs including couplets representing less than 1% total beats.  There were also rare PVCs representing less than 1% total beats.  No significant arrhythmias or pauses.  Assessment and Plan:  Palpitations as discussed above.  No sudden syncope or exertional symptoms, ECG is normal.  Although symptoms had resolved by the time that she wore the Zio patch, monitor showed only rare PACs and PVCs, I suspect that she was having more frequent ectopy with her symptoms, predominantly related to PVCs.  She showed me one PVC that was captured by her apple watch.  At this point I would recommend lifestyle modification, hopefully stress reduction, return to exercise, cutting back on caffeine.  No red flags at this point to pursue additional cardiac testing.  We can certainly see her back if symptoms escalate.  Medication Adjustments/Labs and Tests Ordered: Current medicines are reviewed at length with the patient today.  Concerns regarding medicines are outlined above.   Tests Ordered: Orders Placed This Encounter  Procedures  . EKG 12-Lead    Medication Changes: No orders of the defined types were placed in this encounter.   Disposition:  Follow up prn  Signed, Satira Sark, MD, [facility] 04/18/2021 11:50 AM    [facility] [place], [place], [place] [address] Phone: [phone]; Fax: [phone]

## 2021-04-18 ENCOUNTER — Ambulatory Visit (INDEPENDENT_AMBULATORY_CARE_PROVIDER_SITE_OTHER): Payer: BC Managed Care – PPO | Admitting: Cardiology

## 2021-04-18 ENCOUNTER — Encounter: Payer: Self-pay | Admitting: Cardiology

## 2021-04-18 VITALS — BP 100/62 | HR 67 | Ht 68.0 in | Wt 132.8 lb

## 2021-04-18 DIAGNOSIS — R002 Palpitations: Secondary | ICD-10-CM | POA: Diagnosis not present

## 2021-04-18 NOTE — Patient Instructions (Addendum)
Medication Instructions:   Your physician recommends that you continue on your current medications as directed. Please refer to the Current Medication list given to you today.  Labwork:  none  Testing/Procedures:  none  Follow-Up:  Your physician recommends that you schedule a follow-up appointment in: as needed.  Any Other Special Instructions Will Be Listed Below (If Applicable).  If you need a refill on your cardiac medications before your next appointment, please call your pharmacy. 

## 2021-05-03 ENCOUNTER — Encounter: Payer: Self-pay | Admitting: Family Medicine

## 2021-05-10 ENCOUNTER — Ambulatory Visit: Payer: BC Managed Care – PPO

## 2021-05-10 ENCOUNTER — Other Ambulatory Visit: Payer: Self-pay

## 2021-05-10 ENCOUNTER — Ambulatory Visit (INDEPENDENT_AMBULATORY_CARE_PROVIDER_SITE_OTHER): Payer: BC Managed Care – PPO

## 2021-05-10 DIAGNOSIS — Z23 Encounter for immunization: Secondary | ICD-10-CM | POA: Diagnosis not present

## 2021-05-10 NOTE — Progress Notes (Signed)
   Covid-19 Vaccination Clinic  Name:  Tracy Gallegos    MRN: [id number] DOB: 01-25-1986  05/10/2021  [name] was observed post Covid-19 immunization for 15 minutes without incident. She was provided with Vaccine Information Sheet and instruction to access the V-Safe system.   [name] was instructed to call 911 with any severe reactions post vaccine: Difficulty breathing  Swelling of face and throat  A fast heartbeat  A bad rash all over body  Dizziness and weakness   Immunizations Administered     Name Date Dose VIS Date Route   PFIZER Comrnaty(Gray TOP) Covid-19 Vaccine 05/10/2021  4:25 PM 0.3 mL 10/19/2020 Intramuscular   Manufacturer: [employer]   Lot: [id number]   [place]: [phone]

## 2021-05-10 NOTE — Addendum Note (Signed)
Addended by: Marcell Anger on: 05/10/2021 04:55 PM   Modules accepted: Level of Service

## 2021-05-15 ENCOUNTER — Other Ambulatory Visit: Payer: Self-pay | Admitting: Family Medicine

## 2021-05-15 DIAGNOSIS — R5383 Other fatigue: Secondary | ICD-10-CM

## 2021-05-15 DIAGNOSIS — F418 Other specified anxiety disorders: Secondary | ICD-10-CM

## 2021-05-15 DIAGNOSIS — G479 Sleep disorder, unspecified: Secondary | ICD-10-CM

## 2021-05-16 MED ORDER — VILAZODONE HCL 20 MG PO TABS: 30.0000 mg | ORAL_TABLET | Freq: Every day | ORAL | 0 refills | Status: DC

## 2021-05-24 ENCOUNTER — Encounter: Payer: Self-pay | Admitting: Family Medicine

## 2021-05-29 NOTE — Telephone Encounter (Signed)
Appointment scheduled.

## 2021-06-06 ENCOUNTER — Other Ambulatory Visit: Payer: Self-pay | Admitting: *Deleted

## 2021-06-06 DIAGNOSIS — R002 Palpitations: Secondary | ICD-10-CM

## 2021-06-06 MED ORDER — METOPROLOL SUCCINATE ER 25 MG PO TB24: 25.0000 mg | ORAL_TABLET | Freq: Every day | ORAL | 1 refills | Status: DC

## 2021-06-06 NOTE — Telephone Encounter (Signed)
Thank you for updating me on your symptoms.  I looked over the chart as well and the last few messages.  It sounds like the spell that you had at home may have been PSVT that resolved on its own.  I agree that beta-blocker would be a reasonable step at this time.  I will ask my nurse to send in Toprol-XL 25 mg, although you may want to start with 12.5 mg daily to see how you do since your resting heart rate is not very high.  We will also go ahead and order an echocardiogram.  We can have an office follow-up set up thereafter to see how things are going.

## 2021-06-08 ENCOUNTER — Telehealth (INDEPENDENT_AMBULATORY_CARE_PROVIDER_SITE_OTHER): Payer: BC Managed Care – PPO | Admitting: Family Medicine

## 2021-06-08 ENCOUNTER — Encounter: Payer: Self-pay | Admitting: Family Medicine

## 2021-06-08 VITALS — Ht 68.0 in

## 2021-06-08 DIAGNOSIS — F418 Other specified anxiety disorders: Secondary | ICD-10-CM

## 2021-06-08 DIAGNOSIS — Z3041 Encounter for surveillance of contraceptive pills: Secondary | ICD-10-CM | POA: Diagnosis not present

## 2021-06-08 DIAGNOSIS — R5383 Other fatigue: Secondary | ICD-10-CM

## 2021-06-08 DIAGNOSIS — G479 Sleep disorder, unspecified: Secondary | ICD-10-CM

## 2021-06-08 MED ORDER — BUPROPION HCL ER (XL) 150 MG PO TB24: 150.0000 mg | ORAL_TABLET | Freq: Every day | ORAL | 1 refills | Status: DC

## 2021-06-08 MED ORDER — DROSPIRENONE-ETHINYL ESTRADIOL 3-0.02 MG PO TABS
1.0000 | ORAL_TABLET | Freq: Every day | ORAL | 3 refills | Status: DC
  Filled 2022-02-12: qty 84, 84d supply, fill #0

## 2021-06-08 MED ORDER — AMPHETAMINE-DEXTROAMPHETAMINE 10 MG PO TABS: 10.0000 mg | ORAL_TABLET | Freq: Every day | ORAL | 0 refills | Status: DC

## 2021-06-08 MED ORDER — ESCITALOPRAM OXALATE 20 MG PO TABS
ORAL_TABLET | ORAL | 1 refills | Status: DC
Start: 2021-06-08 — End: 2021-06-08

## 2021-06-08 MED ORDER — ESCITALOPRAM OXALATE 20 MG PO TABS: ORAL_TABLET | ORAL | 1 refills | Status: DC

## 2021-06-08 MED ORDER — SPIRONOLACTONE 50 MG PO TABS
50.0000 mg | ORAL_TABLET | Freq: Every day | ORAL | 1 refills | Status: DC
Start: 2021-06-08 — End: 2021-08-02

## 2021-06-08 NOTE — Progress Notes (Signed)
Virtual Visit via Video Note  I connected with Tracy Gallegos on 06/08/21 at  2:30 PM EDT by a video enabled telemedicine application and verified that I am speaking with the correct person using two identifiers. Location patient: home Location provider: work  Persons participating in the virtual visit: patient, provider  I discussed the limitations of evaluation and management by telemedicine and the availability of in person appointments. The patient expressed understanding and agreed to proceed.  Chief Complaint  Patient presents with   Follow-up    Discuss medications.     HPI: Tracy Gallegos is a 35 y.o. female patient seen today to f/u on depression and anxiety. She is currently taking viibryd '30mg'$  daily and does not feel this is effective. Prior to this, she was on prozac '60mg'$  but over time felt like it lost its efficacy. Pt has been tried on fluvoxamine, buspar, wellbutrin, and possibly other meds in the past. At last VV 7 wks ago, pt declined resuming [facility] counseling d/t already feeling stressed about time and all she has to manage with her 3 younger children, including her son with special needs. I did place a referral to psychiatry ([facility]) at that time and she is on a wait list for psych appt with [facility]. She has appt with online psychiatrist ([facility]) tomorrow.  Pt states she would like to try lexapro. She wonders about adding wellbutrin to lexapro. She was on this in the past but felt it made her angry and on edge. She has actually been taking previous Rx wellbutrin '150mg'$  and has been taking it for the past 5 days. No side effects. She is taking adderall '10mg'$  daily.  Pt endorses feeling like her depression has worse since husband works 80hrs/wk, on call a lot. Son gets medical care in/around [place].  She is going to [facility] to Dr. Gena Fray.   Past Medical History:  Diagnosis Date   Depression    Endometriosis    History of gestational diabetes 01/23/2018    Ovarian cyst    Bilateral    Past Surgical History:  Procedure Laterality Date   OVARIAN CYST SURGERY     WISDOM TOOTH EXTRACTION      Family History  Problem Relation Age of Onset   Obesity Mother    Depression Mother    Alcohol abuse Mother    Heart disease Father    Early death Father    Obesity Brother    Alcohol abuse Maternal Uncle    Depression Maternal Uncle    Hypertension Maternal Grandmother    Depression Maternal Grandmother    Alcohol abuse Maternal Grandmother    Alzheimer's disease Maternal Grandmother    Depression Maternal Grandfather    Stroke Maternal Grandfather    Heart disease Maternal Grandfather    Suicidality Maternal Grandfather    Suicidality Paternal Grandmother    Depression Paternal Grandmother    Heart disease Paternal Grandfather    Early death Paternal Grandfather     Social History   Tobacco Use   Smoking status: Never   Smokeless tobacco: Never  Vaping Use   Vaping Use: Never used  Substance Use Topics   Alcohol use: No   Drug use: No     Current Outpatient Medications:    amphetamine-dextroamphetamine (ADDERALL) 10 MG tablet, TAKE 1 TABLET BY MOUTH DAILY with breakfast, Disp: 30 tablet, Rfl: 0   escitalopram (LEXAPRO) 20 MG tablet, Take 1/2 tab po daily then may increase to 1 tab po  daily after 1-2wks, Disp: 90 tablet, Rfl: 1   metoprolol succinate (TOPROL XL) 25 MG 24 hr tablet, Take 1 tablet (25 mg total) by mouth daily., Disp: 90 tablet, Rfl: 1   norethindrone-ethinyl estradiol (LOESTRIN) 1-20 MG-MCG tablet, Take 1 tablet by mouth daily., Disp: 84 tablet, Rfl: 3  No Known Allergies    ROS: See pertinent positives and negatives per HPI.   EXAM:  VITALS per patient if applicable: Ht '5\' 8"'$  (1.727 m)   BMI 20.19 kg/m    Wt Readings from Last 3 Encounters:  04/18/21 132 lb 12.8 oz (60.2 kg)  09/29/20 135 lb (61.2 kg)  07/20/20 145 lb (65.8 kg)   Temp Readings from Last 3 Encounters:  07/05/20 (!) 97.4 F  (36.3 C) (Tympanic)  03/08/20 97.7 F (36.5 C) (Temporal)  03/02/20 97.6 F (36.4 C) (Tympanic)   BP Readings from Last 3 Encounters:  04/18/21 100/62  07/05/20 (!) 112/58  03/08/20 110/75   Pulse Readings from Last 3 Encounters:  04/18/21 67  07/05/20 79  03/08/20 72     GENERAL: alert, oriented, appears well and in no acute distress  NECK: normal movements of the head and neck  LUNGS: on inspection no signs of respiratory distress, breathing rate appears normal, no obvious gross SOB, gasping or wheezing, no conversational dyspnea  CV: no obvious cyanosis  MS: moves all visible extremities without noticeable abnormality  PSYCH/NEURO: pleasant and cooperative, speech and thought processing grossly intact   ASSESSMENT AND PLAN: 1. Depression with anxiety - appt with online psychiatry tomorrow and on wait list for psychiatrist  - stop viibryd Rx: - escitalopram (LEXAPRO) 20 MG tablet; Take 1/2 tab po daily then may increase to 1 tab po daily after 1-2wks  Dispense: 90 tablet; Refill: 1 Cont: - wellbutrin '150mg'$  daily  - f/u with psych as scheduled  2. Sleep disorder, unspecified 3. Fatigue, unspecified type - database reviewed and appropriate Refill: - amphetamine-dextroamphetamine (ADDERALL) 10 MG tablet; Take 1 tablet (10 mg total) by mouth daily with breakfast.  Dispense: 30 tablet; Refill: 0  4. Encounter for birth control pills maintenance - d/c lo estrin Rx: - drospirenone-ethinyl estradiol (YAZ) 3-0.02 MG tablet; Take 1 tablet by mouth daily.  Dispense: 84 tablet; Refill: 3  I discussed the assessment and treatment plan with the patient. The patient was provided an opportunity to ask questions and all were answered. The patient agreed with the plan and demonstrated an understanding of the instructions.   The patient was advised to call back or seek an in-person evaluation if the symptoms worsen or if the condition fails to improve as anticipated.   [name], DO

## 2021-06-09 ENCOUNTER — Encounter: Payer: Self-pay | Admitting: Family Medicine

## 2021-06-09 DIAGNOSIS — F418 Other specified anxiety disorders: Secondary | ICD-10-CM

## 2021-06-13 ENCOUNTER — Telehealth: Payer: Self-pay | Admitting: Family Medicine

## 2021-06-13 NOTE — Telephone Encounter (Signed)
error 

## 2021-06-27 ENCOUNTER — Telehealth: Payer: Self-pay | Admitting: Family Medicine

## 2021-06-27 NOTE — Telephone Encounter (Signed)
Pt called and said that she had originally talked to Dr Bryan Lemma about getting on going GI issue, she said Dr Bryan Lemma talked about putting in a referral for [facility]. Pt has significant abdominal pain that comes and goes, its everyday and said that sometimes she cant walk, she said she had this before her pregnancy, she had her kidneys checked and that wasn't it. Please advise

## 2021-07-03 NOTE — Telephone Encounter (Signed)
Pt requested call back from the nurse, please advise

## 2021-07-03 NOTE — Telephone Encounter (Signed)
Called and lvm. Sw,cma ?

## 2021-08-02 ENCOUNTER — Encounter: Payer: Self-pay | Admitting: Family Medicine

## 2021-08-02 ENCOUNTER — Ambulatory Visit (INDEPENDENT_AMBULATORY_CARE_PROVIDER_SITE_OTHER): Payer: BC Managed Care – PPO | Admitting: Family Medicine

## 2021-08-02 ENCOUNTER — Ambulatory Visit (INDEPENDENT_AMBULATORY_CARE_PROVIDER_SITE_OTHER): Payer: BC Managed Care – PPO

## 2021-08-02 ENCOUNTER — Other Ambulatory Visit: Payer: Self-pay

## 2021-08-02 VITALS — BP 110/76 | HR 65 | Temp 97.6°F | Ht 67.99 in | Wt 138.8 lb

## 2021-08-02 DIAGNOSIS — L659 Nonscarring hair loss, unspecified: Secondary | ICD-10-CM

## 2021-08-02 DIAGNOSIS — R002 Palpitations: Secondary | ICD-10-CM | POA: Diagnosis not present

## 2021-08-02 DIAGNOSIS — Z8632 Personal history of gestational diabetes: Secondary | ICD-10-CM | POA: Diagnosis not present

## 2021-08-02 DIAGNOSIS — Z23 Encounter for immunization: Secondary | ICD-10-CM

## 2021-08-02 DIAGNOSIS — R109 Unspecified abdominal pain: Secondary | ICD-10-CM

## 2021-08-02 DIAGNOSIS — F418 Other specified anxiety disorders: Secondary | ICD-10-CM

## 2021-08-02 DIAGNOSIS — G8929 Other chronic pain: Secondary | ICD-10-CM | POA: Insufficient documentation

## 2021-08-02 LAB — ECHOCARDIOGRAM COMPLETE
AR max vel: 2.9 cm2
AV Area VTI: 2.89 cm2
AV Area mean vel: 2.81 cm2
AV Mean grad: 3.4 mmHg
AV Peak grad: 6.8 mmHg
Ao pk vel: 1.3 m/s
Area-P 1/2: 3.6 cm2
Calc EF: 73.8 %
Height: 67.992 in
S' Lateral: 3.13 cm
Single Plane A2C EF: 75.5 %
Single Plane A4C EF: 69.1 %
Weight: 2220.8 oz

## 2021-08-02 LAB — HEMOGLOBIN A1C: Hgb A1c MFr Bld: 5.4 % (ref 4.6–6.5)

## 2021-08-02 NOTE — Progress Notes (Deleted)
[facility] [address] [place] [address] Dept: [phone] Dept Fax: [phone]  Office Visit  Subjective:    Patient ID: Tracy Gallegos, female    DOB: 11-09-1986, 35 y.o..   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   Anti-D antibodies present during pregnancy 2017-10-13   Primary female infertility 09/15/2014   Attention deficit hyperactivity disorder 09/15/2014   Fibromyalgia 08/03/2013    Past Surgical History:  Procedure Laterality Date   OVARIAN CYST SURGERY     WISDOM TOOTH EXTRACTION      Family History  Problem Relation Age of Onset   Obesity Mother    Depression Mother    Alcohol abuse Mother    Heart disease Father    Early death Father    Obesity Brother    Alcohol abuse Maternal Uncle    Depression Maternal Uncle    Hypertension Maternal Grandmother    Depression Maternal Grandmother    Alcohol abuse Maternal Grandmother    Alzheimer's disease Maternal Grandmother    Depression Maternal Grandfather    Stroke Maternal Grandfather    Heart disease Maternal Grandfather    Suicidality Maternal Grandfather    Suicidality Paternal Grandmother    Depression Paternal Grandmother    Heart disease Paternal Grandfather    Early death Paternal Grandfather     Outpatient Medications Prior to Visit  Medication Sig Dispense Refill   amphetamine-dextroamphetamine (ADDERALL) 10 MG tablet Take 1 tablet (10 mg total) by mouth daily with breakfast. 30 tablet 0   drospirenone-ethinyl estradiol (YAZ) 3-0.02  MG tablet Take 1 tablet by mouth daily. 84 tablet 3   metoprolol succinate (TOPROL XL) 25 MG 24 hr tablet Take 1 tablet (25 mg total) by mouth daily. (Patient not taking: Reported on 08/02/2021) 90 tablet 1   buPROPion (WELLBUTRIN XL) 150 MG 24 hr tablet Take 1 tablet (150 mg total) by mouth daily. (Patient not taking: Reported on 08/02/2021) 90 tablet 1   escitalopram (LEXAPRO) 20 MG tablet Take 1/2 tab po daily then may increase to 1 tab po daily after 1-2wks (Patient not taking: Reported on 08/02/2021) 90 tablet 1   spironolactone (ALDACTONE) 50 MG tablet Take 1 tablet (50 mg total) by mouth daily. (Patient not taking: Reported on 08/02/2021) 90 tablet 1   No facility-administered medications prior to visit.    No Known Allergies    Objective:   Today's Vitals   08/02/21 1046  BP: 110/76  Pulse: 65  Temp: 97.6 F (36.4 C)  SpO2: 96%  Weight: 138 lb 12.8 oz (63 kg)  Height: 5' 7.99" (1.727 m)  PainSc: 0-No pain   Body mass index is 21.11 kg/m.   General: Well developed, well nourished. No acute distress. HEENT: Normocephalic, non-traumatic. PERRL, EOMI. Conjunctiva clear. Fundiscopic exam shows normal disc and vasculature. External ears normal. EAC and TMs normal bilaterally. Nose    clear without congestion or rhinorrhea. Mucous membranes moist. Oropharynx clear. Good dentition. Neck: Supple. No lymphadenopathy. No thyromegaly. Lungs: Clear to auscultation bilaterally. No wheezing, rales or rhonchi. CV: RRR without  murmurs or rubs. Pulses 2+ bilaterally. Abdomen: Soft, non-tender. Bowel sounds positive, normal pitch and frequency. No hepatosplenomegaly. No rebound or guarding. Back: Straight. No CVA tenderness bilaterally. Extremities: Full ROM. No joint swelling or tenderness. No edema noted. Skin: Warm and dry. No rashes. Neuro:CN II-XII intact. Normal sensation and DTR bilaterally. Psych: Alert and oriented x3. Normal mood and affect.  Health Maintenance Due  Topic Date Due    Hepatitis C Screening  Never done   PAP SMEAR-Modifier  09/18/2020   COVID-19 Vaccine (2 - Pfizer risk series) 05/31/2021   INFLUENZA VACCINE  06/11/2021    Lab Results ***    Assessment & Plan:   1. Depression with anxiety ***  2. History of gestational diabetes *** - Hemoglobin A1c  3. Hair loss *** - Ambulatory referral to Dermatology  4. Chronic abdominal pain *** - Ambulatory referral to Gastroenterology    Haydee Salter, MD

## 2021-08-02 NOTE — Progress Notes (Signed)
[facility] [address] [place] [address] Dept: [phone] Dept Fax: [phone]  Transfer of Care Office Visit  Subjective:    Patient ID: Tracy Gallegos, female    DOB: [birth year], 35 y.o..   MRN: [id number]  Chief Complaint  Patient presents with   Transitions Of Care    History of Present Illness:  Patient is in today to establish care. Ms. Bellino is originally from [place], [place]. She attending college at [employer], majoring in Product/process development scientist. She is married to a physician Animator). They currently live in [place], [place], though are planning to move to [place]. They have three children (75 yo daughter, 101 yo son, and 37 mo daughter). There son has a Chromosome 2p deletion syndrome, involving neurexin-1. Ms. Bodner does not have ready access to specialized therapy services for him in [place], so has done much of this herself. She notes that her son's development has been much greater than was predicted. He does currently have a g-tube. He has also been diagnosed with autism. Because of his needs, she does not work outside the home.She was previously working as an Garment/textile technologist. Ms. Langland denies tobacco or drug use, and only rarely drinks alcohol.  Ms. Thoma has a history of infertility. This was likely due to extensive endometriosis. She did have to have surgery to remove endometriosis involving one of her ovaries, but was also noted to have involvement of the omentum and bowel as well. After treatment, she was successful in achieving pregnancy. over the past 5 years or so, she has experienced chronic episodic abdominal pain. She had previously been scheduled for a CT scan to assess this, but then became pregnant. This led to a postponement of her evaluation for this. Now that she is 16 months out from her last pregnancy, she wants to pursue a workup. The pain was previously more on the right, but now seems to  mostly involve the left abdomen. The pain is greater with walking. She has had no blood in her stool. She currently has an upcoming appointment with [facility].  Ms. Mortell has a fairly extensive history of depression with anxiety. She notes she was managed on Prozac (fluoxetine) for many years. She did reach a point where she felt this was no longer effective. She has since been on vilazodone, escitalopram, buspirone, bupropion, fluvoxamine and a trial back on Prozac (which caused headaches). Currently, she is off of her medications. She does have an upcoming appointment with a psychiatrist in [place]. She currently prefers to stay off of her meds until she can discuss these with the psychiatrist.  Ms. Wheless has a history of a sleep disorder. She has been seen by Dr. Barnie Mort who treated her initially with Provigil to manage morning drowsiness. She is now on Adderall 5 mg bid and finds that this has helped considerably.  Ms. Stambaugh has a history of hair loss. she notes that some months ago, this was coming out and thinning severely. she was started on spironolactone and she is unsure if she has had any improvement or not. She wonders about the impact of her endometriosis and hormonal shifts. She would like to have a dermatology assessment.  Ms. Mungia has had some issues with palpitations. She underwent a cardiac evaluation that showed only occasional PVCs and PACs. She was apparently started on metoprolol at some point, though I can't find the documentation on this. She has an upcoming appointment with Dr. Domenic Polite  and notes an echocardiogram is planned.  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   Anti-D antibodies present  during pregnancy 09/23/2017   Primary female infertility 09/15/2014   Attention deficit hyperactivity disorder 09/15/2014   Fibromyalgia 08/03/2013   Past Surgical History:  Procedure Laterality Date   OVARIAN CYST SURGERY     WISDOM TOOTH EXTRACTION     Family History  Problem Relation Age of Onset   Obesity Mother    Depression Mother    Alcohol abuse Mother    Heart disease Father    Early death Father    Obesity Brother    Alcohol abuse Maternal Uncle    Depression Maternal Uncle    Hypertension Maternal Grandmother    Depression Maternal Grandmother    Alcohol abuse Maternal Grandmother    Alzheimer's disease Maternal Grandmother    Depression Maternal Grandfather    Stroke Maternal Grandfather    Heart disease Maternal Grandfather    Suicidality Maternal Grandfather    Suicidality Paternal Grandmother    Depression Paternal Grandmother    Heart disease Paternal Grandfather    Early death Paternal Grandfather    Outpatient Medications Prior to Visit  Medication Sig Dispense Refill   amphetamine-dextroamphetamine (ADDERALL) 10 MG tablet Take 1 tablet (10 mg total) by mouth daily with breakfast. 30 tablet 0   drospirenone-ethinyl estradiol (YAZ) 3-0.02 MG tablet Take 1 tablet by mouth daily. 84 tablet 3   metoprolol succinate (TOPROL XL) 25 MG 24 hr tablet Take 1 tablet (25 mg total) by mouth daily. (Patient not taking: Reported on 08/02/2021) 90 tablet 1   buPROPion (WELLBUTRIN XL) 150 MG 24 hr tablet Take 1 tablet (150 mg total) by mouth daily. (Patient not taking: Reported on 08/02/2021) 90 tablet 1   escitalopram (LEXAPRO) 20 MG tablet Take 1/2 tab po daily then may increase to 1 tab po daily after 1-2wks (Patient not taking: Reported on 08/02/2021) 90 tablet 1   spironolactone (ALDACTONE) 50 MG tablet Take 1 tablet (50 mg total) by mouth daily. (Patient not taking: Reported on 08/02/2021) 90 tablet 1   No facility-administered medications prior to visit.   No Known  Allergies    Objective:   Today's Vitals   08/02/21 1046  BP: 110/76  Pulse: 65  Temp: 97.6 F (36.4 C)  SpO2: 96%  Weight: 138 lb 12.8 oz (63 kg)  Height: 5' 7.99" (1.727 m)  PainSc: 0-No pain   Body mass index is 21.11 kg/m.   General: Well developed, well nourished. No acute distress. Scalp: No areas of alopecia noted. Mild thinning at the temples bilaterally. Hair is generally thin. Psych: Alert and oriented. Normal mood and affect.  Health Maintenance Due  Topic Date Due   Hepatitis C Screening  Never done   PAP SMEAR-Modifier  09/18/2020   COVID-19 Vaccine (2 - Pfizer risk series) 05/31/2021     Assessment & Plan:   1. Depression with anxiety Ms. Delone has an extensive history of trying various medications for management of her anxiety and depression. We agreed that at this point, she will await seeing a  psychiatrist before making a decision on what drug or combination of drugs she should be one.  2. History of gestational diabetes Ms. Manuelito has a  history of GDM. We will check an HbA1c to screen for any diabetes currently.  - Hemoglobin A1c  3. Hair loss The hair loss may have represented a telogen effluvium. However, with her concerns about hormonal issues, I will go ahead and refer her to dermatology for an assessment.  - Ambulatory referral to Dermatology  4. Chronic abdominal pain Ms. Mastandrea has a long-standing history of chronic abdominal pain. she is already scheduled with GI, which seems appropriate. I will place  areferral for her insurance purposes.  - Ambulatory referral to Gastroenterology  5. Need for influenza vaccination  - Flu Vaccine QUAD 86mo+IM (Fluarix, Fluzone & Alfiuria Quad PF)  I spent 50 minutes reviewing the chart, including prior consult notes from cardiology, taking a history, reconciling her medications, establishing a care plan, and documenting the encounter.  Haydee Salter, MD

## 2021-08-08 ENCOUNTER — Telehealth: Payer: Self-pay | Admitting: *Deleted

## 2021-08-08 NOTE — Telephone Encounter (Signed)
-----   Message from Satira Sark, MD sent at 08/02/2021  8:30 PM EDT ----- Results reviewed.  Cardiac function is normal with LVEF 60 to 65% and normal diastolic function.  Also normal RV contraction and no significant valvular abnormalities.  Overall reassuring.

## 2021-08-09 NOTE — Telephone Encounter (Signed)
Patient returned call to [facility]. Patient requested to have nurse leave a message on her phone due to her work schedule.

## 2021-08-09 NOTE — Telephone Encounter (Signed)
Patient informed. Copy sent to PCP °

## 2021-08-23 ENCOUNTER — Ambulatory Visit (INDEPENDENT_AMBULATORY_CARE_PROVIDER_SITE_OTHER): Payer: BC Managed Care – PPO | Admitting: Psychiatry

## 2021-08-23 ENCOUNTER — Encounter (HOSPITAL_COMMUNITY): Payer: Self-pay | Admitting: Psychiatry

## 2021-08-23 VITALS — BP 118/80 | Temp 98.2°F | Ht 68.0 in | Wt 137.0 lb

## 2021-08-23 DIAGNOSIS — F411 Generalized anxiety disorder: Secondary | ICD-10-CM | POA: Diagnosis not present

## 2021-08-23 DIAGNOSIS — Z658 Other specified problems related to psychosocial circumstances: Secondary | ICD-10-CM | POA: Diagnosis not present

## 2021-08-23 DIAGNOSIS — F331 Major depressive disorder, recurrent, moderate: Secondary | ICD-10-CM

## 2021-08-23 MED ORDER — ESCITALOPRAM OXALATE 5 MG PO TABS: 5.0000 mg | ORAL_TABLET | Freq: Every day | ORAL | 0 refills | Status: DC

## 2021-08-23 MED ORDER — LAMOTRIGINE 25 MG PO TABS: 25.0000 mg | ORAL_TABLET | Freq: Every day | ORAL | 0 refills | Status: DC
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
## 2021-09-05 ENCOUNTER — Other Ambulatory Visit: Payer: Self-pay | Admitting: Physician Assistant

## 2021-09-05 ENCOUNTER — Ambulatory Visit: Payer: BC Managed Care – PPO | Admitting: Cardiology

## 2021-09-05 DIAGNOSIS — R1032 Left lower quadrant pain: Secondary | ICD-10-CM

## 2021-09-17 ENCOUNTER — Telehealth (HOSPITAL_COMMUNITY): Payer: Self-pay | Admitting: Licensed Clinical Social Worker

## 2021-09-17 ENCOUNTER — Ambulatory Visit (INDEPENDENT_AMBULATORY_CARE_PROVIDER_SITE_OTHER): Payer: BC Managed Care – PPO | Admitting: Licensed Clinical Social Worker

## 2021-09-17 DIAGNOSIS — Z658 Other specified problems related to psychosocial circumstances: Secondary | ICD-10-CM | POA: Diagnosis not present

## 2021-09-17 DIAGNOSIS — F411 Generalized anxiety disorder: Secondary | ICD-10-CM | POA: Diagnosis not present

## 2021-09-17 DIAGNOSIS — F331 Major depressive disorder, recurrent, moderate: Secondary | ICD-10-CM
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
## 2021-09-17 NOTE — Telephone Encounter (Signed)
Able to do session and work on assessment

## 2021-09-17 NOTE — Telephone Encounter (Signed)
Tried to call pt to schd another appt - mailbox full

## 2021-09-17 NOTE — Progress Notes (Signed)
  Virtual Visit via Video Note  I connected with [name] on 09/17/21 at  3:00 PM EST by a video enabled telemedicine application and verified that I am speaking with the correct person using two identifiers.  Location: Patient: home Provider: office   I discussed the limitations of evaluation and management by telemedicine and the availability of in person appointments. The patient expressed understanding and agreed to proceed.  I discussed the assessment and treatment plan with the patient. The patient was provided an opportunity to ask questions and all were answered. The patient agreed with the plan and demonstrated an understanding of the instructions.   The patient was advised to call back or seek an in-person evaluation if the symptoms worsen or if the condition fails to improve as anticipated.  I provided 60 minutes of non-face-to-face time during this encounter.   Comprehensive Clinical Assessment (CCA) Screening, Triage and Referral Note  09/17/2021 KEARAH GAYDEN [id number]  Chief Complaint:  Chief Complaint  Patient presents with   Depression   Anxiety   psychosocial stressors   self-esteem   Visit Diagnosis: Major depressive disorder, recurrent, moderate, generalized anxiety disorder, psychosocial stressors

## 2021-09-21 ENCOUNTER — Other Ambulatory Visit: Payer: Self-pay

## 2021-09-21 ENCOUNTER — Ambulatory Visit
Admission: RE | Admit: 2021-09-21 | Discharge: 2021-09-21 | Disposition: A | Discharge status: Home / Self Care | Payer: BC Managed Care – PPO | Source: Ambulatory Visit | Attending: Physician Assistant | Admitting: Physician Assistant

## 2021-09-21 DIAGNOSIS — R1032 Left lower quadrant pain: Secondary | ICD-10-CM

## 2021-09-21 MED ORDER — IOPAMIDOL (ISOVUE-300) INJECTION 61%
100.0000 mL | Freq: Once | INTRAVENOUS | Status: AC | PRN
  Administered 2021-09-21: 100 mL via INTRAVENOUS

## 2021-09-26 ENCOUNTER — Telehealth (HOSPITAL_COMMUNITY): Payer: BC Managed Care – PPO | Admitting: Psychiatry

## 2021-10-10 ENCOUNTER — Telehealth (INDEPENDENT_AMBULATORY_CARE_PROVIDER_SITE_OTHER): Payer: BC Managed Care – PPO | Admitting: Psychiatry

## 2021-10-10 ENCOUNTER — Encounter (HOSPITAL_COMMUNITY): Payer: Self-pay | Admitting: Psychiatry

## 2021-10-10 DIAGNOSIS — F331 Major depressive disorder, recurrent, moderate: Secondary | ICD-10-CM | POA: Diagnosis not present

## 2021-10-10 DIAGNOSIS — F411 Generalized anxiety disorder: Secondary | ICD-10-CM | POA: Diagnosis not present

## 2021-10-10 DIAGNOSIS — Z658 Other specified problems related to psychosocial circumstances: Secondary | ICD-10-CM

## 2021-10-10 MED ORDER — ESCITALOPRAM OXALATE 10 MG PO TABS: 15.0000 mg | ORAL_TABLET | Freq: Every day | ORAL | 1 refills | Status: DC

## 2021-10-10 NOTE — Progress Notes (Signed)
[facility] Follow Up Visit  Patient Identification: Tracy Gallegos MRN:  [id number] Date of Evaluation:  10/10/2021 Referral Source: primary care Chief Complaint: follow up depression Visit Diagnosis:    ICD-10-CM   1. MDD (major depressive disorder), recurrent episode, moderate (HCC)  F33.1     2. GAD (generalized anxiety disorder)  F41.1     3. Psychosocial stressors  Z65.8      Virtual Visit via Video Note  I connected with Tracy Gallegos on 10/10/21 at 10:00 AM EST by a video enabled telemedicine application and verified that I am speaking with the correct person using two identifiers.  Location: Patient: home Provider: home office   I discussed the limitations of evaluation and management by telemedicine and the availability of in person appointments. The patient expressed understanding and agreed to proceed.      I discussed the assessment and treatment plan with the patient. The patient was provided an opportunity to ask questions and all were answered. The patient agreed with the plan and demonstrated an understanding of the instructions.   The patient was advised to call back or seek an in-person evaluation if the symptoms worsen or if the condition fails to improve as anticipated.  I provided 15 minutes of non-face-to-face time during this encounter.   Tracy Capron, MD  History of Present Illness: Patient is a 35 year old currently married Caucasian female initially  referred by primary care physician to establish care for depression has 3 kids her middle child son has autism.  Her husband is a Engineer, drilling.  She is a homemaker  Patient has been diagnosed with depression starting at age 83 with significant symptoms at that time.  She started Prozac and has been on different medication over the course of her adult life Postpartum 3 years ago having her son was challenging and she suffers from depression and it got precipitated.   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  Last visit started lexapro and lamictal, she has taken lexapro some improvement but still subdued due to circumstances, marital concerns  Have changed therapist from [facility] to other one whom has done marital therapy in the past and has comfort level   Aggravating factor; difficult childhood relationship, middle child has autism Modifying factors; her other kids, she tries to work out and has helped in the past  Duration since age 33 Past suicide attempt denies Past psychiatric admission denies    Past Psychiatric History: depression, anxiety  Previous Psychotropic Medications: Yes     Past Medical History:  Past Medical History:  Diagnosis Date   Depression    Endometriosis    History of gestational diabetes 01/23/2018   Ovarian cyst    Bilateral    Past Surgical History:  Procedure Laterality Date   OVARIAN CYST SURGERY     WISDOM TOOTH EXTRACTION      Family Psychiatric History: Father side ;depression,  Father: alcohol use   Family History:  Family History  Problem Relation Age of Onset   Obesity Mother    Depression Mother    Alcohol abuse Mother    Heart disease Father    Early death Father    Obesity Brother    Alcohol abuse Maternal Uncle    Depression Maternal Uncle    Hypertension Maternal Grandmother    Depression Maternal Grandmother    Alcohol abuse Maternal Grandmother    Alzheimer's disease Maternal Grandmother    Depression Maternal Grandfather    Stroke Maternal  Grandfather    Heart disease Maternal Grandfather    Suicidality Maternal Grandfather    Suicidality Paternal Grandmother    Depression Paternal Grandmother    Heart disease Paternal Grandfather    Early death Paternal Grandfather     Social History:   Social History   Socioeconomic History   Marital status: Married    Spouse name: Tracy Gallegos   Number of children: 3   Years of education: Not on file   Highest education level: Not on file   Occupational History   Not on file  Tobacco Use   Smoking status: Never   Smokeless tobacco: Never  Vaping Use   Vaping Use: Never used  Substance and Sexual Activity   Alcohol use: No   Drug use: No   Sexual activity: Yes    Birth control/protection: None  Other Topics Concern   Not on file  Social History Narrative   Special needs child   Social Determinants of Health   Financial Resource Strain: Not on file  Food Insecurity: Not on file  Transportation Needs: Not on file  Physical Activity: Not on file  Stress: Not on file  Social Connections: Not on file     Allergies:  No Known Allergies  Metabolic Disorder Labs: Lab Results  Component Value Date   HGBA1C 5.4 08/02/2021   No results found for: PROLACTIN No results found for: CHOL, TRIG, HDL, CHOLHDL, VLDL, LDLCALC Lab Results  Component Value Date   TSH 1.99 06/09/2019    Therapeutic Level Labs: No results found for: LITHIUM No results found for: CBMZ No results found for: VALPROATE  Current Medications: Current Outpatient Medications  Medication Sig Dispense Refill   amphetamine-dextroamphetamine (ADDERALL) 10 MG tablet Take 1 tablet (10 mg total) by mouth daily with breakfast. (Patient not taking: Reported on 08/23/2021) 30 tablet 0   drospirenone-ethinyl estradiol (YAZ) 3-0.02 MG tablet Take 1 tablet by mouth daily. (Patient not taking: Reported on 08/23/2021) 84 tablet 3   escitalopram (LEXAPRO) 10 MG tablet Take 1.5 tablets (15 mg total) by mouth daily. 45 tablet 1   metoprolol succinate (TOPROL XL) 25 MG 24 hr tablet Take 1 tablet (25 mg total) by mouth daily. (Patient not taking: Reported on 08/02/2021) 90 tablet 1   No current facility-administered medications for this visit.     Psychiatric Specialty Exam: Review of Systems  Cardiovascular:  Negative for chest pain.  Psychiatric/Behavioral:  Positive for dysphoric mood.    unknown if currently breastfeeding.There is no height or weight on  file to calculate BMI.  General Appearance: Casual  Eye Contact:  Fair  Speech:  Clear and Coherent  Volume:  Normal  Mood:  subdued  Affect:  Full Range  Thought Process:  Goal Directed  Orientation:  Full (Time, Place, and Person)  Thought Content:  Rumination  Suicidal Thoughts:  No  Homicidal Thoughts:  No  Memory:  Immediate;   Fair  Judgement:  Fair  Insight:  Fair  Psychomotor Activity:  Increased  Concentration:  Concentration: Fair  Recall:  Good  Fund of Knowledge:Good  Language: Good  Akathisia:  No  Handed:    AIMS (if indicated):  not done  Assets:  Desire for Improvement Financial Resources/Insurance Housing Social Support  ADL's:  Intact  Cognition: WNL  Sleep:  Fair   Screenings: GAD-7    Flowsheet Row Office Visit from 08/02/2021 in [place] Video Visit from 04/13/2021 in [place] Video Visit from 09/29/2020 in  [facility] Video Visit from 06/07/2019 in [place] Visit from 08/26/2018 in [place] [place]  Total GAD-7 Score 15 17 18 10 11       PHQ2-9    [place] [facility] from 09/17/2021 in [place] Office Visit from 08/23/2021 in [place] Office Visit from 08/02/2021 in [facility] [facility] Video Visit from 04/13/2021 in [place] Video Visit from 09/29/2020 in [facility]  PHQ-2 Total Score 6 4 3 4 6   PHQ-9 Total Score 18 10 12 14 17       Flowsheet Row Video Visit from 10/10/2021 in [place] Counselor from 09/17/2021 in [place] Office Visit from 08/23/2021 in [place] No Risk Error: Q6 is Yes, you must answer 7 No Risk       Assessment and  Plan: as follows   Major depressive disorder recurrent moderate to severe; subdued, stressors are high, continue therapy and increase lexapro to 15mg  from 10mg  She wants to hold off from lamictal for now  Generalized anxiety disorder: related to psychosocial stressors, provided supportive therapy increase lexapro to 15mg , continue therapy  Fu 52m or earlier if needed Tracy Capron, MD 11/30/202210:16 AM

## 2021-10-16 ENCOUNTER — Other Ambulatory Visit: Payer: Self-pay

## 2021-10-16 ENCOUNTER — Ambulatory Visit (INDEPENDENT_AMBULATORY_CARE_PROVIDER_SITE_OTHER): Payer: BC Managed Care – PPO | Admitting: Family Medicine

## 2021-10-16 VITALS — BP 102/66 | HR 76 | Temp 97.8°F | Ht 68.0 in | Wt 137.6 lb

## 2021-10-16 DIAGNOSIS — R109 Unspecified abdominal pain: Secondary | ICD-10-CM

## 2021-10-16 DIAGNOSIS — F909 Attention-deficit hyperactivity disorder, unspecified type: Secondary | ICD-10-CM

## 2021-10-16 DIAGNOSIS — F418 Other specified anxiety disorders: Secondary | ICD-10-CM

## 2021-10-16 DIAGNOSIS — L219 Seborrheic dermatitis, unspecified: Secondary | ICD-10-CM

## 2021-10-16 DIAGNOSIS — G8929 Other chronic pain: Secondary | ICD-10-CM

## 2021-10-16 DIAGNOSIS — L659 Nonscarring hair loss, unspecified: Secondary | ICD-10-CM | POA: Insufficient documentation

## 2021-10-16 MED ORDER — SPIRONOLACTONE 100 MG PO TABS: ORAL_TABLET | ORAL | 2 refills | Status: DC

## 2021-10-16 MED ORDER — MAXIDEX 0.1 % OP SUSP: 2.0000 [drp] | Freq: Two times a day (BID) | OPHTHALMIC | 1 refills | Status: DC

## 2021-10-16 NOTE — Progress Notes (Signed)
[facility] [address] [place] [address] Dept: [phone] Dept Fax: [phone]  Chronic Care Office Visit  Subjective:    Patient ID: Tracy Gallegos, female    DOB: 1986-04-24, 36 y.o..   MRN: [id number]  Chief Complaint  Patient presents with   Follow-up    2 month f/u ADHD/depression.   C/o having itchy ears.      History of Present Illness:  Patient is in today for reassessment of chronic medical issues.  [name] has a history of chronic episodic abdominal pain. She was recently seen by Dr. Donna Christen at [facility]. She had a CT scan of the abdomen, which was reportedly normal. She was started on bentyl and has found that this helps considerably.   [name] has a history of depression with anxiety. She is managed by Dr. De Nurse (psychiatry) and is currently on escitalopram 15 mg daily. She feels this is workign better for her.   [name] has a history of a sleep disorder, leading to daytime somnolence. She has been seen by Dr. Barnie Mort who treated her initially with Provigil to manage morning drowsiness. She is now on Adderall 5 mg bid and finds that this has helped considerably.   [name] has a history of hair loss. She never followed up with dermatology. She notes she was previously treated by Dr. Bryan Lemma with spironolactone. She stopped taking this, as she felt it was ineffective, but admits that she may not have given an adequate trial.  [name] has a history of scaliness and itching in her ear canals. She was previously prescribed Ciprodex, but she does not like to be on episodic antibiotics. She asks about getting a steroid drop to use for this.  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   Anti-D antibodies present during pregnancy 10-01-17   Primary female infertility 09/15/2014   Attention deficit hyperactivity disorder 09/15/2014   Fibromyalgia 08/03/2013   Past Surgical History:  Procedure Laterality Date   OVARIAN CYST SURGERY     WISDOM TOOTH EXTRACTION     Family History  Problem Relation Age of Onset   Obesity Mother    Depression Mother    Alcohol abuse Mother    Heart disease Father    Early death Father    Obesity Brother    Alcohol abuse Maternal Uncle    Depression Maternal Uncle    Hypertension Maternal Grandmother    Depression Maternal Grandmother    Alcohol abuse Maternal Grandmother    Alzheimer's disease Maternal Grandmother    Depression Maternal Grandfather    Stroke Maternal Grandfather    Heart disease Maternal Grandfather    Suicidality Maternal Grandfather    Suicidality Paternal Grandmother    Depression Paternal Grandmother    Heart disease Paternal Grandfather    Early death Paternal Grandfather    Outpatient Medications Prior to Visit  Medication Sig Dispense Refill   amphetamine-dextroamphetamine (ADDERALL) 10 MG tablet Take 1 tablet (10 mg total) by mouth daily with breakfast. 30 tablet 0   dicyclomine (BENTYL) 10 MG capsule Take 10 mg by mouth 3 (three) times daily.     drospirenone-ethinyl estradiol (YAZ) 3-0.02  MG tablet Take 1 tablet by mouth daily. 84 tablet 3   escitalopram (LEXAPRO) 10 MG tablet Take 1.5 tablets (15 mg total) by mouth daily. 45 tablet 1   naproxen (NAPROSYN) 500 MG tablet Take 500 mg by mouth 2 (two) times daily.     metoprolol succinate (TOPROL XL) 25 MG 24 hr tablet Take 1 tablet (25 mg total) by mouth daily. (Patient not taking: Reported on 08/02/2021) 90 tablet 1   No facility-administered medications prior to visit.   No Known Allergies    Objective:   Today's Vitals   10/16/21  1514  BP: 102/66  Pulse: 76  Temp: 97.8 F (36.6 C)  TempSrc: Temporal  SpO2: 96%  Weight: 137 lb 9.6 oz (62.4 kg)  Height: 5\' 8"  (1.727 m)   Body mass index is 20.92 kg/m.   General: Well developed, well nourished. No acute distress. Ears: Mild scaliness noted at the external auditory meatus. Psych: Alert and oriented. Normal mood and affect.  Health Maintenance Due  Topic Date Due   Pneumococcal Vaccine 47-72 Years old (1 - PCV) Never done   Hepatitis C Screening  Never done   COVID-19 Vaccine (5 - Booster for Pfizer series) 07/05/2021   Lab Results Last hemoglobin A1c Lab Results  Component Value Date   HGBA1C 5.4 08/02/2021   Assessment & Plan:   1. Depression with anxiety Stable on Lexapro. She will continue to follow with psychiatry.  2. Attention deficit hyperactivity disorder (ADHD), unspecified ADHD type Stable on low dose Adderall.  3. Chronic abdominal pain Improved with Bentyl. I suspect that this may represent IBS.  4. Seborrhea I will prescribe a steroid drop to use to control itching and scaliness. We did discuss potential flares to otitis externa, where she may need the Cipordex to resolve.  - dexamethasone (MAXIDEX) 0.1 % ophthalmic suspension; Place 2 drops into both ears 2 (two) times daily.  Dispense: 15 mL; Refill: 1  5. Hair loss I will restart her on spironolactone. She should stay on this for 6 months before deciding on the efficacy of this approach.  - spironolactone (ALDACTONE) 100 MG tablet; Take 0.5 tablets (50 mg total) by mouth daily for 7 days, THEN 1 tablet (100 mg total) daily for 23 days.  Dispense: 30 tablet; Refill: 2  Haydee Salter, MD

## 2021-10-31 ENCOUNTER — Encounter: Payer: Self-pay | Admitting: Family Medicine

## 2021-11-13 ENCOUNTER — Ambulatory Visit: Payer: BC Managed Care – PPO | Admitting: Cardiology

## 2021-11-13 NOTE — Progress Notes (Deleted)
Cardiology Office Note  Date: 11/13/2021   ID: Tracy Gallegos, DOB Jan 01, 1986, MRN [id number]  PCP:  Haydee Salter, MD  Cardiologist:  Rozann Lesches, MD Electrophysiologist:  None   No chief complaint on file.   History of Present Illness: Tracy Gallegos is a 36 y.o. female last seen in June.  She presents for follow-up of an episode of suspected PSVT as reviewed in the chart.  Follow-up echocardiogram in September revealed LVEF 60 to 65%, normal RV contraction, no major valvular abnormalities.  Past Medical History:  Diagnosis Date   Depression    Endometriosis    History of gestational diabetes 01/23/2018   Ovarian cyst    Bilateral    Past Surgical History:  Procedure Laterality Date   OVARIAN CYST SURGERY     WISDOM TOOTH EXTRACTION      Current Outpatient Medications  Medication Sig Dispense Refill   amphetamine-dextroamphetamine (ADDERALL) 10 MG tablet Take 1 tablet (10 mg total) by mouth daily with breakfast. 30 tablet 0   dexamethasone (MAXIDEX) 0.1 % ophthalmic suspension Place 2 drops into both ears 2 (two) times daily. 15 mL 1   dicyclomine (BENTYL) 10 MG capsule Take 10 mg by mouth 3 (three) times daily.     drospirenone-ethinyl estradiol (YAZ) 3-0.02 MG tablet Take 1 tablet by mouth daily. 84 tablet 3   escitalopram (LEXAPRO) 10 MG tablet Take 1.5 tablets (15 mg total) by mouth daily. 45 tablet 1   naproxen (NAPROSYN) 500 MG tablet Take 500 mg by mouth 2 (two) times daily.     spironolactone (ALDACTONE) 100 MG tablet Take 0.5 tablets (50 mg total) by mouth daily for 7 days, THEN 1 tablet (100 mg total) daily for 23 days. 30 tablet 2   No current facility-administered medications for this visit.   Allergies:  Patient has no known allergies.   Social History: The patient  reports that she has never smoked. She has never used smokeless tobacco. She reports that she does not drink alcohol and does not use drugs.   Family History: The  patient's family history includes Alcohol abuse in her maternal grandmother, maternal uncle, and mother; Alzheimer's disease in her maternal grandmother; Depression in her maternal grandfather, maternal grandmother, maternal uncle, mother, and paternal grandmother; Early death in her father and paternal grandfather; Heart disease in her father, maternal grandfather, and paternal grandfather; Hypertension in her maternal grandmother; Obesity in her brother and mother; Stroke in her maternal grandfather; Suicidality in her maternal grandfather and paternal grandmother.   ROS:  Please see the history of present illness. Otherwise, complete review of systems is positive for {NONE DEFAULTED:18576}.  All other systems are reviewed and negative.   Physical Exam: VS:  There were no vitals taken for this visit., BMI There is no height or weight on file to calculate BMI.  Wt Readings from Last 3 Encounters:  10/16/21 137 lb 9.6 oz (62.4 kg)  08/02/21 138 lb 12.8 oz (63 kg)  04/18/21 132 lb 12.8 oz (60.2 kg)    General: Patient appears comfortable at rest. HEENT: Conjunctiva and lids normal, oropharynx clear with moist mucosa. Neck: Supple, no elevated JVP or carotid bruits, no thyromegaly. Lungs: Clear to auscultation, nonlabored breathing at rest. Cardiac: Regular rate and rhythm, no S3 or significant systolic murmur, no pericardial rub. Abdomen: Soft, nontender, no hepatomegaly, bowel sounds present, no guarding or rebound. Extremities: No pitting edema, distal pulses 2+. Skin: Warm and dry. Musculoskeletal: No kyphosis. Neuropsychiatric: Alert and  oriented x3, affect grossly appropriate.  ECG:  An ECG dated 04/18/2021 was personally reviewed today and demonstrated:  Sinus rhythm.  Recent Labwork:  March 2022: Hemoglobin 13.8, platelets 299, BUN 10, creatinine 0.91, potassium 4.9, AST 17, ALT 10, cholesterol 175, triglycerides 82, HDL 53, LDL 107, hemoglobin A1c 5.2%, TSH 2.26, magnesium 2.1,  ferritin 58  Other Studies Reviewed Today:  Echocardiogram 08/02/2021:  1. Left ventricular ejection fraction, by estimation, is 60 to 65%. The  left ventricle has normal function. The left ventricle has no regional  wall motion abnormalities. Left ventricular diastolic parameters were  normal.   2. Right ventricular systolic function is normal. The right ventricular  size is normal. Tricuspid regurgitation signal is inadequate for assessing  PA pressure.   3. The mitral valve is normal in structure. No evidence of mitral valve  regurgitation. No evidence of mitral stenosis.   4. The aortic valve is tricuspid. Aortic valve regurgitation is not  visualized. No aortic stenosis is present.   5. The inferior vena cava is normal in size with greater than 50%  respiratory variability, suggesting right atrial pressure of 3 mmHg.   Assessment and Plan:    Medication Adjustments/Labs and Tests Ordered: Current medicines are reviewed at length with the patient today.  Concerns regarding medicines are outlined above.   Tests Ordered: No orders of the defined types were placed in this encounter.   Medication Changes: No orders of the defined types were placed in this encounter.   Disposition:  Follow up {follow up:15908}  Signed, Satira Sark, MD, [facility] 11/13/2021 9:32 AM    [facility] [place], [place], [place] [address] Phone: [phone]; Fax: [phone]

## 2021-12-11 ENCOUNTER — Other Ambulatory Visit (HOSPITAL_COMMUNITY): Payer: Self-pay

## 2021-12-11 MED ORDER — ESCITALOPRAM OXALATE 10 MG PO TABS: 15.0000 mg | ORAL_TABLET | Freq: Every day | ORAL | 0 refills | Status: DC

## 2021-12-12 ENCOUNTER — Telehealth (HOSPITAL_COMMUNITY): Payer: BC Managed Care – PPO | Admitting: Psychiatry

## 2022-02-12 ENCOUNTER — Telehealth (HOSPITAL_COMMUNITY): Payer: BC Managed Care – PPO | Admitting: Psychiatry

## 2022-02-12 ENCOUNTER — Encounter: Payer: Self-pay | Admitting: Family Medicine

## 2022-02-12 ENCOUNTER — Other Ambulatory Visit (HOSPITAL_BASED_OUTPATIENT_CLINIC_OR_DEPARTMENT_OTHER): Payer: Self-pay

## 2022-02-12 DIAGNOSIS — Z3041 Encounter for surveillance of contraceptive pills: Secondary | ICD-10-CM

## 2022-02-12 MED ORDER — IBUPROFEN 800 MG PO TABS: 800.0000 mg | ORAL_TABLET | Freq: Three times a day (TID) | ORAL | 0 refills | Status: DC

## 2022-02-12 MED ORDER — DICYCLOMINE HCL 10 MG PO CAPS: 10.0000 mg | ORAL_CAPSULE | Freq: Three times a day (TID) | ORAL | 3 refills | Status: DC

## 2022-02-12 MED ORDER — SPIRONOLACTONE 100 MG PO TABS
100.0000 mg | ORAL_TABLET | ORAL | 2 refills | Status: DC
  Filled 2022-04-26: qty 30, 30d supply, fill #0

## 2022-02-12 MED ORDER — DROSPIRENONE-ETHINYL ESTRADIOL 3-0.03 MG PO TABS
1.0000 | ORAL_TABLET | Freq: Every day | ORAL | 11 refills | Status: DC
  Filled 2022-02-12 (×2): qty 28, 28d supply, fill #0
  Filled 2022-03-02: qty 84, 84d supply, fill #1
  Filled 2022-04-26 – 2022-05-03 (×2): qty 84, 84d supply, fill #2
  Filled 2022-05-08: qty 28, 28d supply, fill #2

## 2022-02-12 MED ORDER — BUDESONIDE 180 MCG/ACT IN AEPB: 2.0000 | INHALATION_SPRAY | Freq: Two times a day (BID) | RESPIRATORY_TRACT | 0 refills | Status: DC

## 2022-02-12 MED ORDER — DROSPIRENONE-ETHINYL ESTRADIOL 3-0.03 MG PO TABS: 1.0000 | ORAL_TABLET | Freq: Every day | ORAL | 11 refills | Status: DC

## 2022-02-12 MED ORDER — DICYCLOMINE HCL 20 MG PO TABS
10.0000 mg | ORAL_TABLET | Freq: Three times a day (TID) | ORAL | 5 refills | Status: DC
  Filled 2022-04-17 – 2022-04-26 (×2): qty 45, 30d supply, fill #0
  Filled 2022-08-10: qty 45, 30d supply, fill #1

## 2022-02-12 NOTE — Addendum Note (Signed)
Addended by: Haydee Salter on: 02/12/2022 04:41 PM ? ? Modules accepted: Orders ? ?

## 2022-02-13 ENCOUNTER — Other Ambulatory Visit (HOSPITAL_BASED_OUTPATIENT_CLINIC_OR_DEPARTMENT_OTHER): Payer: Self-pay

## 2022-02-19 ENCOUNTER — Ambulatory Visit: Payer: 59 | Admitting: Family Medicine

## 2022-02-19 ENCOUNTER — Other Ambulatory Visit (HOSPITAL_BASED_OUTPATIENT_CLINIC_OR_DEPARTMENT_OTHER): Payer: Self-pay

## 2022-02-19 VITALS — BP 114/68 | HR 85 | Temp 97.8°F | Ht 68.0 in | Wt 134.0 lb

## 2022-02-19 DIAGNOSIS — G479 Sleep disorder, unspecified: Secondary | ICD-10-CM

## 2022-02-19 DIAGNOSIS — G8929 Other chronic pain: Secondary | ICD-10-CM | POA: Diagnosis not present

## 2022-02-19 DIAGNOSIS — F418 Other specified anxiety disorders: Secondary | ICD-10-CM | POA: Diagnosis not present

## 2022-02-19 DIAGNOSIS — R5383 Other fatigue: Secondary | ICD-10-CM

## 2022-02-19 DIAGNOSIS — R109 Unspecified abdominal pain: Secondary | ICD-10-CM | POA: Diagnosis not present

## 2022-02-19 DIAGNOSIS — L659 Nonscarring hair loss, unspecified: Secondary | ICD-10-CM

## 2022-02-19 MED ORDER — AMPHETAMINE-DEXTROAMPHETAMINE 10 MG PO TABS
10.0000 mg | ORAL_TABLET | Freq: Every day | ORAL | 0 refills | Status: DC
  Filled 2022-02-19 – 2022-03-05 (×2): qty 30, 30d supply, fill #0

## 2022-02-19 MED ORDER — ESCITALOPRAM OXALATE 10 MG PO TABS
15.0000 mg | ORAL_TABLET | Freq: Every day | ORAL | 3 refills | Status: DC
  Filled 2022-02-19 – 2022-03-12 (×2): qty 135, 90d supply, fill #0
  Filled 2022-04-26 – 2022-08-03 (×2): qty 135, 90d supply, fill #1
  Filled 2022-10-30: qty 135, 90d supply, fill #2

## 2022-02-19 NOTE — Progress Notes (Signed)
?Tracy Gallegos ?Tracy Gallegos ?[place] ?Tracy Gallegos [address] ?Dept: [phone] ?Dept Fax: [phone] ? ?Chronic Gallegos Office Visit ? ?Subjective:  ? ? Patient ID: Tracy Gallegos, female    DOB: 24-May-1986, 36 y.o..   MRN: [id number] ? ?Chief Complaint  ?Patient presents with  ? Follow-up  ?  4 month f/u.  , referral to dermatology.  ? ? ?History of Present Illness: ? ?Patient is in today for reassessment of chronic medical issues. ? ?[name] has a history of depression with anxiety. She is managed by Dr. De Nurse (psychiatry) and is currently on escitalopram 15 mg daily. She feels her mood is improved. Since her last visit, her family has moved to [place], her husband has started his new job, and they now have an offer on their house in [place]. Her son,. Tracy Gallegos, did have a major infection issue (typhoid and norovirus). He is improved currently and seems to be well for now. She has found the reduction in stressors to have helped. She is not very happy with Dr. Nicole Cella, so asks that I take over managing her escitalopram for now. ?  ?Tracy Gallegos has a history of a sleep disorder, leading to daytime somnolence. She has been seen by Dr. Barnie Mort who treated her initially with Provigil to manage morning drowsiness. She is now on Adderall 5 mg bid and finds that this has helped considerably. She takes this about 3 days a week. ?  ?Tracy Gallegos has a history of hair loss. She is on spironolactone and wants to give it a fair trial of six months. At this point, she still feels she is having thinning of her hair. She is aware of other treatments and does plan to see a dermatologist about some of these options. ?  ?Tracy Gallegos has a history of chronic episodic abdominal pain. She is managed on Bentyl and has found that this helps considerably. This may represent IBS. ? ?Past Medical History: ?Patient Active Problem List  ? Diagnosis Date Noted  ? 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  Hair loss 10/16/2021  ? Chronic abdominal pain 08/02/2021  ? Fatigue 06/07/2019  ? Depression with anxiety 06/30/2018  ? History of gestational diabetes 06/30/2018  ? Sleep disorder, unspecified 06/30/2018  ? 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 Anti-D antibodies present during pregnancy 06-Oct-2017  ? 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 Attention deficit hyperactivity disorder 09/15/2014  ? Fibromyalgia 08/03/2013  ? ?Past Surgical History:  ?Procedure Laterality Date  ? OVARIAN CYST SURGERY    ? WISDOM TOOTH EXTRACTION    ? ?Family History  ?Problem Relation Age of Onset  ? Obesity Mother   ? Depression Mother   ? Alcohol abuse Mother   ? Heart disease Father   ? Early death Father   ? Obesity Brother   ? Alcohol abuse Maternal Uncle   ? Depression Maternal Uncle   ? Hypertension Maternal Grandmother   ? Depression Maternal Grandmother   ? Alcohol abuse Maternal Grandmother   ? Alzheimer's disease Maternal Grandmother   ? Depression Maternal Grandfather   ? Stroke Maternal Grandfather   ? Heart disease Maternal Grandfather   ? Suicidality Maternal Grandfather   ? Suicidality Paternal Grandmother   ? Depression Paternal Grandmother   ? Heart disease Paternal Grandfather   ? Early death Paternal Grandfather   ? ?Outpatient Medications Prior to Visit  ?Medication Sig Dispense Refill  ? dexamethasone (MAXIDEX) 0.1 % ophthalmic suspension Place 2 drops into both  ears 2 (two) times daily. 15 mL 1  ? dicyclomine (BENTYL) 20 MG tablet Take 0.5 tablets (10 mg total) by mouth 3 (three) times daily for abdominal pain. 45 tablet 5  ? drospirenone-ethinyl estradiol (YASMIN 28) 3-0.03 MG tablet Take 1 tablet by mouth daily. 28 tablet 11  ? ibuprofen (ADVIL) 800 MG tablet Take 1 tablet (800 mg total) by mouth 3 (three) times daily. 90 tablet 0  ? naproxen (NAPROSYN) 500 MG tablet Take 500 mg by mouth 2 (two) times daily.    ? spironolactone (ALDACTONE) 100 MG tablet Take 0.5 tablet (50 mg) by mouth daily for 7 days, then  take 1 tablet (100 mg) by mouth daily for 23 days. 30 tablet 2  ? amphetamine-dextroamphetamine (ADDERALL) 10 MG tablet Take 1 tablet (10 mg total) by mouth daily with breakfast. 30 tablet 0  ? budesonide (PULMICORT) 180 MCG/ACT inhaler Inhale 2 puffs into the lungs 2 (two) times daily. 1 each 0  ? dicyclomine (BENTYL) 10 MG capsule Take 10 mg by mouth 3 (three) times daily.    ? escitalopram (LEXAPRO) 10 MG tablet Take 1.5 tablets (15 mg total) by mouth daily. 135 tablet 0  ? spironolactone (ALDACTONE) 100 MG tablet Take 0.5 tablets (50 mg total) by mouth daily for 7 days, THEN 1 tablet (100 mg total) daily for 23 days. 30 tablet 2  ? dicyclomine (BENTYL) 10 MG capsule Take 1 capsule (10 mg total) by mouth 3 (three) times daily for abdominal pain. 90 capsule 3  ? ?No facility-administered medications prior to visit.  ? ?No Known Allergies ?   ?Objective:  ? ?Today's Vitals  ? 02/19/22 1552  ?BP: 114/68  ?Pulse: 85  ?Temp: 97.8 ?F (36.6 ?C)  ?TempSrc: Temporal  ?SpO2: 98%  ?Weight: 134 Tracy (60.8 kg)  ?Height: '5\' 8"'$  (1.727 m)  ? ?Body mass index is 20.37 kg/m?.  ? ?General: Well developed, well nourished. No acute distress. ?Psych: Alert and oriented. Normal mood and affect. ? ?Health Maintenance Due  ?Topic Date Due  ? Hepatitis C Screening  Never done  ? ? ?  02/19/2022  ?  4:19 PM 09/17/2021  ?  3:53 PM 08/23/2021  ? 11:25 AM  ?Depression screen PHQ 2/9  ?Decreased Interest 1    ?Down, Depressed, Hopeless 1    ?PHQ - 2 Score 2    ?Altered sleeping 0    ?Tired, decreased energy 3    ?Change in appetite 0    ?Feeling bad or failure about yourself  1    ?Trouble concentrating 0    ?Moving slowly or fidgety/restless 0    ?Suicidal thoughts 0    ?PHQ-9 Score 6    ?Difficult doing work/chores Somewhat difficult    ?  ? Information is confidential and restricted. Go to Review Flowsheets to unlock data.  ? ?Assessment & Plan:  ? ?1. Depression with anxiety ?Overall, Tracy Gallegos's mood issue appear to be well controlled. I  will plant o continue her Lexapro. ? ?- escitalopram (LEXAPRO) 10 MG tablet; Take 1.5 tablets (15 mg total) by mouth daily.  Dispense: 135 tablet; Refill: 3 ? ?2. Chronic abdominal pain ?Stable with periodic Bentyl use. Notes she needs this mostly when taking Adderall. ? ?3. Hair loss ?Continue spironolactone. ? ?4. Sleep disorder, unspecified ?5. Fatigue, unspecified type ?Stable with use about 3 days a week. ? ?- amphetamine-dextroamphetamine (ADDERALL) 10 MG tablet; Take 1 tablet (10 mg total) by mouth daily with breakfast.  Dispense: 30 tablet;  Refill: 0 ? ? ?Return in about 4 months (around 06/21/2022) for Reassessment.  ? ?Haydee Salter, MD ?

## 2022-02-21 ENCOUNTER — Other Ambulatory Visit: Payer: Self-pay | Admitting: Family Medicine

## 2022-02-21 DIAGNOSIS — R55 Syncope and collapse: Secondary | ICD-10-CM | POA: Diagnosis not present

## 2022-02-21 DIAGNOSIS — R0789 Other chest pain: Secondary | ICD-10-CM | POA: Diagnosis not present

## 2022-02-21 DIAGNOSIS — I493 Ventricular premature depolarization: Secondary | ICD-10-CM | POA: Diagnosis not present

## 2022-02-21 DIAGNOSIS — I471 Supraventricular tachycardia: Secondary | ICD-10-CM

## 2022-02-21 DIAGNOSIS — E860 Dehydration: Secondary | ICD-10-CM | POA: Diagnosis not present

## 2022-02-21 DIAGNOSIS — Z20822 Contact with and (suspected) exposure to covid-19: Secondary | ICD-10-CM | POA: Diagnosis not present

## 2022-02-21 DIAGNOSIS — R791 Abnormal coagulation profile: Secondary | ICD-10-CM | POA: Diagnosis not present

## 2022-02-21 DIAGNOSIS — R509 Fever, unspecified: Secondary | ICD-10-CM | POA: Diagnosis not present

## 2022-02-21 DIAGNOSIS — R000 Tachycardia, unspecified: Secondary | ICD-10-CM | POA: Diagnosis not present

## 2022-02-21 DIAGNOSIS — B349 Viral infection, unspecified: Secondary | ICD-10-CM | POA: Diagnosis not present

## 2022-02-21 MED ORDER — METOPROLOL SUCCINATE ER 25 MG PO TB24: 25.0000 mg | ORAL_TABLET | Freq: Every day | ORAL | 3 refills | Status: DC

## 2022-02-21 NOTE — Progress Notes (Signed)
Husband called to report the patient had an acute episode of PSVT that involved a syncopal event.  She was rushed to the hospital and evaluated.  Improving.  She had taken metoprolol XL 25 mg daily for her history of PSVT.  Her doctor tried to cycle her off of it and she had done well until today.  She taken this medication in the past without issue.  She is away visiting her mother.  Prescription for metoprolol XL 25 mg was sent to the pharmacy. ?

## 2022-02-22 ENCOUNTER — Other Ambulatory Visit (HOSPITAL_BASED_OUTPATIENT_CLINIC_OR_DEPARTMENT_OTHER): Payer: Self-pay

## 2022-02-26 DIAGNOSIS — Z681 Body mass index (BMI) 19 or less, adult: Secondary | ICD-10-CM | POA: Diagnosis not present

## 2022-02-26 DIAGNOSIS — Z113 Encounter for screening for infections with a predominantly sexual mode of transmission: Secondary | ICD-10-CM | POA: Diagnosis not present

## 2022-02-26 DIAGNOSIS — Z01419 Encounter for gynecological examination (general) (routine) without abnormal findings: Secondary | ICD-10-CM | POA: Diagnosis not present

## 2022-02-26 DIAGNOSIS — Z124 Encounter for screening for malignant neoplasm of cervix: Secondary | ICD-10-CM | POA: Diagnosis not present

## 2022-02-26 DIAGNOSIS — Z01411 Encounter for gynecological examination (general) (routine) with abnormal findings: Secondary | ICD-10-CM | POA: Diagnosis not present

## 2022-02-28 ENCOUNTER — Other Ambulatory Visit (HOSPITAL_BASED_OUTPATIENT_CLINIC_OR_DEPARTMENT_OTHER): Payer: Self-pay

## 2022-02-28 ENCOUNTER — Encounter (HOSPITAL_BASED_OUTPATIENT_CLINIC_OR_DEPARTMENT_OTHER): Payer: Self-pay

## 2022-03-01 ENCOUNTER — Other Ambulatory Visit (HOSPITAL_BASED_OUTPATIENT_CLINIC_OR_DEPARTMENT_OTHER): Payer: Self-pay

## 2022-03-04 ENCOUNTER — Other Ambulatory Visit (HOSPITAL_BASED_OUTPATIENT_CLINIC_OR_DEPARTMENT_OTHER): Payer: Self-pay

## 2022-03-05 ENCOUNTER — Other Ambulatory Visit (HOSPITAL_BASED_OUTPATIENT_CLINIC_OR_DEPARTMENT_OTHER): Payer: Self-pay

## 2022-03-07 LAB — HM PAP SMEAR

## 2022-03-12 ENCOUNTER — Other Ambulatory Visit (HOSPITAL_BASED_OUTPATIENT_CLINIC_OR_DEPARTMENT_OTHER): Payer: Self-pay

## 2022-03-21 NOTE — Progress Notes (Signed)
? ?Virtual Visit via Video Note  ? ?This visit type was conducted due to national recommendations for restrictions regarding the COVID-19 Pandemic (e.g. social distancing) in an effort to limit this patient's exposure and mitigate transmission in our community.  Due to her co-morbid illnesses, this patient is at least at moderate risk for complications without adequate follow up.  This format is felt to be most appropriate for this patient at this time.  All issues noted in this document were discussed and addressed.  A limited physical exam was performed with this format.  Please refer to the patient's chart for her consent to telehealth for [facility]. ? ?   ? ?Date:  03/22/2022  ? ?ID:  Tracy Gallegos, DOB 1985-12-26, MRN [id number] ? ?Patient Location: Home ?Provider Location: Office/Clinic ? ?PCP:  Haydee Salter, MD  ?Cardiologist:  Rozann Lesches, MD  ?Electrophysiologist:  None  ? ?Evaluation Performed:  Follow-Up Visit ? ?Chief Complaint:  Rapid Heart Rate ? ?History of Present Illness:   ? ?Tracy Gallegos is a 36 y.o. female with history of palpitations, with cardiac monitor revealing sinus rhythm with normal heart rate variation only rare PACs and PVCs without arrhythmias.  She has a prior history of depression, endometriosis, and gestational diabetes.  She is on Adderall, Prozac, and Loestrin daily.  Last seen by Dr. Domenic Polite on 04/18/2021, who did not make any new recommendations for new testing, changes in medications, favoring lifestyle modifications, stress reduction, return to exercise, and cutting back on caffeine.She did follow-up and leave a message for Dr. Domenic Polite in July 2022 with complaints of a rapid heart rhythm.  He felt it might be related to PSVT which resolved on its own and therefore he prescribed metoprolol 25 mg (1/2 tablet daily). ? ?She states that she stopped taking the Adderal and the Metoprolol for the last two weeks. She states that the metoprolol made her too  tired.  She is also carrying for 3 children one of which is special needs. She state she is exhausted. She has been changed on her SSRI from Prozac to Citalopram. She still feeds some palpitations but no rapid HR.  ? ?Past Medical History  ? ? ?The patient does not have symptoms concerning for COVID-19 infection (fever, chills, cough, or new shortness of breath).  ? ? ?Past Medical History:  ?Diagnosis Date  ? Depression   ? Endometriosis   ? History of gestational diabetes 01/23/2018  ? Ovarian cyst   ? Bilateral  ? ?Past Surgical History:  ?Procedure Laterality Date  ? OVARIAN CYST SURGERY    ? WISDOM TOOTH EXTRACTION    ?  ? ?No outpatient medications have been marked as taking for the 03/22/22 encounter (Appointment) with Lendon Colonel, NP.  ?  ? ?Allergies:   Patient has no known allergies.  ? ?Social History  ? ?Tobacco Use  ? Smoking status: Never  ? Smokeless tobacco: Never  ?Vaping Use  ? Vaping Use: Never used  ?Substance Use Topics  ? Alcohol use: No  ? Drug use: No  ?  ? ?Family Hx: ?The patient's family history includes Alcohol abuse in her maternal grandmother, maternal uncle, and mother; Alzheimer's disease in her maternal grandmother; Depression in her maternal grandfather, maternal grandmother, maternal uncle, mother, and paternal grandmother; Early death in her father and paternal grandfather; Heart disease in her father, maternal grandfather, and paternal grandfather; Hypertension in her maternal grandmother; Obesity in her brother and mother; Stroke in her maternal grandfather;  Suicidality in her maternal grandfather and paternal grandmother. ? ?ROS:   ?Please see the history of present illness.    ?All other systems reviewed and are negative. ? ? ?Prior CV studies:   ?The following studies were reviewed today: ? ?Echocardiogram 08/02/2021  ?1. Left ventricular ejection fraction, by estimation, is 60 to 65%. The  ?left ventricle has normal function. The left ventricle has no regional  ?wall  motion abnormalities. Left ventricular diastolic parameters were  ?normal.  ? 2. Right ventricular systolic function is normal. The right ventricular  ?size is normal. Tricuspid regurgitation signal is inadequate for assessing  ?PA pressure.  ? 3. The mitral valve is normal in structure. No evidence of mitral valve  ?regurgitation. No evidence of mitral stenosis.  ? 4. The aortic valve is tricuspid. Aortic valve regurgitation is not  ?visualized. No aortic stenosis is present.  ? 5. The inferior vena cava is normal in size with greater than 50%  ?respiratory variability, suggesting right atrial pressure of 3 mmHg.  ? ? ?Labs/Other Tests and Data Reviewed:   ? ?EKG:  No ECG reviewed. ? ?Recent Labs: ?No results found for requested labs within last 8760 hours.  ? ?Recent Lipid Panel ?No results found for: CHOL, TRIG, HDL, CHOLHDL, LDLCALC, LDLDIRECT ? ?Wt Readings from Last 3 Encounters:  ?02/19/22 134 lb (60.8 kg)  ?10/16/21 137 lb 9.6 oz (62.4 kg)  ?08/02/21 138 lb 12.8 oz (63 kg)  ?  ? ?Objective:   ? ?Vital Signs:  There were no vitals taken for this visit.  ? ?VITAL SIGNS:  reviewed ?GEN:  no acute distress ?EYES:  sclerae anicteric, EOMI - Extraocular Movements Intact ?RESPIRATORY:  normal respiratory effort, symmetric expansion ?MUSCULOSKELETAL:  no obvious deformities. ?PSYCH:  Very anxious and distracted by young children in the room.  ? ?ASSESSMENT & PLAN:   ? ?PSVT:  She has stopped the Adderal temporarily, but plans to go back on it.  She states that the metoprolol made her too tired when she took it during the day.  I have advised her to take the metoprolol 12.5 at night instead of during the day to avoid daytime fatigue. She is willing to try this new timing.  ? ?2. Depression and Anxiety: Has has had change in SSRI from Prozac to Citalopram. When on Prozac, she did not have palpitations or PSVT.  She may need to talk to her PCP about this, if she is experiencing worsening symptoms despite use of  metoprolol. Patient left the call because urgent care of a child was needed.  ? ?COVID-19 Education: ?The signs and symptoms of COVID-19 were discussed with the patient and how to seek care for testing (follow up with PCP or arrange E-visit).  The importance of social distancing was discussed today. ? ?Time:   ?Today, I have spent 15 minutes with the patient with telehealth technology discussing the above problems.   ? ? ?Medication Adjustments/Labs and Tests Ordered: ?Current medicines are reviewed at length with the patient today.  Concerns regarding medicines are outlined above.  ? ?Tests Ordered: ?No orders of the defined types were placed in this encounter. ? ? ?Medication Changes: ?No orders of the defined types were placed in this encounter. ? ? ?Disposition:  Follow up in 1 month(s) ? ?Signed, ?Phill Myron. [place] Pugh, ANP, AACC ? ?03/22/2022 12:38 PM    ?[place] ?  ?

## 2022-03-22 ENCOUNTER — Encounter: Payer: Self-pay | Admitting: Adult Health

## 2022-03-22 ENCOUNTER — Telehealth (INDEPENDENT_AMBULATORY_CARE_PROVIDER_SITE_OTHER): Payer: 59 | Admitting: Adult Health

## 2022-03-22 VITALS — Ht 68.0 in | Wt 134.0 lb

## 2022-03-22 DIAGNOSIS — R002 Palpitations: Secondary | ICD-10-CM | POA: Diagnosis not present

## 2022-03-22 DIAGNOSIS — I471 Supraventricular tachycardia: Secondary | ICD-10-CM | POA: Diagnosis not present

## 2022-03-22 DIAGNOSIS — F418 Other specified anxiety disorders: Secondary | ICD-10-CM | POA: Diagnosis not present

## 2022-03-22 NOTE — Patient Instructions (Addendum)
Medication Instructions:  ?Your physician recommends that you continue on your current medications as directed. Please refer to the Current Medication list given to you today.  ? ?If you continue the Adderall, take Metoprolol 12.5 mg at night.  ? ?*If you need a refill on your cardiac medications before your next appointment, please call your pharmacy* ? ? ?Lab Work: ?NONE ordered at this time of appointment  ? ?If you have labs (blood work) drawn today and your tests are completely normal, you will receive your results only by: ?MyChart Message (if you have MyChart) OR ?A paper copy in the mail ?If you have any lab test that is abnormal or we need to change your treatment, we will call you to review the results. ? ? ?Testing/Procedures: ?NONE ordered at this time of appointment  ? ? ? ?Follow-Up: ?At [facility], you and your health needs are our priority.  As part of our continuing mission to provide you with exceptional heart care, we have created designated Provider Care Teams.  These Care Teams include your primary Cardiologist (physician) and Advanced Practice Providers (APPs -  Physician Assistants and Nurse Practitioners) who all work together to provide you with the care you need, when you need it. ? ?We recommend signing up for the patient portal called "MyChart".  Sign up information is provided on this After Visit Summary.  MyChart is used to connect with patients for Virtual Visits (Telemedicine).  Patients are able to view lab/test results, encounter notes, upcoming appointments, etc.  Non-urgent messages can be sent to your provider as well.   ?To learn more about what you can do with MyChart, go to [email or web address].   ? ?Your next appointment:   ?1 month(s) ? ?The format for your next appointment:   ?In Person ? ?Provider:   ?Rozann Lesches, MD  or Jory Sims, DNP, ANP      ? ? ?Other Instructions ? ? ?Important Information About Sugar ? ? ? ? ? ? ?

## 2022-03-26 DIAGNOSIS — R109 Unspecified abdominal pain: Secondary | ICD-10-CM | POA: Diagnosis not present

## 2022-03-26 DIAGNOSIS — K625 Hemorrhage of anus and rectum: Secondary | ICD-10-CM | POA: Diagnosis not present

## 2022-03-29 ENCOUNTER — Ambulatory Visit: Payer: 59 | Admitting: Family Medicine

## 2022-04-03 ENCOUNTER — Encounter: Payer: Self-pay | Admitting: Family Medicine

## 2022-04-03 DIAGNOSIS — H5213 Myopia, bilateral: Secondary | ICD-10-CM

## 2022-04-10 ENCOUNTER — Ambulatory Visit: Payer: 59 | Admitting: Family Medicine

## 2022-04-10 ENCOUNTER — Encounter: Payer: Self-pay | Admitting: Family Medicine

## 2022-04-10 ENCOUNTER — Other Ambulatory Visit (HOSPITAL_BASED_OUTPATIENT_CLINIC_OR_DEPARTMENT_OTHER): Payer: Self-pay

## 2022-04-10 VITALS — BP 118/68 | HR 82 | Temp 96.9°F | Ht 68.0 in | Wt 134.2 lb

## 2022-04-10 DIAGNOSIS — G479 Sleep disorder, unspecified: Secondary | ICD-10-CM

## 2022-04-10 DIAGNOSIS — L299 Pruritus, unspecified: Secondary | ICD-10-CM | POA: Diagnosis not present

## 2022-04-10 DIAGNOSIS — L988 Other specified disorders of the skin and subcutaneous tissue: Secondary | ICD-10-CM

## 2022-04-10 MED ORDER — TRETINOIN 0.05 % EX CREA
TOPICAL_CREAM | Freq: Every day | CUTANEOUS | 0 refills | Status: DC
  Filled 2022-04-10 – 2022-04-17 (×2): qty 45, 30d supply, fill #0

## 2022-04-10 MED ORDER — TRAZODONE HCL 50 MG PO TABS
25.0000 mg | ORAL_TABLET | Freq: Every evening | ORAL | 3 refills | Status: DC | PRN
  Filled 2022-04-10 – 2022-04-26 (×2): qty 30, 60d supply, fill #0
  Filled 2022-10-30: qty 30, 60d supply, fill #1

## 2022-04-10 NOTE — Progress Notes (Signed)
[facility] [address] [place] [address] Dept: [phone] Dept Fax: [phone]  Office Visit  Subjective:    Patient ID: Tracy Gallegos, female    DOB: June 21, 1986, 36 y.o..   MRN: [id number]  Chief Complaint  Patient presents with   Acute Visit    C/o having ear itching, sleeping issues, and would like cream for wrinkles. Referral to Dr The (ENT).      History of Present Illness:  Tracy Gallegos complains of ongoing itchiness in her ears. She has a history of scaliness and itching in both ear canals. She was previously prescribed Ciprodex, but she does not like to be on episodic antibiotics. In December, we had switched her to dexamethasone drops. She finds this is not really managing her symptoms. She asks about an ENT referral to assess.  Tracy Gallegos has a history of a sleep disorder, leading to daytime somnolence. She has been seen by Dr. Barnie Mort who treated her initially with Provigil to manage morning drowsiness. She is now on Adderall 5 mg bid and finds that this has helped considerably. She takes this about 3 days a week. She notes she is having issues with initiating sleep. Her husband is currently on trazodone. She had tried this and did find it helpful. She had used a 25 mg dose. She has tried doxylamine in the past, but this causes her leg cramps, as do other 1st generation antihistamines.  Tracy Gallegos has concerns about the development of fine wrinkles on her face. She asks about being prescribed tretinoin for this. She was treated with both Retin-A and Accutane in her younger years for acne issues.   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   Anti-D antibodies present during pregnancy 10-18-2017   Primary female infertility 09/15/2014   Fibromyalgia 08/03/2013   Past Surgical History:  Procedure Laterality Date   OVARIAN CYST SURGERY     WISDOM TOOTH EXTRACTION     Family History  Problem Relation Age of Onset   Obesity Mother    Depression Mother    Alcohol abuse Mother    Heart disease Father    Early death Father    Obesity Brother    Alcohol abuse Maternal Uncle    Depression Maternal Uncle    Hypertension Maternal Grandmother    Depression Maternal Grandmother    Alcohol abuse Maternal Grandmother    Alzheimer's disease Maternal Grandmother    Depression Maternal Grandfather    Stroke Maternal Grandfather    Heart disease Maternal Grandfather    Suicidality Maternal Grandfather    Suicidality Paternal Grandmother    Depression Paternal Grandmother    Heart disease Paternal Grandfather    Early death Paternal Grandfather    Outpatient Medications Prior to Visit  Medication Sig Dispense Refill   amphetamine-dextroamphetamine (ADDERALL) 10 MG tablet Take 1 tablet (10 mg total) by mouth daily with breakfast. (Patient taking differently: Take 10 mg by mouth daily with breakfast. As needed daily) 30 tablet 0   cetirizine (ZYRTEC) 10 MG tablet Take 10 mg by mouth daily. Daily as needed     dexamethasone (MAXIDEX) 0.1 % ophthalmic suspension Place 2 drops into both  ears 2 (two) times daily. 15 mL 1   dicyclomine (BENTYL) 20 MG tablet Take 0.5 tablets (10 mg total) by mouth 3 (three) times daily for abdominal pain. (Patient taking differently: Take 10 mg by mouth 3 (three) times daily. Takes up to three times a day as needed) 45 tablet 5   drospirenone-ethinyl estradiol (YASMIN 28) 3-0.03 MG tablet Take 1 tablet by mouth daily. 28 tablet 11   escitalopram (LEXAPRO) 10 MG tablet Take 1.5 tablets (15 mg total) by mouth daily. 135 tablet 3   loratadine (CLARITIN) 10 MG tablet Take 10  mg by mouth daily as needed for allergies.     metoprolol succinate (TOPROL-XL) 25 MG 24 hr tablet Take 1 tablet (25 mg total) by mouth daily. (Patient taking differently: Take 12.5 mg by mouth every evening.) 90 tablet 3   naproxen (NAPROSYN) 500 MG tablet Take 500 mg by mouth as needed. Takes as needed only     spironolactone (ALDACTONE) 100 MG tablet Take 0.5 tablet (50 mg) by mouth daily for 7 days, then take 1 tablet (100 mg) by mouth daily for 23 days. (Patient taking differently: Take 100 mg by mouth. Taking 50 mg daily) 30 tablet 2   ibuprofen (ADVIL) 800 MG tablet Take 1 tablet (800 mg total) by mouth 3 (three) times daily. (Patient taking differently: Take 800 mg by mouth every 8 (eight) hours as needed.) 90 tablet 0   No facility-administered medications prior to visit.   No Known Allergies    Objective:   Today's Vitals   04/10/22 1023  BP: 118/68  Pulse: 82  Temp: (!) 96.9 F (36.1 C)  TempSrc: Temporal  SpO2: 96%  Weight: 134 lb 3.2 oz (60.9 kg)  Height: '5\' 8"'$  (1.727 m)   Body mass index is 20.41 kg/m.   General: Well developed, well nourished. No acute distress. HEENT: Normocephalic, non-traumatic. External ears normal. EAC and TMs normal bilaterally.  Skin: Warm and dry. No rashes. There are multiple fine wrinkles across the forehead. Psych: Alert and oriented. Normal mood and affect.  Health Maintenance Due  Topic Date Due   Hepatitis C Screening  Never done     Assessment & Plan:   1. Ear itching I am still suspicious of seborrhea as the underlying issue, though I do not see any scaliness in the ears today. I will go ahead with referral to ENT for an assessment and recommendation on management.  - Ambulatory referral to ENT  2. Sleep disorder, unspecified As Tracy Gallegos is responding to her husband's trazodone, I will go ahead and prescribe this for her personal use.  - traZODone (DESYREL) 50 MG tablet; Take 0.5 tablets (25 mg total) by mouth at bedtime  as needed for sleep.  Dispense: 30 tablet; Refill: 3  3. Forehead wrinkles I will prescribe a low dose tretinoin cream for management of the fine wrinkles on her face. She plans ot mix this with her usual facial lotion and use once a day.  - tretinoin (RETIN-A) 0.05 % cream; Apply topically at bedtime.  Dispense: 45 g; Refill: 0   Return if symptoms worsen or fail to improve.   Haydee Salter, MD

## 2022-04-11 ENCOUNTER — Telehealth: Payer: Self-pay

## 2022-04-11 ENCOUNTER — Ambulatory Visit: Payer: BC Managed Care – PPO | Admitting: Cardiology

## 2022-04-11 ENCOUNTER — Other Ambulatory Visit (HOSPITAL_BASED_OUTPATIENT_CLINIC_OR_DEPARTMENT_OTHER): Payer: Self-pay

## 2022-04-11 NOTE — Telephone Encounter (Signed)
PA for Tretinion 0/05% cream submitted through cover my meds to [employer].  Awaiting response. Dm/cma  Key: [id number] Cota

## 2022-04-15 ENCOUNTER — Encounter (HOSPITAL_BASED_OUTPATIENT_CLINIC_OR_DEPARTMENT_OTHER): Payer: Self-pay

## 2022-04-15 ENCOUNTER — Other Ambulatory Visit (HOSPITAL_BASED_OUTPATIENT_CLINIC_OR_DEPARTMENT_OTHER): Payer: Self-pay

## 2022-04-16 NOTE — Telephone Encounter (Signed)
PA denied by [employer],  Dm/cma

## 2022-04-17 ENCOUNTER — Other Ambulatory Visit (HOSPITAL_BASED_OUTPATIENT_CLINIC_OR_DEPARTMENT_OTHER): Payer: Self-pay

## 2022-04-17 ENCOUNTER — Other Ambulatory Visit (HOSPITAL_COMMUNITY): Payer: Self-pay

## 2022-04-24 ENCOUNTER — Other Ambulatory Visit (HOSPITAL_COMMUNITY): Payer: Self-pay

## 2022-04-26 ENCOUNTER — Other Ambulatory Visit (HOSPITAL_BASED_OUTPATIENT_CLINIC_OR_DEPARTMENT_OTHER): Payer: Self-pay

## 2022-04-26 ENCOUNTER — Other Ambulatory Visit (HOSPITAL_COMMUNITY): Payer: Self-pay

## 2022-04-26 ENCOUNTER — Ambulatory Visit: Payer: BC Managed Care – PPO | Admitting: Adult Health

## 2022-04-30 ENCOUNTER — Other Ambulatory Visit (HOSPITAL_BASED_OUTPATIENT_CLINIC_OR_DEPARTMENT_OTHER): Payer: Self-pay

## 2022-05-04 ENCOUNTER — Other Ambulatory Visit (HOSPITAL_COMMUNITY): Payer: Self-pay

## 2022-05-06 ENCOUNTER — Other Ambulatory Visit (HOSPITAL_COMMUNITY): Payer: Self-pay

## 2022-05-08 ENCOUNTER — Other Ambulatory Visit (HOSPITAL_BASED_OUTPATIENT_CLINIC_OR_DEPARTMENT_OTHER): Payer: Self-pay

## 2022-05-08 ENCOUNTER — Other Ambulatory Visit: Payer: Self-pay | Admitting: Family Medicine

## 2022-05-08 ENCOUNTER — Encounter: Payer: Self-pay | Admitting: Family Medicine

## 2022-05-08 DIAGNOSIS — Z3041 Encounter for surveillance of contraceptive pills: Secondary | ICD-10-CM

## 2022-05-09 NOTE — Telephone Encounter (Signed)
Pharmacy comment: please send new rx with continuous dosing directions  Please review and advise. Thanks. Dm/cma

## 2022-05-17 DIAGNOSIS — F331 Major depressive disorder, recurrent, moderate: Secondary | ICD-10-CM | POA: Diagnosis not present

## 2022-05-20 ENCOUNTER — Other Ambulatory Visit (HOSPITAL_BASED_OUTPATIENT_CLINIC_OR_DEPARTMENT_OTHER): Payer: Self-pay

## 2022-05-21 ENCOUNTER — Other Ambulatory Visit: Payer: Self-pay | Admitting: Family Medicine

## 2022-05-21 ENCOUNTER — Other Ambulatory Visit (HOSPITAL_BASED_OUTPATIENT_CLINIC_OR_DEPARTMENT_OTHER): Payer: Self-pay

## 2022-05-21 DIAGNOSIS — Z3041 Encounter for surveillance of contraceptive pills: Secondary | ICD-10-CM

## 2022-05-21 NOTE — Telephone Encounter (Signed)
Pharmacy comment: please send new rx with continuous dosing directions, thank you!  Please review and advise.  Thanks. Dm/cma

## 2022-05-24 ENCOUNTER — Other Ambulatory Visit (HOSPITAL_BASED_OUTPATIENT_CLINIC_OR_DEPARTMENT_OTHER): Payer: Self-pay

## 2022-05-24 MED ORDER — DROSPIRENONE-ETHINYL ESTRADIOL 3-0.03 MG PO TABS
1.0000 | ORAL_TABLET | Freq: Every day | ORAL | 11 refills | Status: DC
  Filled 2022-05-24: qty 28, 28d supply, fill #0
  Filled 2022-05-24 – 2022-06-10 (×2): qty 112, 84d supply, fill #0

## 2022-05-25 DIAGNOSIS — Z6820 Body mass index (BMI) 20.0-20.9, adult: Secondary | ICD-10-CM | POA: Diagnosis not present

## 2022-05-25 DIAGNOSIS — J029 Acute pharyngitis, unspecified: Secondary | ICD-10-CM | POA: Diagnosis not present

## 2022-05-27 ENCOUNTER — Other Ambulatory Visit (HOSPITAL_BASED_OUTPATIENT_CLINIC_OR_DEPARTMENT_OTHER): Payer: Self-pay

## 2022-05-27 ENCOUNTER — Telehealth: Payer: Self-pay | Admitting: Family Medicine

## 2022-05-27 NOTE — Telephone Encounter (Signed)
Provided Hallie from [facility] a verbal order for continuous use of Yasmin BC.

## 2022-05-27 NOTE — Telephone Encounter (Signed)
Cornelia at [facility]  is calling to get clarification with pt's Rx #: [id number]  drospirenone-ethinyl estradiol (YASMIN 28) 3-0.03 MG tablet [[id number]]  script. She is needing to know if the directions should say continuous. You can leave a verbal order at Phone:  [phone]

## 2022-05-29 DIAGNOSIS — N854 Malposition of uterus: Secondary | ICD-10-CM | POA: Diagnosis not present

## 2022-05-29 DIAGNOSIS — Z3202 Encounter for pregnancy test, result negative: Secondary | ICD-10-CM | POA: Diagnosis not present

## 2022-05-29 DIAGNOSIS — Z30430 Encounter for insertion of intrauterine contraceptive device: Secondary | ICD-10-CM | POA: Diagnosis not present

## 2022-05-29 DIAGNOSIS — N809 Endometriosis, unspecified: Secondary | ICD-10-CM | POA: Diagnosis not present

## 2022-06-06 DIAGNOSIS — F331 Major depressive disorder, recurrent, moderate: Secondary | ICD-10-CM | POA: Diagnosis not present

## 2022-06-10 ENCOUNTER — Other Ambulatory Visit (HOSPITAL_BASED_OUTPATIENT_CLINIC_OR_DEPARTMENT_OTHER): Payer: Self-pay

## 2022-06-10 ENCOUNTER — Other Ambulatory Visit (HOSPITAL_COMMUNITY): Payer: Self-pay

## 2022-06-21 ENCOUNTER — Encounter: Payer: 59 | Admitting: Family Medicine

## 2022-06-24 ENCOUNTER — Other Ambulatory Visit (HOSPITAL_COMMUNITY): Payer: Self-pay

## 2022-06-26 DIAGNOSIS — K625 Hemorrhage of anus and rectum: Secondary | ICD-10-CM | POA: Diagnosis not present

## 2022-06-26 DIAGNOSIS — K648 Other hemorrhoids: Secondary | ICD-10-CM | POA: Diagnosis not present

## 2022-06-26 DIAGNOSIS — D128 Benign neoplasm of rectum: Secondary | ICD-10-CM | POA: Diagnosis not present

## 2022-06-26 DIAGNOSIS — K589 Irritable bowel syndrome without diarrhea: Secondary | ICD-10-CM | POA: Diagnosis not present

## 2022-06-26 DIAGNOSIS — R1030 Lower abdominal pain, unspecified: Secondary | ICD-10-CM | POA: Diagnosis not present

## 2022-07-10 DIAGNOSIS — F331 Major depressive disorder, recurrent, moderate: Secondary | ICD-10-CM | POA: Diagnosis not present

## 2022-07-23 ENCOUNTER — Other Ambulatory Visit: Payer: Self-pay | Admitting: Family Medicine

## 2022-07-23 ENCOUNTER — Other Ambulatory Visit (HOSPITAL_COMMUNITY): Payer: Self-pay

## 2022-07-23 DIAGNOSIS — L219 Seborrheic dermatitis, unspecified: Secondary | ICD-10-CM

## 2022-07-23 MED ORDER — MAXIDEX 0.1 % OP SUSP
2.0000 [drp] | Freq: Two times a day (BID) | OPHTHALMIC | 1 refills | Status: DC
  Filled 2022-07-23: qty 15, 38d supply, fill #0

## 2022-07-24 DIAGNOSIS — F331 Major depressive disorder, recurrent, moderate: Secondary | ICD-10-CM | POA: Diagnosis not present

## 2022-07-30 ENCOUNTER — Other Ambulatory Visit (HOSPITAL_COMMUNITY): Payer: Self-pay

## 2022-07-30 ENCOUNTER — Encounter: Payer: Self-pay | Admitting: Family Medicine

## 2022-07-30 ENCOUNTER — Other Ambulatory Visit: Payer: Self-pay | Admitting: Family Medicine

## 2022-07-30 DIAGNOSIS — L219 Seborrheic dermatitis, unspecified: Secondary | ICD-10-CM

## 2022-07-31 ENCOUNTER — Other Ambulatory Visit (HOSPITAL_COMMUNITY): Payer: Self-pay

## 2022-08-02 DIAGNOSIS — R6882 Decreased libido: Secondary | ICD-10-CM | POA: Diagnosis not present

## 2022-08-02 DIAGNOSIS — Z30431 Encounter for routine checking of intrauterine contraceptive device: Secondary | ICD-10-CM | POA: Diagnosis not present

## 2022-08-02 DIAGNOSIS — R350 Frequency of micturition: Secondary | ICD-10-CM | POA: Diagnosis not present

## 2022-08-02 DIAGNOSIS — N941 Unspecified dyspareunia: Secondary | ICD-10-CM | POA: Diagnosis not present

## 2022-08-03 ENCOUNTER — Other Ambulatory Visit (HOSPITAL_COMMUNITY): Payer: Self-pay

## 2022-08-10 ENCOUNTER — Other Ambulatory Visit (HOSPITAL_COMMUNITY): Payer: Self-pay

## 2022-08-16 ENCOUNTER — Other Ambulatory Visit (HOSPITAL_BASED_OUTPATIENT_CLINIC_OR_DEPARTMENT_OTHER): Payer: Self-pay

## 2022-08-16 DIAGNOSIS — H608X3 Other otitis externa, bilateral: Secondary | ICD-10-CM | POA: Diagnosis not present

## 2022-08-16 MED ORDER — MOMETASONE FUROATE 0.1 % EX CREA
TOPICAL_CREAM | Freq: Every day | CUTANEOUS | 5 refills | Status: DC | PRN
  Filled 2022-08-16: qty 15, 30d supply, fill #0

## 2022-08-16 MED ORDER — PREDNISOLONE ACETATE 1 % OP SUSP
4.0000 [drp] | OPHTHALMIC | 5 refills | Status: AC | PRN
  Filled 2022-08-16: qty 5, 7d supply, fill #0
  Filled 2022-12-26: qty 5, 7d supply, fill #1

## 2022-08-21 DIAGNOSIS — R350 Frequency of micturition: Secondary | ICD-10-CM | POA: Diagnosis not present

## 2022-08-21 DIAGNOSIS — N941 Unspecified dyspareunia: Secondary | ICD-10-CM | POA: Diagnosis not present

## 2022-08-22 ENCOUNTER — Other Ambulatory Visit (HOSPITAL_BASED_OUTPATIENT_CLINIC_OR_DEPARTMENT_OTHER): Payer: Self-pay

## 2022-08-22 MED ORDER — MIRABEGRON ER 25 MG PO TB24
25.0000 mg | ORAL_TABLET | Freq: Every day | ORAL | 3 refills | Status: DC
  Filled 2022-08-22 – 2023-01-19 (×2): qty 30, 30d supply, fill #0

## 2022-09-09 ENCOUNTER — Other Ambulatory Visit (HOSPITAL_BASED_OUTPATIENT_CLINIC_OR_DEPARTMENT_OTHER): Payer: Self-pay

## 2022-09-18 DIAGNOSIS — K589 Irritable bowel syndrome without diarrhea: Secondary | ICD-10-CM | POA: Diagnosis not present

## 2022-09-18 DIAGNOSIS — Z86010 Personal history of colonic polyps: Secondary | ICD-10-CM | POA: Diagnosis not present

## 2022-09-18 DIAGNOSIS — K5900 Constipation, unspecified: Secondary | ICD-10-CM | POA: Diagnosis not present

## 2022-09-21 ENCOUNTER — Other Ambulatory Visit: Payer: Self-pay | Admitting: Family Medicine

## 2022-09-21 DIAGNOSIS — R5383 Other fatigue: Secondary | ICD-10-CM

## 2022-09-21 DIAGNOSIS — G479 Sleep disorder, unspecified: Secondary | ICD-10-CM

## 2022-09-23 ENCOUNTER — Other Ambulatory Visit (HOSPITAL_BASED_OUTPATIENT_CLINIC_OR_DEPARTMENT_OTHER): Payer: Self-pay

## 2022-09-23 MED ORDER — AMPHETAMINE-DEXTROAMPHETAMINE 10 MG PO TABS
10.0000 mg | ORAL_TABLET | Freq: Every day | ORAL | 0 refills | Status: DC
  Filled 2022-09-23: qty 30, 30d supply, fill #0

## 2022-09-23 NOTE — Telephone Encounter (Signed)
Refill request for  Adderall  10 mg  LR 02/19/22, #30, 0 ref LOV 02/19/22 FOV 10/11/22  Please review and advise.  Thanks. Dm/cma

## 2022-10-10 DIAGNOSIS — R278 Other lack of coordination: Secondary | ICD-10-CM | POA: Diagnosis not present

## 2022-10-10 DIAGNOSIS — N941 Unspecified dyspareunia: Secondary | ICD-10-CM | POA: Diagnosis not present

## 2022-10-10 DIAGNOSIS — N3946 Mixed incontinence: Secondary | ICD-10-CM | POA: Diagnosis not present

## 2022-10-10 DIAGNOSIS — M6281 Muscle weakness (generalized): Secondary | ICD-10-CM | POA: Diagnosis not present

## 2022-10-11 ENCOUNTER — Ambulatory Visit: Payer: 59 | Admitting: Family Medicine

## 2022-10-11 DIAGNOSIS — F331 Major depressive disorder, recurrent, moderate: Secondary | ICD-10-CM | POA: Diagnosis not present

## 2022-10-16 ENCOUNTER — Encounter: Payer: Self-pay | Admitting: Family Medicine

## 2022-10-16 ENCOUNTER — Ambulatory Visit: Payer: 59 | Admitting: Family Medicine

## 2022-10-16 VITALS — BP 118/68 | HR 76 | Temp 97.7°F | Ht 68.0 in | Wt 132.6 lb

## 2022-10-16 DIAGNOSIS — Z23 Encounter for immunization: Secondary | ICD-10-CM | POA: Diagnosis not present

## 2022-10-16 DIAGNOSIS — F418 Other specified anxiety disorders: Secondary | ICD-10-CM | POA: Diagnosis not present

## 2022-10-16 DIAGNOSIS — G4710 Hypersomnia, unspecified: Secondary | ICD-10-CM | POA: Insufficient documentation

## 2022-10-16 DIAGNOSIS — K589 Irritable bowel syndrome without diarrhea: Secondary | ICD-10-CM | POA: Insufficient documentation

## 2022-10-16 DIAGNOSIS — L709 Acne, unspecified: Secondary | ICD-10-CM | POA: Insufficient documentation

## 2022-10-16 DIAGNOSIS — Z975 Presence of (intrauterine) contraceptive device: Secondary | ICD-10-CM | POA: Insufficient documentation

## 2022-10-16 NOTE — Progress Notes (Signed)
[facility] [address] [place] [address] Dept: [phone] Dept Fax: [phone]  Chronic Care Office Visit  Subjective:    Patient ID: Tracy Gallegos, female    DOB: 03-23-1986, 36 y.o..   MRN: [id number]  Chief Complaint  Patient presents with   Follow-up    F/u meds.      History of Present Illness:  Patient is in today for reassessment of chronic medical issues.  [name] has a history of depression with anxiety. She is managed on escitalopram 15 mg daily, though she notes she is typically only taking 10 mg daily. She feels her mood is improved. Her family life remains quite chaotic, with caring for her children, including her son, Tracy Gallegos, who has significant health issues. It has been helpful for her to have her husband change to his current job, where he can be off work 1 day a week, which allows him to help out more with the children. This has allowed [name] more time to address her own needs. She does admit that there does not seem to be any time for her and her husband to be alone as a couple. She notes difficulties with trustworthy childcare.  Family trips have proven problematic as well. She does fell she is holding it together at present. She continues to engage in counseling with Dr. Gwenlyn Gallegos (psychology).  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   Anti-D antibodies present during pregnancy 2017-10-14   Primary female infertility 09/15/2014    Fibromyalgia 08/03/2013   Past Surgical History:  Procedure Laterality Date   OVARIAN CYST SURGERY     WISDOM TOOTH EXTRACTION     Family History  Problem Relation Age of Onset   Obesity Mother    Depression Mother    Alcohol abuse Mother    Heart disease Father    Early death Father    Obesity Brother    Alcohol abuse Maternal Uncle    Depression Maternal Uncle    Hypertension Maternal Grandmother    Depression Maternal Grandmother    Alcohol abuse Maternal Grandmother    Alzheimer's disease Maternal Grandmother    Depression Maternal Grandfather    Stroke Maternal Grandfather    Heart disease Maternal Grandfather    Suicidality Maternal Grandfather    Suicidality Paternal Grandmother    Depression Paternal Grandmother    Heart disease Paternal Grandfather    Early death Paternal Grandfather    Outpatient Medications Prior to Visit  Medication Sig Dispense Refill   amphetamine-dextroamphetamine (ADDERALL) 10 MG tablet Take 1 tablet (10 mg total) by mouth daily with breakfast. 30 tablet 0   cetirizine (ZYRTEC) 10 MG tablet Take 10 mg by mouth daily. Daily as needed     dicyclomine (BENTYL) 20 MG tablet Take 0.5 tablets (10 mg total) by mouth 3 (three) times daily for abdominal pain. (Patient taking differently: Take 10 mg by mouth 3 (three) times daily. Takes up to three times a day as needed) 45 tablet 5   escitalopram (LEXAPRO) 10 MG tablet  Take 1.5 tablets (15 mg total) by mouth daily. (Patient taking differently: Take 10 mg by mouth daily.) 135 tablet 3   levonorgestrel (MIRENA) 20 MCG/DAY IUD 1 each by Intrauterine route once.     loratadine (CLARITIN) 10 MG tablet Take 10 mg by mouth daily as needed for allergies.     metoprolol succinate (TOPROL-XL) 25 MG 24 hr tablet Take 1 tablet (25 mg total) by mouth daily. (Patient taking differently: Take 12.5 mg by mouth every evening.) 90 tablet 3   mirabegron ER (MYRBETRIQ) 25 MG TB24 tablet Take 1 tablet (25 mg total) by mouth  daily. 30 tablet 3   naproxen (NAPROSYN) 500 MG tablet Take 500 mg by mouth as needed. Takes as needed only     prednisoLONE acetate (PRED FORTE) 1 % ophthalmic suspension Place 4 drops into the right ear as needed for itching. 5 mL 5   spironolactone (ALDACTONE) 100 MG tablet Take 0.5 tablet (50 mg) by mouth daily for 7 days, then take 1 tablet (100 mg) by mouth daily for 23 days. (Patient taking differently: Take 100 mg by mouth. Taking 50 mg daily) 30 tablet 2   traZODone (DESYREL) 50 MG tablet Take 1/2 tablet (25 mg total) by mouth at bedtime as needed for sleep. 30 tablet 3   dexamethasone (MAXIDEX) 0.1 % ophthalmic suspension Place 2 drops into both ears 2 (two) times daily. (Patient not taking: Reported on 10/16/2022) 15 mL 1   drospirenone-ethinyl estradiol (YASMIN 28) 3-0.03 MG tablet Take 1 tablet by mouth daily **continuous dosing. 28 tablet 11   mometasone (ELOCON) 0.1 % cream Apply topically daily as needed for itching. (Patient not taking: Reported on 10/16/2022) 15 g 5   tretinoin (RETIN-A) 0.05 % cream Apply topically at bedtime. (Patient not taking: Reported on 10/16/2022) 45 g 0   No facility-administered medications prior to visit.   No Known Allergies    Objective:   Today's Vitals   10/16/22 1517  BP: 118/68  Pulse: 76  Temp: 97.7 F (36.5 C)  TempSrc: Temporal  SpO2: 98%  Weight: 132 lb 9.6 oz (60.1 kg)  Height: '5\' 8"'$  (1.727 m)   Body mass index is 20.16 kg/m.   General: Well developed, well nourished. No acute distress. Psych: Alert and oriented. Mildly anxious mood with mild tearfulness.  Health Maintenance Due  Topic Date Due   Hepatitis C Screening  Never done   HPV VACCINES (2 - Risk 3-dose SCDM series) 08/02/2020   PAP SMEAR-Modifier  09/12/2022      10/16/2022    3:57 PM 02/19/2022    4:19 PM 09/17/2021    3:53 PM  Depression screen PHQ 2/9  Decreased Interest 1 1   Down, Depressed, Hopeless 1 1   PHQ - 2 Score 2 2   Altered sleeping 1 0    Tired, decreased energy 3 3   Change in appetite 0 0   Feeling bad or failure about yourself  1 1   Trouble concentrating 1 0   Moving slowly or fidgety/restless 0 0   Suicidal thoughts 0 0   PHQ-9 Score 8 6   Difficult doing work/chores Somewhat difficult Somewhat difficult      Information is confidential and restricted. Go to Review Flowsheets to unlock data.      10/16/2022    3:57 PM 08/02/2021   11:34 AM 04/13/2021    3:30 PM 09/29/2020    3:36 PM  GAD 7 : Generalized Anxiety Score  Nervous, Anxious,  on Edge '1 3 3 3  '$ Control/stop worrying '2 3 3 3  '$ Worry too much - different things '2 3 3 3  '$ Trouble relaxing '3 1 3 3  '$ Restless 0 0 0 0  Easily annoyed or irritable '3 3 3 3  '$ Afraid - awful might happen '1 2 2 3  '$ Total GAD 7 Score '12 15 17 18  '$ Anxiety Difficulty Somewhat difficult Very difficult Very difficult Extremely difficult     Assessment & Plan:   1. Depression with anxiety Symptom scores are improving relating to anxiety. I encouraged her to look for opportunities for her and her husband to connect  as a couple, realizing this may be difficult at this stage in their life. I will continue Lexapro 10 mg daily   2. Need for influenza vaccination  - Flu Vaccine QUAD 6+ mos PF IM (Fluarix Quad PF)  Return in about 6 months (around 04/17/2023) for Reassessment.   Haydee Salter, MD

## 2022-10-23 ENCOUNTER — Encounter: Payer: Self-pay | Admitting: Family Medicine

## 2022-10-30 ENCOUNTER — Other Ambulatory Visit (HOSPITAL_COMMUNITY): Payer: Self-pay

## 2022-10-30 ENCOUNTER — Other Ambulatory Visit: Payer: Self-pay | Admitting: Family Medicine

## 2022-10-30 MED ORDER — SPIRONOLACTONE 100 MG PO TABS
ORAL_TABLET | ORAL | 2 refills | Status: DC
  Filled 2022-10-30 – 2022-11-11 (×2): qty 30, 30d supply, fill #0
  Filled 2023-01-19: qty 30, 7d supply, fill #1
  Filled 2023-03-20: qty 30, 30d supply, fill #2

## 2022-11-11 ENCOUNTER — Other Ambulatory Visit: Payer: Self-pay

## 2022-11-12 ENCOUNTER — Other Ambulatory Visit: Payer: Self-pay

## 2022-11-13 DIAGNOSIS — N941 Unspecified dyspareunia: Secondary | ICD-10-CM | POA: Diagnosis not present

## 2022-11-13 DIAGNOSIS — M6281 Muscle weakness (generalized): Secondary | ICD-10-CM | POA: Diagnosis not present

## 2022-11-13 DIAGNOSIS — N3946 Mixed incontinence: Secondary | ICD-10-CM | POA: Diagnosis not present

## 2022-11-13 DIAGNOSIS — R278 Other lack of coordination: Secondary | ICD-10-CM | POA: Diagnosis not present

## 2022-12-13 ENCOUNTER — Encounter: Payer: Self-pay | Admitting: Family Medicine

## 2022-12-13 DIAGNOSIS — F418 Other specified anxiety disorders: Secondary | ICD-10-CM

## 2022-12-30 ENCOUNTER — Other Ambulatory Visit (INDEPENDENT_AMBULATORY_CARE_PROVIDER_SITE_OTHER): Payer: Self-pay

## 2022-12-30 ENCOUNTER — Other Ambulatory Visit (HOSPITAL_COMMUNITY): Payer: Self-pay

## 2022-12-31 ENCOUNTER — Other Ambulatory Visit (HOSPITAL_COMMUNITY): Payer: Self-pay

## 2022-12-31 MED ORDER — MOMETASONE FUROATE 0.1 % EX CREA
TOPICAL_CREAM | Freq: Every day | CUTANEOUS | 5 refills | Status: AC | PRN
  Filled 2022-12-31: qty 15, 10d supply, fill #0
  Filled 2023-01-06: qty 15, 30d supply, fill #0
  Filled 2023-05-03: qty 15, 30d supply, fill #1

## 2023-01-06 ENCOUNTER — Other Ambulatory Visit (HOSPITAL_BASED_OUTPATIENT_CLINIC_OR_DEPARTMENT_OTHER): Payer: Self-pay

## 2023-01-06 ENCOUNTER — Other Ambulatory Visit (HOSPITAL_COMMUNITY): Payer: Self-pay

## 2023-01-18 ENCOUNTER — Other Ambulatory Visit (HOSPITAL_BASED_OUTPATIENT_CLINIC_OR_DEPARTMENT_OTHER): Payer: Self-pay

## 2023-01-18 DIAGNOSIS — J029 Acute pharyngitis, unspecified: Secondary | ICD-10-CM | POA: Diagnosis not present

## 2023-01-18 DIAGNOSIS — Z6821 Body mass index (BMI) 21.0-21.9, adult: Secondary | ICD-10-CM | POA: Diagnosis not present

## 2023-01-18 DIAGNOSIS — H66001 Acute suppurative otitis media without spontaneous rupture of ear drum, right ear: Secondary | ICD-10-CM | POA: Diagnosis not present

## 2023-01-18 MED ORDER — AMOXICILLIN-POT CLAVULANATE 875-125 MG PO TABS
1.0000 | ORAL_TABLET | Freq: Two times a day (BID) | ORAL | 0 refills | Status: DC
  Filled 2023-01-18: qty 20, 10d supply, fill #0

## 2023-01-19 ENCOUNTER — Other Ambulatory Visit: Payer: Self-pay | Admitting: Family Medicine

## 2023-01-19 ENCOUNTER — Other Ambulatory Visit (HOSPITAL_COMMUNITY): Payer: Self-pay

## 2023-01-20 ENCOUNTER — Other Ambulatory Visit: Payer: Self-pay

## 2023-01-20 ENCOUNTER — Other Ambulatory Visit (HOSPITAL_COMMUNITY): Payer: Self-pay

## 2023-01-20 MED ORDER — DICYCLOMINE HCL 20 MG PO TABS
10.0000 mg | ORAL_TABLET | Freq: Three times a day (TID) | ORAL | 5 refills | Status: DC
  Filled 2023-01-20 – 2024-01-16 (×2): qty 45, 30d supply, fill #0

## 2023-01-21 ENCOUNTER — Encounter: Payer: Self-pay | Admitting: Family Medicine

## 2023-01-21 ENCOUNTER — Other Ambulatory Visit (HOSPITAL_BASED_OUTPATIENT_CLINIC_OR_DEPARTMENT_OTHER): Payer: Self-pay

## 2023-01-21 ENCOUNTER — Other Ambulatory Visit: Payer: Self-pay

## 2023-01-21 ENCOUNTER — Ambulatory Visit: Payer: Commercial Managed Care - PPO | Admitting: Family Medicine

## 2023-01-21 VITALS — BP 116/68 | HR 82 | Temp 98.4°F | Ht 68.0 in | Wt 134.0 lb

## 2023-01-21 DIAGNOSIS — Z860100 Personal history of colon polyps, unspecified: Secondary | ICD-10-CM | POA: Insufficient documentation

## 2023-01-21 DIAGNOSIS — A389 Scarlet fever, uncomplicated: Secondary | ICD-10-CM | POA: Diagnosis not present

## 2023-01-21 LAB — POCT RAPID STREP A (OFFICE): Rapid Strep A Screen: NEGATIVE

## 2023-01-21 MED ORDER — NAPROXEN 500 MG PO TABS
500.0000 mg | ORAL_TABLET | ORAL | 2 refills | Status: AC | PRN
  Filled 2023-01-21: qty 30, 30d supply, fill #0

## 2023-01-21 MED ORDER — PENICILLIN V POTASSIUM 500 MG PO TABS
500.0000 mg | ORAL_TABLET | Freq: Three times a day (TID) | ORAL | 0 refills | Status: AC
  Filled 2023-01-21: qty 30, 10d supply, fill #0

## 2023-01-21 NOTE — Progress Notes (Signed)
[facility] [address] [place] [address] Dept: [phone] Dept Fax: [phone]  Office Visit  Subjective:    Patient ID: Tracy Gallegos, female    DOB: Nov 17, 1985, 37 y.o..   MRN: [id number]  Chief Complaint  Patient presents with   Rash    C/o having a rash    History of Present Illness:  Patient is in today having developed an illness last Thursday. She notes this started with some sore throat. Within 24 hours, she had fever, headaches, and was feeling ill. She was seen [facility] the [facility]. Her strep test was negative. Since then, she has now developed a rash on there trunk and extremities. This pruritic. she was prescribed Augmentin at the UC, but did not fill this.   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   Anti-D antibodies present during pregnancy 25-Sep-2017   Primary female infertility 09/15/2014   Fibromyalgia 08/03/2013   Past Surgical History:  Procedure Laterality Date   OVARIAN CYST SURGERY     WISDOM TOOTH EXTRACTION     Family History  Problem Relation Age of Onset   Obesity Mother    Depression Mother    Alcohol abuse Mother    Heart disease Father    Early death Father    Obesity Brother    Alcohol abuse Maternal Uncle    Depression Maternal Uncle    Hypertension Maternal Grandmother    Depression Maternal Grandmother    Alcohol abuse Maternal Grandmother    Alzheimer's disease Maternal  Grandmother    Depression Maternal Grandfather    Stroke Maternal Grandfather    Heart disease Maternal Grandfather    Suicidality Maternal Grandfather    Suicidality Paternal Grandmother    Depression Paternal Grandmother    Heart disease Paternal Grandfather    Early death Paternal Grandfather    Outpatient Medications Prior to Visit  Medication Sig Dispense Refill   amphetamine-dextroamphetamine (ADDERALL) 10 MG tablet Take 1 tablet (10 mg total) by mouth daily with breakfast. 30 tablet 0   cetirizine (ZYRTEC) 10 MG tablet Take 10 mg by mouth daily. Daily as needed     dicyclomine (BENTYL) 20 MG tablet Take 0.5 tablets (10 mg total) by mouth 3 (three) times daily for abdominal pain. 45 tablet 5   escitalopram (LEXAPRO) 10 MG tablet Take 1.5 tablets (15 mg total) by mouth daily. (Patient taking differently: Take 10 mg by mouth daily.) 135 tablet 3   levonorgestrel (MIRENA) 20 MCG/DAY IUD 1 each by Intrauterine route once.     loratadine (CLARITIN) 10 MG tablet Take 10 mg by mouth daily as needed for allergies.     metoprolol succinate (TOPROL-XL) 25 MG 24 hr tablet Take 1 tablet (25 mg total) by mouth daily. (Patient taking differently: Take 12.5 mg by mouth every evening.) 90 tablet 3   mirabegron ER (MYRBETRIQ) 25 MG TB24 tablet Take 1 tablet (25 mg total) by mouth daily. 30 tablet 3   mometasone (ELOCON) 0.1 %  cream Apply topically daily as needed for itching. 15 g 5   naproxen (NAPROSYN) 500 MG tablet Take 500 mg by mouth as needed. Takes as needed only     prednisoLONE acetate (PRED FORTE) 1 % ophthalmic suspension Place 4 drops into the right ear as needed for itching. 5 mL 5   spironolactone (ALDACTONE) 100 MG tablet Take 0.5 tablets (50 mg total) by mouth daily for 7 days, THEN 1 tablet (100 mg total) daily. 30 tablet 2   traZODone (DESYREL) 50 MG tablet Take 1/2 tablet (25 mg total) by mouth at bedtime as needed for sleep. 30 tablet 3   amoxicillin-clavulanate (AUGMENTIN) 875-125  MG tablet Take 1 tablet by mouth 2 (two) times daily for 10 days. (Patient not taking: Reported on 01/21/2023) 20 tablet 0   No facility-administered medications prior to visit.   No Known Allergies   Objective:   Today's Vitals   01/21/23 1423  BP: 116/68  Pulse: 82  Temp: 98.4 F (36.9 C)  TempSrc: Temporal  SpO2: 97%  Weight: 134 lb (60.8 kg)  Height: '5\' 8"'$  (1.727 m)   Body mass index is 20.37 kg/m.   General: Well developed, well nourished. No acute distress. HEENT: Normocephalic, non-traumatic. Conjunctiva clear. External ears normal. EAC and TMs normal   bilaterally. Nose clear without congestion or rhinorrhea. Mucous membranes moist. Oropharynx mildly red   without exudates. Good dentition. Skin: There is a diffuse fine papular rash on the trunk and extremities. No sign of rash on the palms. There   are some areas where the rash is more consolidated. There is some excoriation. Psych: Alert and oriented. Normal mood and affect.  Health Maintenance Due  Topic Date Due   Hepatitis C Screening  Never done   HPV VACCINES (2 - Risk 3-dose SCDM series) 08/02/2020   PAP SMEAR-Modifier  09/12/2022   Lab Results Rapid Strep: Neg.    Assessment & Plan:   Problem List Items Addressed This Visit       Musculoskeletal and Integument   Scarlet fever - Primary    History of illness and rash are consistent with scarlet fever. I will send a strep culture. I do want to go ahead and treat with a 10-day course of Pen VK. I recommend use of Benadryl for itching.      Relevant Medications   penicillin v potassium (VEETID) 500 MG tablet   Other Relevant Orders   POCT rapid strep A (Completed)   Culture, Group A Strep    Return if symptoms worsen or fail to improve.   Haydee Salter, MD

## 2023-01-21 NOTE — Assessment & Plan Note (Signed)
History of illness and rash are consistent with scarlet fever. I will send a strep culture. I do want to go ahead and treat with a 10-day course of Pen VK. I recommend use of Benadryl for itching.

## 2023-01-22 ENCOUNTER — Other Ambulatory Visit (HOSPITAL_COMMUNITY): Payer: Self-pay

## 2023-01-23 ENCOUNTER — Other Ambulatory Visit (HOSPITAL_COMMUNITY): Payer: Self-pay

## 2023-01-23 ENCOUNTER — Other Ambulatory Visit (HOSPITAL_BASED_OUTPATIENT_CLINIC_OR_DEPARTMENT_OTHER): Payer: Self-pay

## 2023-01-23 ENCOUNTER — Other Ambulatory Visit: Payer: Self-pay

## 2023-01-23 LAB — CULTURE, GROUP A STREP
MICRO NUMBER:: 14681354
SPECIMEN QUALITY:: ADEQUATE

## 2023-01-24 ENCOUNTER — Other Ambulatory Visit: Payer: Self-pay

## 2023-01-28 ENCOUNTER — Other Ambulatory Visit (HOSPITAL_COMMUNITY): Payer: Self-pay

## 2023-02-02 ENCOUNTER — Encounter: Payer: Self-pay | Admitting: Family Medicine

## 2023-02-02 DIAGNOSIS — G479 Sleep disorder, unspecified: Secondary | ICD-10-CM

## 2023-02-04 NOTE — Addendum Note (Signed)
Addended by: Haydee Salter on: 02/04/2023 05:21 PM   Modules accepted: Orders

## 2023-03-03 ENCOUNTER — Other Ambulatory Visit: Payer: Self-pay | Admitting: Family Medicine

## 2023-03-03 ENCOUNTER — Other Ambulatory Visit (HOSPITAL_BASED_OUTPATIENT_CLINIC_OR_DEPARTMENT_OTHER): Payer: Self-pay

## 2023-03-03 ENCOUNTER — Other Ambulatory Visit: Payer: Self-pay

## 2023-03-03 ENCOUNTER — Other Ambulatory Visit (HOSPITAL_COMMUNITY): Payer: Self-pay

## 2023-03-03 DIAGNOSIS — F418 Other specified anxiety disorders: Secondary | ICD-10-CM

## 2023-03-03 DIAGNOSIS — R5383 Other fatigue: Secondary | ICD-10-CM

## 2023-03-03 DIAGNOSIS — G479 Sleep disorder, unspecified: Secondary | ICD-10-CM

## 2023-03-03 MED ORDER — AMPHETAMINE-DEXTROAMPHETAMINE 10 MG PO TABS
10.0000 mg | ORAL_TABLET | Freq: Every day | ORAL | 0 refills | Status: DC
  Filled 2023-03-03: qty 30, 30d supply, fill #0

## 2023-03-03 MED ORDER — ESCITALOPRAM OXALATE 10 MG PO TABS
15.0000 mg | ORAL_TABLET | Freq: Every day | ORAL | 0 refills | Status: DC
  Filled 2023-03-03: qty 135, 90d supply, fill #0

## 2023-03-03 NOTE — Telephone Encounter (Signed)
Refill request for  Adderall 10 mg LR 09/23/22, #30, 0 rf LOV 01/21/23 FOV 04/16/23  Please review and advise.  Thanks. Dm/cma

## 2023-03-19 ENCOUNTER — Encounter (HOSPITAL_BASED_OUTPATIENT_CLINIC_OR_DEPARTMENT_OTHER): Payer: Self-pay | Admitting: Pharmacist

## 2023-03-19 ENCOUNTER — Telehealth: Payer: Commercial Managed Care - PPO | Admitting: Family Medicine

## 2023-03-19 ENCOUNTER — Other Ambulatory Visit (HOSPITAL_BASED_OUTPATIENT_CLINIC_OR_DEPARTMENT_OTHER): Payer: Self-pay

## 2023-03-19 DIAGNOSIS — L709 Acne, unspecified: Secondary | ICD-10-CM | POA: Diagnosis not present

## 2023-03-19 MED ORDER — TRETINOIN 0.01 % EX GEL
Freq: Every day | CUTANEOUS | 0 refills | Status: AC
Start: 2023-03-19 — End: ?
  Filled 2023-03-19: qty 15, 30d supply, fill #0

## 2023-03-19 MED ORDER — CLINDAMYCIN PHOSPHATE 1 % EX GEL
Freq: Two times a day (BID) | CUTANEOUS | 0 refills | Status: DC
  Filled 2023-03-19: qty 30, 15d supply, fill #0

## 2023-03-19 NOTE — Addendum Note (Signed)
Addended by: Freddy Finner on: 03/19/2023 12:54 PM   Modules accepted: Orders

## 2023-03-19 NOTE — Progress Notes (Signed)
E-Visit for Acne  We are sorry that you are experiencing this issue.  Here is how we plan to help!  Based on what you shared with me it looks like you have uncomplicated acne.  Acne is a disorder of the hair follicles and oil glands (sebaceous glands). The sebaceous glands secrete oils to keep the skin moist.  When the glands get clogged, it can lead to pimples or cysts.  These cysts may become infected and leave scars. Acne is very common and normally occurs at puberty.  Acne is also inherited.  Your personal care plan consists of the following recommendations:  I recommend that you use a daily cleanser  You may try a topical exfoliator and salicylic acid scrub.  These scrubs have coarse particles that clear your pores but may also irritate your skin. So use every other day if you develop irritation.  I have prescribed a topical gel with an antibiotic:  Cleocin Gel- apply twice daily.  This helps with the type of acne you are having. It will help clear and keep it from occurring.    If excessive dryness or peeling occurs, reduce dose frequency or concentration of the topical scrubs.  If excessive stinging or burning occurs, remove the topical gel with mild soap and water and resume at a lower dose the next day.  Remember oral antibiotics and topical acne treatments may increase your sensitivity to the sun!  HOME CARE: Do not squeeze pimples because that can often lead to infections, worse acne, and scars. Use a moisturizer that contains retinoid or fruit acids that may inhibit the development of new acne lesions. Although there is not a clear link that foods can cause acne, doctors do believe that too many sweets predispose you to skin problems.  GET HELP RIGHT AWAY IF: If your acne gets worse or is not better within 10 days. If you become depressed. If you become pregnant, discontinue medications and call your OB/GYN.  MAKE SURE YOU: Understand these instructions. Will watch your  condition. Will get help right away if you are not doing well or get worse.  Thank you for choosing an e-visit.  Your e-visit answers were reviewed by a board certified advanced clinical practitioner to complete your personal care plan. Depending upon the condition, your plan could have included both over the counter or prescription medications.  Please review your pharmacy choice. Make sure the pharmacy is open so you can pick up prescription now. If there is a problem, you may contact your provider through [employer] and have the prescription routed to another pharmacy.  Your safety is important to [place]. If you have drug allergies check your prescription carefully.   For the next 24 hours you can use MyChart to ask questions about today's visit, request a non-urgent call back, or ask for a work or school excuse. You will get an email in the next two days asking about your experience. I hope that your e-visit has been valuable and will speed your recovery.  I provided 5 minutes of non face-to-face time during this encounter for chart review, medication and order placement, as well as and documentation.

## 2023-03-20 ENCOUNTER — Other Ambulatory Visit (HOSPITAL_BASED_OUTPATIENT_CLINIC_OR_DEPARTMENT_OTHER): Payer: Self-pay

## 2023-03-23 ENCOUNTER — Other Ambulatory Visit (HOSPITAL_BASED_OUTPATIENT_CLINIC_OR_DEPARTMENT_OTHER): Payer: Self-pay

## 2023-03-23 ENCOUNTER — Telehealth: Payer: Commercial Managed Care - PPO | Admitting: Family

## 2023-03-23 DIAGNOSIS — J301 Allergic rhinitis due to pollen: Secondary | ICD-10-CM

## 2023-03-23 MED ORDER — FEXOFENADINE HCL 180 MG PO TABS
180.0000 mg | ORAL_TABLET | Freq: Every day | ORAL | 1 refills | Status: AC
  Filled 2023-03-23 – 2023-04-01 (×2): qty 100, 100d supply, fill #0
  Filled 2023-04-11: qty 120, 120d supply, fill #0

## 2023-03-23 NOTE — Progress Notes (Signed)
E visit for Allergic Rhinitis We are sorry that you are not feeling well.  Here is how we plan to help!  Based on what you have shared with me it looks like you have Allergic Rhinitis.  Rhinitis is when a reaction occurs that causes nasal congestion, runny nose, sneezing, and itching.  Most types of rhinitis are caused by an inflammation and are associated with symptoms in the eyes ears or throat. There are several types of rhinitis.  The most common are acute rhinitis, which is usually caused by a viral illness, allergic or seasonal rhinitis, and nonallergic or year-round rhinitis.  Nasal allergies occur certain times of the year.  Allergic rhinitis is caused when allergens in the air trigger the release of histamine in the body.  Histamine causes itching, swelling, and fluid to build up in the fragile linings of the nasal passages, sinuses and eyelids.  An itchy nose and clear discharge are common.  I recommend the following over the counter treatments: Allegra 180 mg daily   HOME CARE:  You can use an over-the-counter saline nasal spray as needed Avoid areas where there is heavy dust, mites, or molds Stay indoors on windy days during the pollen season Keep windows closed in home, at least in bedroom; use air conditioner. Use high-efficiency house air filter Keep windows closed in car, turn AC on re-circulate Avoid playing out with dog during pollen season  GET HELP RIGHT AWAY IF:  If your symptoms do not improve within 10 days You become short of breath You develop yellow or green discharge from your nose for over 3 days You have coughing fits  MAKE SURE YOU:  Understand these instructions Will watch your condition Will get help right away if you are not doing well or get worse  Thank you for choosing an e-visit. Your e-visit answers were reviewed by a board certified advanced clinical practitioner to complete your personal care plan. Depending upon the condition, your plan could  have included both over the counter or prescription medications. Please review your pharmacy choice. Be sure that the pharmacy you have chosen is open so that you can pick up your prescription now.  If there is a problem you may message your provider in MyChart to have the prescription routed to another pharmacy. Your safety is important to [place]. If you have drug allergies check your prescription carefully.  For the next 24 hours, you can use MyChart to ask questions about today's visit, request a non-urgent call back, or ask for a work or school excuse from your e-visit provider. You will get an email in the next two days asking about your experience. I hope that your e-visit has been valuable and will speed your recovery.    Approximately 5 minutes was spent documenting and reviewing patient's chart.

## 2023-03-24 ENCOUNTER — Other Ambulatory Visit (HOSPITAL_BASED_OUTPATIENT_CLINIC_OR_DEPARTMENT_OTHER): Payer: Self-pay

## 2023-03-31 ENCOUNTER — Other Ambulatory Visit (HOSPITAL_BASED_OUTPATIENT_CLINIC_OR_DEPARTMENT_OTHER): Payer: Self-pay

## 2023-04-01 ENCOUNTER — Other Ambulatory Visit (HOSPITAL_BASED_OUTPATIENT_CLINIC_OR_DEPARTMENT_OTHER): Payer: Self-pay

## 2023-04-02 ENCOUNTER — Encounter: Payer: Self-pay | Admitting: Neurology

## 2023-04-02 ENCOUNTER — Ambulatory Visit: Payer: Commercial Managed Care - PPO | Admitting: Neurology

## 2023-04-02 VITALS — BP 101/69 | HR 89 | Ht 68.0 in | Wt 134.2 lb

## 2023-04-02 DIAGNOSIS — R0683 Snoring: Secondary | ICD-10-CM

## 2023-04-02 DIAGNOSIS — F519 Sleep disorder not due to a substance or known physiological condition, unspecified: Secondary | ICD-10-CM | POA: Diagnosis not present

## 2023-04-02 DIAGNOSIS — R351 Nocturia: Secondary | ICD-10-CM

## 2023-04-02 DIAGNOSIS — F518 Other sleep disorders not due to a substance or known physiological condition: Secondary | ICD-10-CM

## 2023-04-02 DIAGNOSIS — F514 Sleep terrors [night terrors]: Secondary | ICD-10-CM

## 2023-04-02 NOTE — Progress Notes (Signed)
SLEEP MEDICINE CLINIC    Provider:  Melvyn Novas, MD  Primary Care Physician:  Loyola Mast, MD [address] [place] [address]     Referring Provider: Loyola Mast, Md [address] [place],  [place] [address]          Chief Complaint according to patient   Patient presents with:     New Patient (Initial Visit)           HISTORY OF PRESENT ILLNESS:  Tracy Gallegos is a 37 y.o. female patient who is seen upon referral on 04/02/2023 from PCP Dr Veto Kemps for a Sleep Consult. She is the wife of Dr Evaristo Bury.   Chief concern according to patient : NEW Sleep Patient in room #1 with her young daughter.  Patient snores and wakes up in the middle of the night gasping for air. Patient states her mother had sleep apnea but was heavy. She is married, with three children,  2 healthy daughters and her 13 year-old son [name] has special needs, non ambulatory, feeding tube, immune deficiency.   I have the pleasure of seeing Tracy Gallegos in a visit on 04/02/23 : "I have started to snore and wake up gasping for air, feeling as if I can't catch air , waking me up from sleep. Trazodone has helped to sleep, adderall had helped with fatigue".   Her previous sleep medicine physician Dr Waynard Edwards, MD, dx sleep deprivation and depression- and had placed her on Seroquel in 2004, and on modafinil- repeat PSG in 2009 - both show a lot of REM pressure and  not enough slow wave sleep. Was on Prozac at the time, too. Now on trazodone and adderall only 5 mg. Both are prn meds for her.    Nocturia 2-5 times (!) while on fluid restrictions after 6 Pm-, Sleep walking in child hood, Night terrors, violent dreams- PTSD from childhood.   Family medical /sleep history: No other family member on CPAP with OSA, insomnia, sleep walkers.    Social history:  Patient is working as full time caregiver and lives in a household with 3 children and physician husband-   Tobacco use/.  ETOH use /, Caffeine intake in form of Coffee( 1 in am) Soda( /) Tea ( /) or energy drinks Exercise in the gym bu also physically carrying her son , who is ow walking some, speaking and continent.    Sleep habits are as follows: The patient's dinner time is between 5-6.30 PM.  The patient goes to bed at 9-10 PM and continues to get up for bathroom breaks and gets up for her son ( feeding tube ) , sleeps a total of  8 hours. The preferred sleep position is laterally, wakes up on her back-with the support of 2 pillows.  Dreams are  very frequent and very vivid.  Less nightmarish on lexapro,  The patient wakes up bu y alarm 6.20 , 6.45  AM is the usual rise time. She reports not feeling refreshed or restored in AM, with symptoms such as dry mouth, no morning headaches, and residual fatigue.  Naps are taken when the opportunity arises.    Review of Systems: Out of a complete 14 system review, the patient complains of only the following symptoms, and all other reviewed systems are negative.:  Fatigue, sleepiness , snoring, fragmented sleep,  Insomnia, parasomnia-  Nocturia yes , frequent   How likely are you to doze in the  following situations: 0 = not likely, 1 = slight chance, 2 = moderate chance, 3 = high chance   Sitting and Reading? Watching Television? Sitting inactive in a public place (theater or meeting)? As a passenger in a car for an hour without a break? Lying down in the afternoon when circumstances permit? Sitting and talking to someone? Sitting quietly after lunch without alcohol? In a car, while stopped for a few minutes in traffic?   Total = 8/ 24 points   FSS endorsed at 55/ 63 points.   Social History   Socioeconomic History   Marital status: Married    Spouse name: Brad   Number of children: 3   Years of education: Not on file   Highest education level: Not on file  Occupational History   Not on file  Tobacco Use   Smoking status: Never    Smokeless tobacco: Never  Vaping Use   Vaping Use: Never used  Substance and Sexual Activity   Alcohol use: No   Drug use: No   Sexual activity: Yes    Birth control/protection: None  Other Topics Concern   Not on file  Social History Narrative   Special needs child   Social Determinants of Health   Financial Resource Strain: Not on file  Food Insecurity: Not on file  Transportation Needs: Not on file  Physical Activity: Not on file  Stress: Not on file  Social Connections: Not on file    Family History  Problem Relation Age of Onset   Obesity Mother    Depression Mother    Alcohol abuse Mother    Heart disease Father    Early death Father    Obesity Brother    Alcohol abuse Maternal Uncle    Depression Maternal Uncle    Hypertension Maternal Grandmother    Depression Maternal Grandmother    Alcohol abuse Maternal Grandmother    Alzheimer's disease Maternal Grandmother    Depression Maternal Grandfather    Stroke Maternal Grandfather    Heart disease Maternal Grandfather    Suicidality Maternal Grandfather    Suicidality Paternal Grandmother    Depression Paternal Grandmother    Heart disease Paternal Grandfather    Early death Paternal Grandfather     Past Medical History:  Diagnosis Date   Depression    Endometriosis    History of gestational diabetes 01/23/2018   Ovarian cyst    Bilateral    Past Surgical History:  Procedure Laterality Date   OVARIAN CYST SURGERY     WISDOM TOOTH EXTRACTION       Current Outpatient Medications on File Prior to Visit  Medication Sig Dispense Refill   amphetamine-dextroamphetamine (ADDERALL) 10 MG tablet Take 1 tablet (10 mg total) by mouth daily with breakfast. 30 tablet 0   cetirizine (ZYRTEC) 10 MG tablet Take 10 mg by mouth daily. Daily as needed     clindamycin (CLINDAGEL) 1 % gel Apply topically 2 (two) times daily. 30 g 0   dicyclomine (BENTYL) 20 MG tablet Take 0.5 tablets (10 mg total) by mouth 3 (three)  times daily for abdominal pain. 45 tablet 5   escitalopram (LEXAPRO) 10 MG tablet Take 1.5 tablets (15 mg total) by mouth daily. 135 tablet 0   fexofenadine (ALLEGRA ALLERGY) 180 MG tablet Take 1 tablet (180 mg total) by mouth daily. 100 tablet 1   levonorgestrel (MIRENA) 20 MCG/DAY IUD 1 each by Intrauterine route once.     loratadine (CLARITIN) 10 MG tablet Take  10 mg by mouth daily as needed for allergies.     metoprolol succinate (TOPROL-XL) 25 MG 24 hr tablet Take 1 tablet (25 mg total) by mouth daily. (Patient taking differently: Take 12.5 mg by mouth every evening.) 90 tablet 3   mirabegron ER (MYRBETRIQ) 25 MG TB24 tablet Take 1 tablet (25 mg total) by mouth daily. 30 tablet 3   mometasone (ELOCON) 0.1 % cream Apply topically daily as needed for itching. 15 g 5   naproxen (NAPROSYN) 500 MG tablet Take 1 tablet (500 mg total) by mouth as needed. Takes as needed only 30 tablet 2   prednisoLONE acetate (PRED FORTE) 1 % ophthalmic suspension Place 4 drops into the right ear as needed for itching. 5 mL 5   spironolactone (ALDACTONE) 100 MG tablet Take 0.5 tablets (50 mg total) by mouth daily for 7 days, THEN 1 tablet (100 mg total) daily. 30 tablet 2   traZODone (DESYREL) 50 MG tablet Take 1/2 tablet (25 mg total) by mouth at bedtime as needed for sleep. 30 tablet 3   tretinoin (RETIN-A) 0.01 % gel Apply topically at bedtime. 15 g 0   No current facility-administered medications on file prior to visit.    No Known Allergies   DIAGNOSTIC DATA (LABS, IMAGING, TESTING) - I reviewed patient records, labs, notes, testing and imaging myself where available.  Lab Results  Component Value Date   WBC 10.8 (H) 02/04/2020   HGB 13.2 02/04/2020   HCT 38.2 02/04/2020   MCV 93.6 02/04/2020   PLT 250 02/04/2020   No results found for: "NA", "K", "CL", "CO2", "GLUCOSE", "BUN", "CREATININE", "CALCIUM", "PROT", "ALBUMIN", "AST", "ALT", "ALKPHOS", "BILITOT", "GFRNONAA", "GFRAA" No results found for:  "CHOL", "HDL", "LDLCALC", "LDLDIRECT", "TRIG", "CHOLHDL" Lab Results  Component Value Date   HGBA1C 5.4 08/02/2021   No results found for: "VITAMINB12" Lab Results  Component Value Date   TSH 1.99 06/09/2019    PHYSICAL EXAM:  Today's Vitals   04/02/23 1011  BP: 101/69  Pulse: 89  Weight: 134 lb 3.2 oz (60.9 kg)  Height: 5\' 8"  (1.727 m)   Body mass index is 20.41 kg/m.   Wt Readings from Last 3 Encounters:  04/02/23 134 lb 3.2 oz (60.9 kg)  01/21/23 134 lb (60.8 kg)  10/16/22 132 lb 9.6 oz (60.1 kg)     Ht Readings from Last 3 Encounters:  04/02/23 5\' 8"  (1.727 m)  01/21/23 5\' 8"  (1.727 m)  10/16/22 5\' 8"  (1.727 m)      General: The patient is awake, alert and appears not in acute distress. The patient is well groomed. Head: Normocephalic, atraumatic. Neck is supple. Mallampati 2,  neck circumference:13.5  inches .  Nasal airflow  patent.  Retrognathia is not seen.  Dental status: biological   Cardiovascular:  Regular rate and cardiac rhythm by pulse,  without distended neck veins. Respiratory: Lungs are clear to auscultation.  Skin:  Without evidence of ankle edema, or rash. Trunk: The patient's posture is erect.   NEUROLOGIC EXAM: The patient is awake and alert, oriented to place and time.   Memory subjective described as intact.  Attention span & concentration ability appears normal.  Speech is fluent,  without  dysarthria, dysphonia or aphasia.  Mood and affect are appropriate.   Cranial nerves: no loss of smell or taste reported  Pupils are equal and briskly reactive to light. Funduscopic exam deferred.  Extraocular movements in vertical and horizontal planes were intact and without nystagmus. No Diplopia. Visual  fields by finger perimetry are intact. Hearing was intact to soft voice and finger rubbing.    Facial sensation intact to fine touch.  Facial motor strength is symmetric and tongue and uvula move midline.  Neck ROM : rotation, tilt and flexion  extension were normal for age and shoulder shrug was symmetrical.    Motor exam:  Symmetric bulk, tone and ROM.   Normal tone without cog wheeling, symmetric grip strength .   Sensory:  Fine touch, pinprick and vibration were tested  and  normal.  Proprioception tested in the upper extremities was normal.   Coordination: Rapid alternating movements in the fingers/hands were of normal speed.  The Finger-to-nose maneuver was intact without evidence of ataxia, dysmetria or tremor.   Gait and station: Patient could rise unassisted from a seated position, walked without assistive device.  Stance is of normal width/ base .  Toe and heel walk were deferred.  Deep tendon reflexes: in the  upper and lower extremities are symmetric and intact.  Babinski response was deferred.    ASSESSMENT AND PLAN 38 y.o. year old female  here with:    1)  history of parasomnia, sleep walking, night terrors.  2) now presenting with snoring, non restorative sleep and with frequent nocturia.   I would like to address the possibility of sleep apnea but there is certainly a long history of parasomnia of activity and sleep that would fits the diagnosis of night terrors-sleepwalking.  At the current stress level and the limited time that the patient can spend on her own needs there is also a level of exhaustion.  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  I would like to invite this patient for an in lab sleep study based on her results this is harder because of the needed care at home for her children.  What we should perhaps start is first doing a home sleep test to address the possibility of sleep apnea being present and once we have either ruled out that condition or treated at that condition we should plan for an in lab sleep study later this year.  HST first , if apnea is  present will treat,  if not, PSG / parasomnia montage  to follow.   I like for her to stay on adderall and trazodone for now.    I plan to follow up either personally or through our NP within 3-5 months.   I would like to thank Loyola Mast, MD and Loyola Mast, Md [address] [place],  [place] [address] for allowing me to meet with and to take care of this pleasant patient.   After spending a total time of  50  minutes face to face and additional time for physical and neurologic examination, review of laboratory studies,  personal review of imaging studies, reports and results of other testing and review of referral information / records as far as provided in visit,   Electronically signed by: Melvyn Novas, MD 04/02/2023 10:19 AM  [facility] Board certified by The [employer] of Sleep Medicine and Diplomate of the [employer] of Sleep Medicine. Board certified In Neurology through the ABPN, Fellow of the [employer] of Neu[employer].

## 2023-04-08 ENCOUNTER — Telehealth: Payer: Self-pay | Admitting: Neurology

## 2023-04-08 NOTE — Telephone Encounter (Signed)
HST- [facility] aetna no auth req   Patient is scheduled at [facility] for 04/30/23 at 2:30 pm.  Mailed packet to the patient.

## 2023-04-09 ENCOUNTER — Other Ambulatory Visit (HOSPITAL_BASED_OUTPATIENT_CLINIC_OR_DEPARTMENT_OTHER): Payer: Self-pay

## 2023-04-11 ENCOUNTER — Other Ambulatory Visit: Payer: Self-pay

## 2023-04-11 ENCOUNTER — Encounter: Payer: Self-pay | Admitting: Pharmacist

## 2023-04-11 ENCOUNTER — Other Ambulatory Visit (HOSPITAL_COMMUNITY): Payer: Self-pay

## 2023-04-16 ENCOUNTER — Ambulatory Visit: Payer: Commercial Managed Care - PPO | Admitting: Family Medicine

## 2023-04-16 ENCOUNTER — Other Ambulatory Visit (HOSPITAL_COMMUNITY): Payer: Self-pay

## 2023-04-16 VITALS — BP 110/58 | HR 92 | Temp 98.5°F | Ht 68.0 in | Wt 135.4 lb

## 2023-04-16 DIAGNOSIS — Z8632 Personal history of gestational diabetes: Secondary | ICD-10-CM | POA: Diagnosis not present

## 2023-04-16 DIAGNOSIS — F418 Other specified anxiety disorders: Secondary | ICD-10-CM

## 2023-04-16 DIAGNOSIS — Z13220 Encounter for screening for lipoid disorders: Secondary | ICD-10-CM | POA: Diagnosis not present

## 2023-04-16 NOTE — Progress Notes (Signed)
[facility] [address] [place] [address] Dept: [phone] Dept Fax: [phone]  Chronic Care Office Visit  Subjective:    Patient ID: Tracy Gallegos, female    DOB: June 02, 1986, 37 y.o..   MRN: [id number]  Chief Complaint  Patient presents with   Medical Management of Chronic Issues    6 month f/u.   No concerns.     History of Present Illness:  Patient is in today for reassessment of chronic medical issues.  Tracy Gallegos has a history of depression with anxiety. She is managed on escitalopram 15 mg daily, though she is typically only taking 10 mg daily. She feels her mood is improved. Her family life remains stressful, with caring for her children, including her son, Tracy Gallegos, who has significant health issues. Currently he is doing better, which she notes translates into her doing better with her emotions. She has had to balance her desire for order I her home with what is possible with three children and a spouse. She admits to challenges in getting any regular exercise, as her son requires close support throughout the day. He will be repeating pre-K next year.   Past Medical History: Patient Active Problem List   Diagnosis Date Noted   Nocturia 04/02/2023   Night terrors 04/02/2023   Abnormal dreams 04/02/2023   Snoring 04/02/2023   Personal history of colonic polyps 01/21/2023   Scarlet fever 01/21/2023   Irritable bowel syndrome (IBS) 10/16/2022   IUD (intrauterine device) in place 10/16/2022   Acne 10/16/2022   Hypersomnolence disorder 10/16/2022   Seborrhea 10/16/2021   Hair loss 10/16/2021   Chronic abdominal pain 08/02/2021   Fatigue 06/07/2019   Depression with anxiety 06/30/2018   History of gestational diabetes 06/30/2018   Sleep disorder, unspecified 06/30/2018   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   Anti-D antibodies present during pregnancy 10/14/17   Primary female infertility 09/15/2014    Fibromyalgia 08/03/2013   Past Surgical History:  Procedure Laterality Date   OVARIAN CYST SURGERY     WISDOM TOOTH EXTRACTION     Family History  Problem Relation Age of Onset   Obesity Mother    Depression Mother    Alcohol abuse Mother    Heart disease Father    Early death Father    Obesity Brother    Alcohol abuse Maternal Uncle    Depression Maternal Uncle    Hypertension Maternal Grandmother    Depression Maternal Grandmother    Alcohol abuse Maternal Grandmother    Alzheimer's disease Maternal Grandmother    Depression Maternal Grandfather    Stroke Maternal Grandfather    Heart disease Maternal Grandfather    Suicidality Maternal Grandfather    Suicidality Paternal Grandmother    Depression Paternal Grandmother    Heart disease Paternal Grandfather    Early death Paternal Grandfather    Outpatient Medications Prior to Visit  Medication Sig Dispense Refill   amphetamine-dextroamphetamine (ADDERALL) 10 MG tablet Take 1 tablet (10 mg total) by mouth daily with breakfast. 30 tablet 0   calcium carbonate (TUMS - DOSED IN MG ELEMENTAL CALCIUM) 500 MG chewable tablet Chew 1 tablet by mouth daily.     cetirizine (ZYRTEC) 10 MG tablet Take 10 mg by mouth daily. Daily as needed     clindamycin (CLINDAGEL) 1 % gel Apply topically 2 (two) times daily. 30 g 0   dicyclomine (BENTYL) 20 MG tablet Take 0.5 tablets (10 mg total) by mouth 3 (three) times  daily for abdominal pain. 45 tablet 5   escitalopram (LEXAPRO) 10 MG tablet Take 1.5 tablets (15 mg total) by mouth daily. 135 tablet 0   fexofenadine (ALLEGRA ALLERGY) 180 MG tablet Take 1 tablet (180 mg total) by mouth daily. 100 tablet 1   levonorgestrel (MIRENA) 20 MCG/DAY IUD 1 each by Intrauterine route once.     loratadine (CLARITIN) 10 MG tablet Take 10 mg by mouth daily as needed for allergies.     melatonin 3 MG TABS tablet Take 3 mg by mouth at bedtime.     metoprolol succinate (TOPROL-XL) 25 MG 24 hr tablet Take 1  tablet (25 mg total) by mouth daily. 90 tablet 3   mirabegron ER (MYRBETRIQ) 25 MG TB24 tablet Take 1 tablet (25 mg total) by mouth daily. 30 tablet 3   mometasone (ELOCON) 0.1 % cream Apply topically daily as needed for itching. 15 g 5   Multiple Vitamin (MULTIVITAMIN) tablet Take 1 tablet by mouth daily.     prednisoLONE acetate (PRED FORTE) 1 % ophthalmic suspension Place 4 drops into the right ear as needed for itching. 5 mL 5   spironolactone (ALDACTONE) 100 MG tablet Take 0.5 tablets (50 mg total) by mouth daily for 7 days, THEN 1 tablet (100 mg total) daily. 30 tablet 2   traZODone (DESYREL) 50 MG tablet Take 1/2 tablet (25 mg total) by mouth at bedtime as needed for sleep. 30 tablet 3   tretinoin (RETIN-A) 0.01 % gel Apply topically at bedtime. 15 g 0   naproxen (NAPROSYN) 500 MG tablet Take 1 tablet (500 mg total) by mouth as needed. Takes as needed only (Patient not taking: Reported on 04/16/2023) 30 tablet 2   No facility-administered medications prior to visit.   No Known Allergies Objective:   Today's Vitals   04/16/23 1426  BP: (!) 110/58  Pulse: 92  Temp: 98.5 F (36.9 C)  TempSrc: Temporal  SpO2: 97%  Weight: 135 lb 6.4 oz (61.4 kg)  Height: 5\' 8"  (1.727 m)   Body mass index is 20.59 kg/m.   General: Well developed, well nourished. No acute distress. Psych: Alert and oriented. Normal mood and affect.  Health Maintenance Due  Topic Date Due   Hepatitis C Screening  Never done   HPV VACCINES (2 - 3-dose SCDM series) 08/02/2020   PAP SMEAR-Modifier  09/12/2022      04/16/2023    2:47 PM 10/16/2022    3:57 PM 02/19/2022    4:19 PM  Depression screen PHQ 2/9  Decreased Interest 1 1 1   Down, Depressed, Hopeless 0 1 1  PHQ - 2 Score 1 2 2   Altered sleeping 1 1 0  Tired, decreased energy 3 3 3   Change in appetite 0 0 0  Feeling bad or failure about yourself  1 1 1   Trouble concentrating 0 1 0  Moving slowly or fidgety/restless 0 0 0  Suicidal thoughts 0 0 0   PHQ-9 Score 6 8 6   Difficult doing work/chores Somewhat difficult Somewhat difficult Somewhat difficult      04/16/2023    2:47 PM 10/16/2022    3:57 PM 08/02/2021   11:34 AM 04/13/2021    3:30 PM  GAD 7 : Generalized Anxiety Score  Nervous, Anxious, on Edge 3 1 3 3   Control/stop worrying 2 2 3 3   Worry too much - different things 3 2 3 3   Trouble relaxing 2 3 1 3   Restless 0 0 0 0  Easily annoyed  or irritable 3 3 3 3   Afraid - awful might happen 3 1 2 2   Total GAD 7 Score 16 12 15 17   Anxiety Difficulty Somewhat difficult Somewhat difficult Very difficult Very difficult     Assessment & Plan:   Problem List Items Addressed This Visit       Other   Depression with anxiety - Primary    Mood issues are stable. She notes she prefers to have some anxiety vs. being overmedicated which makes it harder for her to manage issues at home. I will continue escitalopram 10-15 mg daily.      Relevant Orders   CBC   Comprehensive metabolic panel   History of gestational diabetes    Upcoming physical exam. I will preorder annual labs for screening. Recommend annual DM screening.      Relevant Orders   Comprehensive metabolic panel   Hemoglobin A1c   Other Visit Diagnoses     Screening for lipid disorders       Relevant Orders   Lipid panel       Return for Follow-up as scheduled.   Loyola Mast, MD

## 2023-04-16 NOTE — Assessment & Plan Note (Signed)
Mood issues are stable. She notes she prefers to have some anxiety vs. being overmedicated which makes it harder for her to manage issues at home. I will continue escitalopram 10-15 mg daily.

## 2023-04-16 NOTE — Assessment & Plan Note (Signed)
Upcoming physical exam. I will preorder annual labs for screening. Recommend annual DM screening.

## 2023-04-24 ENCOUNTER — Emergency Department (HOSPITAL_COMMUNITY): Payer: Commercial Managed Care - PPO

## 2023-04-24 ENCOUNTER — Encounter (HOSPITAL_COMMUNITY): Payer: Self-pay

## 2023-04-24 ENCOUNTER — Other Ambulatory Visit: Payer: Self-pay

## 2023-04-24 ENCOUNTER — Emergency Department (HOSPITAL_COMMUNITY)
Admission: EM | Admit: 2023-04-24 | Discharge: 2023-04-24 | Disposition: A | Discharge status: Home / Self Care | Payer: Commercial Managed Care - PPO | Attending: Emergency Medicine | Admitting: Emergency Medicine

## 2023-04-24 DIAGNOSIS — R4701 Aphasia: Secondary | ICD-10-CM | POA: Insufficient documentation

## 2023-04-24 DIAGNOSIS — F419 Anxiety disorder, unspecified: Secondary | ICD-10-CM | POA: Insufficient documentation

## 2023-04-24 DIAGNOSIS — R519 Headache, unspecified: Secondary | ICD-10-CM | POA: Diagnosis not present

## 2023-04-24 DIAGNOSIS — R42 Dizziness and giddiness: Secondary | ICD-10-CM | POA: Diagnosis not present

## 2023-04-24 DIAGNOSIS — Z79899 Other long term (current) drug therapy: Secondary | ICD-10-CM | POA: Insufficient documentation

## 2023-04-24 DIAGNOSIS — R29818 Other symptoms and signs involving the nervous system: Secondary | ICD-10-CM | POA: Diagnosis not present

## 2023-04-24 LAB — URINALYSIS, ROUTINE W REFLEX MICROSCOPIC
Bacteria, UA: NONE SEEN
Bilirubin Urine: NEGATIVE
Glucose, UA: NEGATIVE mg/dL
Ketones, ur: 20 mg/dL — AB
Leukocytes,Ua: NEGATIVE
Nitrite: NEGATIVE
Protein, ur: NEGATIVE mg/dL
Specific Gravity, Urine: 1.002 — ABNORMAL LOW (ref 1.005–1.030)
pH: 8 (ref 5.0–8.0)

## 2023-04-24 LAB — COMPREHENSIVE METABOLIC PANEL
ALT: 16 U/L (ref 0–44)
AST: 24 U/L (ref 15–41)
Albumin: 4 g/dL (ref 3.5–5.0)
Alkaline Phosphatase: 43 U/L (ref 38–126)
Anion gap: 13 (ref 5–15)
BUN: 10 mg/dL (ref 6–20)
CO2: 18 mmol/L — ABNORMAL LOW (ref 22–32)
Calcium: 8.6 mg/dL — ABNORMAL LOW (ref 8.9–10.3)
Chloride: 105 mmol/L (ref 98–111)
Creatinine, Ser: 0.88 mg/dL (ref 0.44–1.00)
GFR, Estimated: >60 mL/min (ref >60)
Glucose, Bld: 92 mg/dL (ref 70–99)
Potassium: 3.4 mmol/L — ABNORMAL LOW (ref 3.5–5.1)
Sodium: 136 mmol/L (ref 135–145)
Total Bilirubin: 2.1 mg/dL — ABNORMAL HIGH (ref 0.3–1.2)
Total Protein: 6.4 g/dL — ABNORMAL LOW (ref 6.5–8.1)

## 2023-04-24 LAB — CBC WITH DIFFERENTIAL/PLATELET
Abs Immature Granulocytes: 0.03 10*3/uL (ref 0.00–0.07)
Basophils Absolute: 0.1 10*3/uL (ref 0.0–0.1)
Basophils Relative: 0 %
Eosinophils Absolute: 0 10*3/uL (ref 0.0–0.5)
Eosinophils Relative: 0 %
HCT: 37.2 % (ref 36.0–46.0)
Hemoglobin: 12.9 g/dL (ref 12.0–15.0)
Immature Granulocytes: 0 %
Lymphocytes Relative: 16 %
Lymphs Abs: 2 10*3/uL (ref 0.7–4.0)
MCH: 31.1 pg (ref 26.0–34.0)
MCHC: 34.7 g/dL (ref 30.0–36.0)
MCV: 89.6 fL (ref 80.0–100.0)
Monocytes Absolute: 0.6 10*3/uL (ref 0.1–1.0)
Monocytes Relative: 5 %
Neutro Abs: 9.6 10*3/uL — ABNORMAL HIGH (ref 1.7–7.7)
Neutrophils Relative %: 79 %
Platelets: 228 10*3/uL (ref 150–400)
RBC: 4.15 MIL/uL (ref 3.87–5.11)
RDW: 12.3 % (ref 11.5–15.5)
WBC: 12.3 10*3/uL — ABNORMAL HIGH (ref 4.0–10.5)
nRBC: 0 % (ref 0.0–0.2)

## 2023-04-24 LAB — I-STAT CHEM 8, ED
BUN: 10 mg/dL (ref 6–20)
Calcium, Ion: 1.07 mmol/L — ABNORMAL LOW (ref 1.15–1.40)
Chloride: 104 mmol/L (ref 98–111)
Creatinine, Ser: 0.8 mg/dL (ref 0.44–1.00)
Glucose, Bld: 89 mg/dL (ref 70–99)
HCT: 39 % (ref 36.0–46.0)
Hemoglobin: 13.3 g/dL (ref 12.0–15.0)
Potassium: 3.4 mmol/L — ABNORMAL LOW (ref 3.5–5.1)
Sodium: 136 mmol/L (ref 135–145)
TCO2: 19 mmol/L — ABNORMAL LOW (ref 22–32)

## 2023-04-24 LAB — RAPID URINE DRUG SCREEN, HOSP PERFORMED
Amphetamines: NOT DETECTED
Barbiturates: NOT DETECTED
Benzodiazepines: NOT DETECTED
Cocaine: NOT DETECTED
Opiates: NOT DETECTED
Tetrahydrocannabinol: NOT DETECTED

## 2023-04-24 LAB — I-STAT BETA HCG BLOOD, ED (MC, WL, AP ONLY): I-stat hCG, quantitative: <5 m[IU]/mL (ref <5)

## 2023-04-24 MED ORDER — LACTATED RINGERS IV BOLUS
1000.0000 mL | Freq: Once | INTRAVENOUS | Status: AC
  Administered 2023-04-24: 1000 mL via INTRAVENOUS

## 2023-04-24 MED ORDER — METOCLOPRAMIDE HCL 5 MG/ML IJ SOLN
5.0000 mg | Freq: Once | INTRAMUSCULAR | Status: AC
  Administered 2023-04-24: 5 mg via INTRAVENOUS
  Filled 2023-04-24: qty 2

## 2023-04-24 MED ORDER — ONDANSETRON HCL 4 MG/2ML IJ SOLN
4.0000 mg | Freq: Once | INTRAMUSCULAR | Status: AC
  Administered 2023-04-24: 4 mg via INTRAVENOUS
  Filled 2023-04-24: qty 2

## 2023-04-24 MED ORDER — DEXAMETHASONE SODIUM PHOSPHATE 10 MG/ML IJ SOLN
10.0000 mg | Freq: Once | INTRAMUSCULAR | Status: AC
  Administered 2023-04-24: 10 mg via INTRAVENOUS
  Filled 2023-04-24: qty 1

## 2023-04-24 MED ORDER — LORAZEPAM 2 MG/ML IJ SOLN
1.0000 mg | Freq: Once | INTRAMUSCULAR | Status: AC
  Administered 2023-04-24: 1 mg via INTRAVENOUS
  Filled 2023-04-24: qty 1

## 2023-04-24 MED ORDER — DIPHENHYDRAMINE HCL 50 MG/ML IJ SOLN
25.0000 mg | Freq: Once | INTRAMUSCULAR | Status: AC
  Administered 2023-04-24: 25 mg via INTRAVENOUS
  Filled 2023-04-24: qty 1

## 2023-04-24 MED ORDER — ACETAMINOPHEN 500 MG PO TABS
1000.0000 mg | ORAL_TABLET | Freq: Once | ORAL | Status: AC
  Administered 2023-04-24: 1000 mg via ORAL
  Filled 2023-04-24: qty 2

## 2023-04-24 NOTE — ED Provider Notes (Signed)
[place] [facility]ERGENCY DEPARTMENT AT [facility] Provider Note   CSN: [id number] Arrival date & time: 04/24/23  1411     History  Chief Complaint  Patient presents with   Aphasia    Tracy Gallegos is a 37 y.o. female.  HPI Patient reports she had a normal morning.  She woke up at about 7 AM feeling well.  She had a light breakfast and went to the gym to do some working out.  She had done some usual weightlifting and afterward was doing some lighter work out.  She reports very suddenly at noon she felt very dizzy.  Quickly after that there was a left-sided headache.  Patient reports she then felt like she had difficulty with her gait feeling unsteady and difficulty getting her words out.  She reports at that point she also then felt very anxious and started breathing rapidly.  She started getting a lot of tingling around her face and hands.  No syncopal episode.  Patient reports that she has had a couple of isolated migraines in her life.  No history of frequent headaches.    Home Medications Prior to Admission medications   Medication Sig Start Date End Date Taking? Authorizing Provider  amphetamine-dextroamphetamine (ADDERALL) 10 MG tablet Take 1 tablet (10 mg total) by mouth daily with breakfast. 03/03/23   McElwee, Lauren A, NP  calcium carbonate (TUMS - DOSED IN MG ELEMENTAL CALCIUM) 500 MG chewable tablet Chew 1 tablet by mouth daily.    [provider]  cetirizine (ZYRTEC) 10 MG tablet Take 10 mg by mouth daily. Daily as needed    [provider]  clindamycin (CLINDAGEL) 1 % gel Apply topically 2 (two) times daily. 03/19/23   Freddy Finner, NP  dicyclomine (BENTYL) 20 MG tablet Take 0.5 tablets (10 mg total) by mouth 3 (three) times daily for abdominal pain. 01/20/23     escitalopram (LEXAPRO) 10 MG tablet Take 1.5 tablets (15 mg total) by mouth daily. 03/03/23   Loyola Mast, MD  fexofenadine [facility] ALLERGY) 180 MG tablet Take 1 tablet (180 mg  total) by mouth daily. 03/23/23   Junie Spencer, FNP  levonorgestrel (MIRENA) 20 MCG/DAY IUD 1 each by Intrauterine route once.    [provider]  loratadine (CLARITIN) 10 MG tablet Take 10 mg by mouth daily as needed for allergies.    [provider]  melatonin 3 MG TABS tablet Take 3 mg by mouth at bedtime.    [provider]  metoprolol succinate (TOPROL-XL) 25 MG 24 hr tablet Take 1 tablet (25 mg total) by mouth daily. 02/21/22   Mliss Sax, MD  mirabegron ER (MYRBETRIQ) 25 MG TB24 tablet Take 1 tablet (25 mg total) by mouth daily. 08/21/22     mometasone (ELOCON) 0.1 % cream Apply topically daily as needed for itching. 12/31/22   Newman Pies, MD  Multiple Vitamin (MULTIVITAMIN) tablet Take 1 tablet by mouth daily.    [provider]  naproxen (NAPROSYN) 500 MG tablet Take 1 tablet (500 mg total) by mouth as needed. Takes as needed only Patient not taking: Reported on 04/16/2023 01/21/23   Loyola Mast, MD  prednisoLONE acetate (PRED FORTE) 1 % ophthalmic suspension Place 4 drops into the right ear as needed for itching. 08/16/22   Newman Pies, MD  spironolactone (ALDACTONE) 100 MG tablet Take 0.5 tablets (50 mg total) by mouth daily for 7 days, THEN 1 tablet (100 mg total) daily. 10/30/22 04/19/23  Loyola Mast, MD  traZODone (DESYREL) 50 MG tablet Take 1/2 tablet (25 mg total) by mouth at bedtime as needed for sleep. 04/10/22   Loyola Mast, MD  tretinoin (RETIN-A) 0.01 % gel Apply topically at bedtime. 03/19/23   Freddy Finner, NP      Allergies    Patient has no known allergies.    Review of Systems   Review of Systems  Physical Exam Updated Vital Signs BP 105/72   Pulse 83   Temp 98.4 F (36.9 C) (Oral)   Resp 16   Ht 5\' 8"  (1.727 m)   Wt 63.5 kg   SpO2 100%   BMI 21.29 kg/m  Physical Exam Constitutional:      Comments: Alert.  Well-nourished well-developed.  Hyperventilating and tearful.  Mental status clear.  HENT:      Head: Normocephalic and atraumatic.     Nose: Nose normal.     Mouth/Throat:     Mouth: Mucous membranes are moist.     Pharynx: Oropharynx is clear.  Eyes:     Extraocular Movements: Extraocular movements intact.     Conjunctiva/sclera: Conjunctivae normal.     Pupils: Pupils are equal, round, and reactive to light.  Cardiovascular:     Rate and Rhythm: Normal rate and regular rhythm.  Pulmonary:     Effort: Pulmonary effort is normal.     Breath sounds: Normal breath sounds.  Abdominal:     General: There is no distension.     Palpations: Abdomen is soft.     Tenderness: There is no abdominal tenderness. There is no guarding.  Musculoskeletal:        General: No swelling or tenderness. Normal range of motion.     Cervical back: Neck supple.     Right lower leg: No edema.     Left lower leg: No edema.  Skin:    General: Skin is warm and dry.  Neurological:     Comments: Mental status clear.  Cranial nerves II through XII intact.  No visual field cuts.  Extraocular motions normal.  Finger-nose exam intact.  Motor strength 5/5 upper and lower extremities.  Psychiatric:     Comments: Patient is intermittently tearful and hyperventilating but also situationally focused and appropriately interactive.     ED Results / Procedures / Treatments   Labs (all labs ordered are listed, but only abnormal results are displayed) Labs Reviewed  COMPREHENSIVE METABOLIC PANEL - Abnormal; Notable for the following components:      Result Value   Potassium 3.4 (*)    CO2 18 (*)    Calcium 8.6 (*)    Total Protein 6.4 (*)    Total Bilirubin 2.1 (*)    All other components within normal limits  CBC WITH DIFFERENTIAL/PLATELET - Abnormal; Notable for the following components:   WBC 12.3 (*)    Neutro Abs 9.6 (*)    All other components within normal limits  URINALYSIS, ROUTINE W REFLEX MICROSCOPIC - Abnormal; Notable for the following components:   Color, Urine COLORLESS (*)    Specific  Gravity, Urine 1.002 (*)    Hgb urine dipstick SMALL (*)    Ketones, ur 20 (*)    All other components within normal limits  I-STAT CHEM 8, ED - Abnormal; Notable for the following components:   Potassium 3.4 (*)    Calcium, Ion 1.07 (*)    TCO2 19 (*)    All other components within normal limits  RAPID  URINE DRUG SCREEN, HOSP PERFORMED  I-STAT BETA HCG BLOOD, ED ([facility], [facility], [facility] ONLY)    EKG EKG Interpretation  Date/Time:  Thursday April 24 2023 14:13:23 EDT Ventricular Rate:  90 PR Interval:  161 QRS Duration: 111 QT Interval:  395 QTC Calculation: 484 R Axis:   81 Text Interpretation: Age not entered, assumed to be  37 years old for purpose of ECG interpretation Sinus rhythm RSR' in V1 or V2, right VCD or RVH Borderline prolonged QT interval artifact. no acute ischemic appearance or dysrythmia Confirmed by Arby Barrette [phone]) on 04/24/2023 3:28:45 PM  Radiology No results found.  Procedures Procedures    Medications Ordered in ED Medications  metoCLOPramide (REGLAN) injection 5 mg (5 mg Intravenous Given 04/24/23 1451)  diphenhydrAMINE (BENADRYL) injection 25 mg (25 mg Intravenous Given 04/24/23 1451)  lactated ringers bolus 1,000 mL (1,000 mLs Intravenous Bolus 04/24/23 1451)  LORazepam (ATIVAN) injection 1 mg (1 mg Intravenous Given 04/24/23 1538)  acetaminophen (TYLENOL) tablet 1,000 mg (1,000 mg Oral Given 04/24/23 1538)    ED Course/ Medical Decision Making/ A&P                             Medical Decision Making Amount and/or Complexity of Data Reviewed Labs: ordered. Radiology: ordered.  Risk OTC drugs. Prescription drug management.  Patient at baseline and is healthy.  She had acute onset of dizziness and headache with word finding difficulty and hyperventilating.  Differential diagnosis includes CVA\vertebrobasilar dissection\migraine\stress panic attack\positional vertigo.  Will proceed with diagnostic evaluation and symptomatic treatment.  Reglan and  Benadryl and fluid resuscitation ordered.  Consult: Reviewed with neurology dr. Viviann Spare. Agrees with CT head with angiogram followed by MRI if CT negative  15: 52 headache has improved.  Patient is more relaxed.  Vital signs are stable.  Awaiting CT.  Continue to monitor for symptomatic treatment.  Patient's neurologic exam is normal and intact.  Mental status is clear.  At this time if CT and MRI are within normal limits, suspect migraine with possible panic attack component with hyperventilating and paresthesia.  Dr. Anitra Lauth to follow-up on test results.          Final Clinical Impression(s) / ED Diagnoses Final diagnoses:  Acute nonintractable headache, unspecified headache type  Vertigo    Rx / DC Orders ED Discharge Orders     None         Arby Barrette, MD 04/24/23 [phone]

## 2023-04-24 NOTE — ED Triage Notes (Signed)
Pt present to ED with noted expressive aphasia. Pt stated to EMS of having a headache this morning. C/o slight headache at this time. Pt noted repeating words, unsteady gait noted. Last well known time per the husband last night at 2100.

## 2023-04-24 NOTE — ED Provider Notes (Signed)
I assumed care from Dr. Clarice Pole and patient's MRI has returned and it is negative for any acute pathology.  Patient had not received her CTA at this time but after discussion we are deferring CTA as feel that likelihood of aneurysm or dissection is low.  Suspect patient had a complex migraine today.  She does have a history of migraines but reports she has never had 1 like this before.  Her husband is now present at bedside and discussed all the findings with them.  At this time patient is at her baseline and feel that she is stable for discharge home but will do an outpatient referral to neurology.  They are comfortable with this plan and no symptoms that would cause return.   Tracy Sprout, MD 04/24/23 1946

## 2023-04-24 NOTE — Discharge Instructions (Signed)
The MRI today was normal.  Your lab work overall also looks well.  If you start getting more frequent headaches before you are able to see neurology follow-up with your regular doctor.

## 2023-04-24 NOTE — ED Notes (Signed)
Patient ambulated to the restroom with assistance.

## 2023-04-25 ENCOUNTER — Telehealth: Payer: Self-pay

## 2023-04-25 NOTE — Transitions of Care (Post Inpatient/ED Visit) (Signed)
04/25/2023  Name: Tracy Gallegos MRN: [id number] DOB: 1986/04/17  Today's TOC FU Call Status: Today's TOC FU Call Status:: Successful TOC FU Call Competed TOC FU Call Complete Date: 04/25/23  Transition Care Management Follow-up Telephone Call Date of Discharge: 04/24/23 Discharge Facility: [facility]) Type of Discharge: Emergency Department How have you been since you were released from the hospital?: Better  Items Reviewed: Did you receive and understand the discharge instructions provided?: Yes Any new allergies since your discharge?: No Dietary orders reviewed?: NA Do you have support at home?: Yes  Medications Reviewed Today: Medications Reviewed Today     Reviewed by Larey Dresser, RN (Registered Nurse) on 04/25/23 at 1038  Med List Status: <None>   Medication Order Taking? Sig Documenting Provider Last Dose Status Informant  amphetamine-dextroamphetamine (ADDERALL) 10 MG tablet [id number]  Take 1 tablet (10 mg total) by mouth daily with breakfast. McElwee, Lauren A, NP  Active   calcium carbonate (TUMS - DOSED IN MG ELEMENTAL CALCIUM) 500 MG chewable tablet [id number]  Chew 1 tablet by mouth daily. [provider]  Active   cetirizine (ZYRTEC) 10 MG tablet [id number]  Take 10 mg by mouth daily. Daily as needed [provider]  Active   clindamycin (CLINDAGEL) 1 % gel [id number]  Apply topically 2 (two) times daily. Freddy Finner, NP  Active   dicyclomine (BENTYL) 20 MG tablet [id number]  Take 0.5 tablets (10 mg total) by mouth 3 (three) times daily for abdominal pain.   Active   escitalopram (LEXAPRO) 10 MG tablet [id number]  Take 1.5 tablets (15 mg total) by mouth daily. Loyola Mast, MD  Active   fexofenadine [facility] ALLERGY) 180 MG tablet [id number]  Take 1 tablet (180 mg total) by mouth daily. Jannifer Rodney A, FNP  Active   levonorgestrel (MIRENA) 20 MCG/DAY IUD [id number]  1 each by Intrauterine route once. [provider]   Active   loratadine (CLARITIN) 10 MG tablet [id number]  Take 10 mg by mouth daily as needed for allergies. [provider]  Active   melatonin 3 MG TABS tablet [id number]  Take 3 mg by mouth at bedtime. [provider]  Active   metoprolol succinate (TOPROL-XL) 25 MG 24 hr tablet [id number]  Take 1 tablet (25 mg total) by mouth daily. Mliss Sax, MD  Active   mirabegron ER [facility]) 25 MG TB24 tablet [id number]  Take 1 tablet (25 mg total) by mouth daily.   Active   mometasone (ELOCON) 0.1 % cream [id number]  Apply topically daily as needed for itching. Newman Pies, MD  Active   Multiple Vitamin (MULTIVITAMIN) tablet [id number]  Take 1 tablet by mouth daily. [provider]  Active   naproxen (NAPROSYN) 500 MG tablet [id number] No Take 1 tablet (500 mg total) by mouth as needed. Takes as needed only  Patient not taking: Reported on 04/16/2023   Loyola Mast, MD Not Taking Active   prednisoLONE acetate (PRED FORTE) 1 % ophthalmic suspension [id number]  Place 4 drops into the right ear as needed for itching. Newman Pies, MD  Active   spironolactone (ALDACTONE) 100 MG tablet [id number]  Take 0.5 tablets (50 mg total) by mouth daily for 7 days, THEN 1 tablet (100 mg total) daily. Loyola Mast, MD  Expired 04/19/23 2359   traZODone (DESYREL) 50 MG tablet [id number]  Take 1/2 tablet (25 mg total) by mouth at bedtime as needed for sleep. Loyola Mast, MD  Active  tretinoin (RETIN-A) 0.01 % gel [id number]  Apply topically at bedtime. Freddy Finner, NP  Active             Home Care and Equipment/Supplies: Were Home Health Services Ordered?: NA Any new equipment or medical supplies ordered?: NA  Functional Questionnaire: Do you need assistance with bathing/showering or dressing?: No Do you need assistance with meal preparation?: No Do you need assistance with eating?: No Do you have difficulty maintaining continence: No Do you need assistance with getting out of  bed/getting out of a chair/moving?: No Do you have difficulty managing or taking your medications?: No  Follow up appointments reviewed: PCP Follow-up appointment confirmed?: Yes Date of PCP follow-up appointment?: 04/30/23 Follow-up Provider: [facility] Follow-up appointment confirmed?: Yes Do you need transportation to your follow-up appointment?: No Do you understand care options if your condition(s) worsen?: Yes-patient verbalized understanding    SIGNATURE Arvil Persons, BSN, RN

## 2023-04-30 ENCOUNTER — Ambulatory Visit: Payer: Commercial Managed Care - PPO | Admitting: Neurology

## 2023-04-30 ENCOUNTER — Ambulatory Visit (INDEPENDENT_AMBULATORY_CARE_PROVIDER_SITE_OTHER): Payer: Commercial Managed Care - PPO | Admitting: Family Medicine

## 2023-04-30 ENCOUNTER — Encounter: Payer: Self-pay | Admitting: Family Medicine

## 2023-04-30 VITALS — BP 110/70 | HR 72 | Temp 97.9°F | Ht 68.0 in | Wt 133.6 lb

## 2023-04-30 DIAGNOSIS — F519 Sleep disorder not due to a substance or known physiological condition, unspecified: Secondary | ICD-10-CM

## 2023-04-30 DIAGNOSIS — Z1159 Encounter for screening for other viral diseases: Secondary | ICD-10-CM | POA: Diagnosis not present

## 2023-04-30 DIAGNOSIS — Z0000 Encounter for general adult medical examination without abnormal findings: Secondary | ICD-10-CM | POA: Diagnosis not present

## 2023-04-30 DIAGNOSIS — F514 Sleep terrors [night terrors]: Secondary | ICD-10-CM

## 2023-04-30 DIAGNOSIS — R351 Nocturia: Secondary | ICD-10-CM

## 2023-04-30 DIAGNOSIS — F518 Other sleep disorders not due to a substance or known physiological condition: Secondary | ICD-10-CM

## 2023-04-30 DIAGNOSIS — G4733 Obstructive sleep apnea (adult) (pediatric): Secondary | ICD-10-CM

## 2023-04-30 DIAGNOSIS — R0683 Snoring: Secondary | ICD-10-CM

## 2023-04-30 DIAGNOSIS — G43109 Migraine with aura, not intractable, without status migrainosus: Secondary | ICD-10-CM | POA: Diagnosis not present

## 2023-04-30 DIAGNOSIS — R7401 Elevation of levels of liver transaminase levels: Secondary | ICD-10-CM

## 2023-04-30 DIAGNOSIS — Z13220 Encounter for screening for lipoid disorders: Secondary | ICD-10-CM

## 2023-04-30 NOTE — Progress Notes (Signed)
[facility] [address] [facility] [address] [place] [address] Dept: [phone] Dept Fax: [phone]  Annual Physical Visit  Subjective:    Patient ID: Tracy Gallegos, female    DOB: Mar 04, 1986, 37 y.o..   MRN: [id number]  Chief Complaint  Patient presents with   Annual Exam    Fasting   History of Present Illness:  Patient is in today for an annual physical/preventative visit.  Ms. Nobel had a recent episode resulting in an ED visit. She had gone to the gym on 6/13 for a workout. Afterwards, while going to check her kids out of childcare, she had sudden loss of vision in her right eye, followed soon after by an intense left-sided headache. She noted some associated  issues with difficulty remembering her children's names. She was able to get her children into the car, but notes a bystander asked about her welfare, as she was slurring some words. She proceeded to try and drive home. She noted multiple text messages on her phone, but could not recognize who the people were that were texting her. She called her husband, who advised her to pull over and he called for EMS. She was seen and evaluated at [facility]. She notes the ocular symptoms resolved over a period of 2 hours.  Ms. Alaimo has a history of prior migraine headaches, first developing in her early 13s. The few episodes she has had were accompanied by visual aura. She typically took a migraine "cocktail" and these resolved. She has been doing well since, though she notes possibly a haziness in her peripheral vision on the right.  Review of Systems  Constitutional:  Negative for chills, diaphoresis, fever, malaise/fatigue and weight loss.  HENT:  Negative for congestion, ear pain, hearing loss, sinus pain, sore throat and tinnitus.   Eyes:  Positive for blurred vision. Negative for double vision, pain, discharge and redness.       See above history.  Respiratory:  Negative for  cough, shortness of breath and wheezing.   Cardiovascular:  Negative for chest pain and palpitations.  Gastrointestinal:  Negative for abdominal pain, constipation, diarrhea, heartburn, nausea and vomiting.  Musculoskeletal:  Negative for back pain, joint pain and myalgias.  Skin:  Negative for itching and rash.  Neurological:  Positive for speech change and headaches. Negative for seizures, loss of consciousness and weakness.       See above history  Psychiatric/Behavioral:  Negative for depression. The patient is not nervous/anxious.    Past Medical History: Patient Active Problem List   Diagnosis Date Noted   Nocturia 04/02/2023   Night terrors 04/02/2023   Abnormal dreams 04/02/2023   Snoring 04/02/2023   Personal history of colonic polyps 01/21/2023   Scarlet fever 01/21/2023   Irritable bowel syndrome (IBS) 10/16/2022   IUD (intrauterine device) in place 10/16/2022   Acne 10/16/2022   Hypersomnolence disorder 10/16/2022   Seborrhea 10/16/2021   Hair loss 10/16/2021   Chronic abdominal pain 08/02/2021   Fatigue 06/07/2019   Depression with anxiety 06/30/2018   History of gestational diabetes 06/30/2018   Sleep disorder, unspecified 06/30/2018   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   Anti-D antibodies present during pregnancy 14-Oct-2017   Primary female infertility 09/15/2014   Fibromyalgia 08/03/2013   Past Surgical History:  Procedure Laterality Date   OVARIAN CYST SURGERY     WISDOM TOOTH EXTRACTION     Family History  Problem Relation Age of Onset   Obesity Mother    Depression  Mother    Alcohol abuse Mother    Heart disease Father    Early death Father    Obesity Brother    Alcohol abuse Maternal Uncle    Depression Maternal Uncle    Hypertension Maternal Grandmother    Depression Maternal Grandmother    Alcohol abuse Maternal Grandmother    Alzheimer's disease Maternal Grandmother    Depression Maternal Grandfather    Stroke Maternal Grandfather    Heart  disease Maternal Grandfather    Suicidality Maternal Grandfather    Suicidality Paternal Grandmother    Depression Paternal Grandmother    Heart disease Paternal Grandfather    Early death Paternal Grandfather    Outpatient Medications Prior to Visit  Medication Sig Dispense Refill   amphetamine-dextroamphetamine (ADDERALL) 10 MG tablet Take 1 tablet (10 mg total) by mouth daily with breakfast. 30 tablet 0   calcium carbonate (TUMS - DOSED IN MG ELEMENTAL CALCIUM) 500 MG chewable tablet Chew 1 tablet by mouth daily.     cetirizine (ZYRTEC) 10 MG tablet Take 10 mg by mouth daily. Daily as needed     clindamycin (CLINDAGEL) 1 % gel Apply topically 2 (two) times daily. 30 g 0   dicyclomine (BENTYL) 20 MG tablet Take 0.5 tablets (10 mg total) by mouth 3 (three) times daily for abdominal pain. 45 tablet 5   escitalopram (LEXAPRO) 10 MG tablet Take 1.5 tablets (15 mg total) by mouth daily. 135 tablet 0   fexofenadine (ALLEGRA ALLERGY) 180 MG tablet Take 1 tablet (180 mg total) by mouth daily. 100 tablet 1   levonorgestrel (MIRENA) 20 MCG/DAY IUD 1 each by Intrauterine route once.     loratadine (CLARITIN) 10 MG tablet Take 10 mg by mouth daily as needed for allergies.     melatonin 3 MG TABS tablet Take 3 mg by mouth at bedtime.     metoprolol succinate (TOPROL-XL) 25 MG 24 hr tablet Take 1 tablet (25 mg total) by mouth daily. 90 tablet 3   mirabegron ER (MYRBETRIQ) 25 MG TB24 tablet Take 1 tablet (25 mg total) by mouth daily. 30 tablet 3   mometasone (ELOCON) 0.1 % cream Apply topically daily as needed for itching. 15 g 5   Multiple Vitamin (MULTIVITAMIN) tablet Take 1 tablet by mouth daily.     naproxen (NAPROSYN) 500 MG tablet Take 1 tablet (500 mg total) by mouth as needed. Takes as needed only 30 tablet 2   prednisoLONE acetate (PRED FORTE) 1 % ophthalmic suspension Place 4 drops into the right ear as needed for itching. 5 mL 5   traZODone (DESYREL) 50 MG tablet Take 1/2 tablet (25 mg total)  by mouth at bedtime as needed for sleep. 30 tablet 3   tretinoin (RETIN-A) 0.01 % gel Apply topically at bedtime. 15 g 0   spironolactone (ALDACTONE) 100 MG tablet Take 0.5 tablets (50 mg total) by mouth daily for 7 days, THEN 1 tablet (100 mg total) daily. 30 tablet 2   No facility-administered medications prior to visit.   Allergies  Allergen Reactions   Benadryl [Diphenhydramine] Other (See Comments)    Cramping muscles.   Objective:   Today's Vitals   04/30/23 1553  BP: 110/70  Pulse: 72  Temp: 97.9 F (36.6 C)  TempSrc: Temporal  SpO2: 99%  Weight: 133 lb 9.6 oz (60.6 kg)  Height: 5\' 8"  (1.727 m)   Body mass index is 20.31 kg/m.   General: Well developed, well nourished. No acute distress. HEENT: Normocephalic, non-traumatic.  PERRL, EOMI. Conjunctiva clear. External ears normal. EAC   and TMs normal bilaterally. Nose clear without congestion or rhinorrhea. Mucous membranes moist.   Oropharynx clear. Good dentition. Neck: Supple. No lymphadenopathy. No thyromegaly. Lungs: Clear to auscultation bilaterally. No wheezing, rales or rhonchi. CV: RRR without murmurs or rubs. Pulses 2+ bilaterally. Abdomen: Soft, non-tender. Bowel sounds positive, normal pitch and frequency. No   hepatosplenomegaly. No rebound or guarding. Back: Straight. No CVA tenderness bilaterally. Extremities: Full ROM. No joint swelling or tenderness. No edema noted. Skin: Warm and dry. No rashes. Psych: Alert and oriented. Normal mood and affect.  Health Maintenance Due  Topic Date Due   Hepatitis C Screening  Never done   HPV VACCINES (2 - 3-dose SCDM series) 08/02/2020   Lab Results    Latest Ref Rng & Units 04/24/2023    3:55 PM 04/24/2023    2:37 PM 02/04/2020    5:17 PM  CBC  WBC 4.0 - 10.5 K/uL  12.3  10.8   Hemoglobin 12.0 - 15.0 g/dL 96.0  45.4  09.8   Hematocrit 36.0 - 46.0 % 39.0  37.2  38.2   Platelets 150 - 400 K/uL  228  250       Latest Ref Rng & Units 04/24/2023    3:55 PM  04/24/2023    2:37 PM  CMP  Glucose 70 - 99 mg/dL 89  92   BUN 6 - 20 mg/dL 10  10   Creatinine 1.19 - 1.00 mg/dL 1.47  8.29   Sodium 562 - 145 mmol/L 136  136   Potassium 3.5 - 5.1 mmol/L 3.4  3.4   Chloride 98 - 111 mmol/L 104  105   CO2 22 - 32 mmol/L  18   Calcium 8.9 - 10.3 mg/dL  8.6   Total Protein 6.5 - 8.1 g/dL  6.4   Total Bilirubin 0.3 - 1.2 mg/dL  2.1   Alkaline Phos 38 - 126 U/L  43   AST 15 - 41 U/L  24   ALT 0 - 44 U/L  16    Imaging: MR of Brain (04/24/2023) IMPRESSION: No acute intracranial process.  Assessment & Plan:   Problem List Items Addressed This Visit       Cardiovascular and Mediastinum   Ocular migraine    The recent episode appears to have been a complex migraine/ocular migraine. I am concerned about potential stroke risk and the residual peripheral blurred vision. Ms. Ratliff has an optometry appointment for tomorrow. She was referred back to her neurologist to assess her recent migraine. I will follow expectantly for their advice on management.      Relevant Orders   Comprehensive metabolic panel   Other Visit Diagnoses     Annual physical exam    -  Primary   Overall health, outside of the recent event, is excellent. Encourage regular physical activity for CV and mental health. Follow-up with GYN for pap smear.   Screening for lipid disorders       Relevant Orders   Lipid panel   Encounter for hepatitis C screening test for low risk patient       Relevant Orders   HCV Ab w Reflex to Quant PCR       Return in about 2 months (around 06/30/2023) for Reassessment.   Loyola Mast, MD

## 2023-04-30 NOTE — Assessment & Plan Note (Signed)
The recent episode appears to have been a complex migraine/ocular migraine. I am concerned about potential stroke risk and the residual peripheral blurred vision. Tracy Gallegos has an optometry appointment for tomorrow. She was referred back to her neurologist to assess her recent migraine. I will follow expectantly for their advice on management.

## 2023-05-01 DIAGNOSIS — R7401 Elevation of levels of liver transaminase levels: Secondary | ICD-10-CM | POA: Insufficient documentation

## 2023-05-01 DIAGNOSIS — H5213 Myopia, bilateral: Secondary | ICD-10-CM | POA: Diagnosis not present

## 2023-05-01 LAB — COMPREHENSIVE METABOLIC PANEL
ALT: 30 U/L (ref 0–35)
AST: 55 U/L — ABNORMAL HIGH (ref 0–37)
Albumin: 4.4 g/dL (ref 3.5–5.2)
Alkaline Phosphatase: 49 U/L (ref 39–117)
BUN: 10 mg/dL (ref 6–23)
CO2: 27 mEq/L (ref 19–32)
Calcium: 9.1 mg/dL (ref 8.4–10.5)
Chloride: 102 mEq/L (ref 96–112)
Creatinine, Ser: 0.86 mg/dL (ref 0.40–1.20)
GFR: 86.98 mL/min (ref >60.00)
Glucose, Bld: 85 mg/dL (ref 70–99)
Potassium: 4.3 mEq/L (ref 3.5–5.1)
Sodium: 136 mEq/L (ref 135–145)
Total Bilirubin: 1.6 mg/dL — ABNORMAL HIGH (ref 0.2–1.2)
Total Protein: 7 g/dL (ref 6.0–8.3)

## 2023-05-01 LAB — HCV AB W REFLEX TO QUANT PCR: HCV Ab: NONREACTIVE

## 2023-05-01 LAB — LIPID PANEL
Cholesterol: 138 mg/dL (ref 0–200)
HDL: 49.4 mg/dL (ref >39.00)
LDL Cholesterol: 79 mg/dL (ref 0–99)
NonHDL: 88.93
Total CHOL/HDL Ratio: 3
Triglycerides: 51 mg/dL (ref 0.0–149.0)
VLDL: 10.2 mg/dL (ref 0.0–40.0)

## 2023-05-01 LAB — HCV INTERPRETATION

## 2023-05-01 NOTE — Progress Notes (Signed)
[facility]   HOME SLEEP TEST REPORT ( by Watch PAT)   STUDY DATE:  6-20 2024  Tracy Gallegos 37 year old female [birth year]    ORDERING CLINICIAN: Melvyn Novas, MD  REFERRING CLINICIAN:  Dr Tracy Gallegos   CLINICAL INFORMATION/HISTORY:  Tracy Gallegos is the spouse of a colleague in primary care and presented with night terrors, vivid dreams , nocturia,  parasomnia, insomnia and also snoring . NEW Sleep Patient in room #1 with her young daughter.  Patient snores and wakes up in the middle of the night gasping for air. Patient states her mother had sleep apnea but was heavy. She is married to Tracy Dinning, MD with three young children,  2 healthy daughters and her 26 year-old son Tracy Gallegos  who has special needs, is non ambulatory, used to have a feeding tube, with immune deficiency. 04/02/23 : "I have started to snore and wake up gasping for air, feeling as if I can't catch air , waking me up from sleep. Trazodone has helped to sleep, adderall had helped with fatigue".   Her previous sleep medicine physician Tracy Waynard Edwards, MD, dx sleep deprivation and depression- and had placed her on Seroquel in 2004, and on modafinil- repeat PSG in 2009 - "both shown a lot of REM pressure and not enough slow wave sleep. I was on Prozac at the time, too. Now on trazodone and adderall only 5 mg. Both are prn meds"       Epworth sleepiness score: 8 /24. FSS at 55/ 63 points.    BMI: 20.4 kg/m   Neck Circumference: 13.5"   FINDINGS:   Sleep Summary:   Total Recording Time (hours, min): 8 hours 1 minute      Total Sleep Time (hours, min): 6 hours 46 minutes                Percent REM (%):    24.6%                                    Respiratory Indices by AASM criteria:   Calculated pAHI (per hour): 1.5/h                           REM pAHI:   1.8/h                                              NREM pAHI:    1.4/h                          Positional AHI: The patient slept mostly  nonsupine associated with an AHI of 1.0 versus 53 minutes in supine position with an AHI of 4.5/h.  I would like to add that the RDI in supine sleep was 9.1/h though there is likely some snoring and additional airway resistance present.    Snoring reached a mean volume of 40 dB.  Chest wall electrode measurements this would mean mildest snoring.  Oxygen Saturation Statistics:        O2 Saturation Range (%):    Between the nadir at 85 and a maximum saturation of 99 with a mean saturation of 96                                   O2 Saturation (minutes) <89%:   0.1 minutes        Pulse Rate Statistics:   Pulse Mean (bpm): 79 bpm               Pulse Range:     Between 59 and 112 bpm            IMPRESSION:  This HST did not confirm the presence of sleep apnea but of a respiratory disturbance index that indicates that there has to be some component of snoring or airway restriction present.    RECOMMENDATION: I will follow this home sleep test with an attended sleep study to have more of an insight into the patient's frequent arousals, vivid dreams, high degree of fatigue. A CPAP would not be indicated based on these low apnea indices. She could be referred for a dental device to treat snoring .    INTERPRETING PHYSICIAN:   Melvyn Novas, MD  [facility].

## 2023-05-03 ENCOUNTER — Other Ambulatory Visit (HOSPITAL_COMMUNITY): Payer: Self-pay

## 2023-05-05 DIAGNOSIS — F519 Sleep disorder not due to a substance or known physiological condition, unspecified: Secondary | ICD-10-CM | POA: Insufficient documentation

## 2023-05-05 NOTE — Progress Notes (Signed)
Apnea was not present, even snoring was mild. This test result does not explain the parasomnia activity, vivid dreams and high fatigue score reported.  Needs to have attended sleep study to follow.

## 2023-05-05 NOTE — Procedures (Signed)
    [facility]   HOME SLEEP TEST REPORT ( by Watch PAT)   STUDY DATE:  6-20 2024  Tracy Gallegos 37 year old female 01/14/1986    ORDERING CLINICIAN: Becker Christopher, MD  REFERRING CLINICIAN:  Dr Rudd   CLINICAL INFORMATION/HISTORY:  [name] is the spouse of a colleague in primary care and presented with night terrors, vivid dreams , nocturia,  parasomnia, insomnia and also snoring . NEW Sleep Patient in room #1 with her young daughter.  Patient snores and wakes up in the middle of the night gasping for air. Patient states her mother had sleep apnea but was heavy. She is married to [name], MD with three young children,  2 healthy daughters and her 4 year-old son [name]  who has special needs, is non ambulatory, used to have a feeding tube, with immune deficiency. 04/02/23 : "I have started to snore and wake up gasping for air, feeling as if I can't catch air , waking me up from sleep. Trazodone has helped to sleep, adderall had helped with fatigue".   Her previous sleep medicine physician Dr Susan Esther, MD, dx sleep deprivation and depression- and had placed her on Seroquel in 2004, and on modafinil- repeat PSG in 2009 - "both shown a lot of REM pressure and not enough slow wave sleep. I was on Prozac at the time, too. Now on trazodone and adderall only 5 mg. Both are prn meds"       Epworth sleepiness score: 8 /24. FSS at 55/ 63 points.    BMI: 20.4 kg/m   Neck Circumference: 13.5"   FINDINGS:   Sleep Summary:   Total Recording Time (hours, min): 8 hours 1 minute      Total Sleep Time (hours, min): 6 hours 46 minutes                Percent REM (%):    24.6%                                    Respiratory Indices by AASM criteria:   Calculated pAHI (per hour): 1.5/h                           REM pAHI:   1.8/h                                              NREM pAHI:    1.4/h                          Positional AHI: The patient slept mostly  nonsupine associated with an AHI of 1.0 versus 53 minutes in supine position with an AHI of 4.5/h.  I would like to add that the RDI in supine sleep was 9.1/h though there is likely some snoring and additional airway resistance present.    Snoring reached a mean volume of 40 dB.  Chest wall electrode measurements this would mean mildest snoring.                                                  Oxygen Saturation Statistics:        O2 Saturation Range (%):    Between the nadir at 85 and a maximum saturation of 99 with a mean saturation of 96                                   O2 Saturation (minutes) <89%:   0.1 minutes        Pulse Rate Statistics:   Pulse Mean (bpm): 79 bpm               Pulse Range:     Between 59 and 112 bpm            IMPRESSION:  This HST did not confirm the presence of sleep apnea but of a respiratory disturbance index that indicates that there has to be some component of snoring or airway restriction present.    RECOMMENDATION: I will follow this home sleep test with an attended sleep study to have more of an insight into the patient's frequent arousals, vivid dreams, high degree of fatigue. A CPAP would not be indicated based on these low apnea indices. She could be referred for a dental device to treat snoring .    INTERPRETING PHYSICIAN:   Shawnae Leiva, MD  [facility].        

## 2023-05-07 ENCOUNTER — Encounter: Payer: Self-pay | Admitting: Neurology

## 2023-05-14 ENCOUNTER — Encounter: Payer: Self-pay | Admitting: Neurology

## 2023-05-14 ENCOUNTER — Other Ambulatory Visit: Payer: Self-pay | Admitting: Family Medicine

## 2023-05-14 ENCOUNTER — Ambulatory Visit: Payer: Commercial Managed Care - PPO | Admitting: Neurology

## 2023-05-14 ENCOUNTER — Other Ambulatory Visit (HOSPITAL_COMMUNITY): Payer: Self-pay

## 2023-05-14 ENCOUNTER — Other Ambulatory Visit (HOSPITAL_BASED_OUTPATIENT_CLINIC_OR_DEPARTMENT_OTHER): Payer: Self-pay

## 2023-05-14 VITALS — BP 104/70 | HR 83 | Ht 68.0 in | Wt 132.0 lb

## 2023-05-14 DIAGNOSIS — F331 Major depressive disorder, recurrent, moderate: Secondary | ICD-10-CM | POA: Diagnosis not present

## 2023-05-14 DIAGNOSIS — R5383 Other fatigue: Secondary | ICD-10-CM

## 2023-05-14 DIAGNOSIS — G43109 Migraine with aura, not intractable, without status migrainosus: Secondary | ICD-10-CM

## 2023-05-14 DIAGNOSIS — G479 Sleep disorder, unspecified: Secondary | ICD-10-CM

## 2023-05-14 MED ORDER — AMPHETAMINE-DEXTROAMPHETAMINE 10 MG PO TABS
10.0000 mg | ORAL_TABLET | Freq: Every day | ORAL | 0 refills | Status: DC
Start: 2023-05-14 — End: 2023-08-29
  Filled 2023-05-14: qty 30, 30d supply, fill #0

## 2023-05-14 MED ORDER — VERAPAMIL HCL ER 120 MG PO TBCR
120.0000 mg | EXTENDED_RELEASE_TABLET | Freq: Every day | ORAL | 1 refills | Status: DC
  Filled 2023-05-14: qty 30, 30d supply, fill #0

## 2023-05-14 MED ORDER — SPIRONOLACTONE 100 MG PO TABS
100.0000 mg | ORAL_TABLET | Freq: Every day | ORAL | 1 refills | Status: DC
  Filled 2023-05-14: qty 90, 90d supply, fill #0
  Filled 2023-11-19: qty 90, 90d supply, fill #1

## 2023-05-14 NOTE — Patient Instructions (Signed)
Migraine Headache A migraine headache is a very strong throbbing pain on one or both sides of your head. This type of headache can also cause other symptoms. It can last from 4 hours to 3 days. Talk with your doctor about what things may bring on (trigger) this condition. What are the causes? The exact cause of a migraine is not known. This condition may be brought on or caused by: Smoking. Medicines, such as: Medicine used to treat chest pain (nitroglycerin). Birth control pills. Estrogen. Some blood pressure medicines. Certain substances in some foods or drinks. Foods and drinks, such as: Cheese. Chocolate. Alcohol. Caffeine. Doing physical activity that is very hard. Other things that may trigger a migraine headache include: Periods. Pregnancy. Hunger. Stress. Getting too much or too little sleep. Weather changes. Feeling tired (fatigue). What increases the risk? Being 25-55 years old. Being female. Having a family history of migraine headaches. Being Caucasian. Having a mental health condition, such as being sad (depressed) or feeling worried or nervous (anxious). Being very overweight (obese). What are the signs or symptoms? A throbbing pain. This pain may: Happen in any area of the head, such as on one or both sides. Make it hard to do daily activities. Get worse with physical activity. Get worse around bright lights, loud noises, or smells. Other symptoms may include: Feeling like you may vomit (nauseous). Vomiting. Dizziness. Before a migraine headache starts, you may get warning signs (an aura). An aura may include: Seeing flashing lights or having blind spots. Seeing bright spots, halos, or zigzag lines. Having tunnel vision or blurred vision. Having numbness or a tingling feeling. Having trouble talking. Having weak muscles. After a migraine ends, you may have symptoms. These may include: Tiredness. Trouble thinking (concentrating). How is this  treated? Taking medicines that: Relieve pain. Relieve the feeling like you may vomit. Prevent migraine headaches. Treatment may also include: Acupuncture. Lifestyle changes like avoiding foods that bring on migraine headaches. Learning ways to control your body functions (biofeedback). Therapy to help you know and deal with negative thoughts (cognitive behavioral therapy). Follow these instructions at home: Medicines Take over-the-counter and prescription medicines only as told by your doctor. If told, take steps to prevent problems with pooping (constipation). You may need to: Drink enough fluid to keep your pee (urine) pale yellow. Take medicines. You will be told what medicines to take. Eat foods that are high in fiber. These include beans, whole grains, and fresh fruits and vegetables. Limit foods that are high in fat and sugar. These include fried or sweet foods. Ask your doctor if you should avoid driving or using machines while you are taking your medicine. Lifestyle  Do not drink alcohol. Do not smoke or use any products that contain nicotine or tobacco. If you need help quitting, ask your doctor. Get 7-9 hours of sleep each night, or the amount recommended by your doctor. Find ways to deal with stress, such as meditation, deep breathing, or yoga. Try to exercise often. This can help lessen how bad and how often your migraines happen. General instructions Keep a journal to find out what may bring on your migraine headaches. This can help you avoid those things. For example, write down: What you eat and drink. How much sleep you get. Any change to your medicines or diet. If you have a migraine headache: Avoid things that make your symptoms worse, such as bright lights. Lie down in a dark, quiet room. Do not drive or use machinery. Ask your   doctor what activities are safe for you. Where to find more information Coalition for Headache and Migraine Patients (CHAMP):  headachemigraine.org American Migraine Foundation: americanmigrainefoundation.org National Headache Foundation: headaches.org Contact a doctor if: You get a migraine headache that is different or worse than others you have had. You have more than 15 days of headaches in one month. Get help right away if: Your migraine headache gets very bad. Your migraine headache lasts more than 72 hours. You have a fever or stiff neck. You have trouble seeing. Your muscles feel weak or like you cannot control them. You lose your balance a lot. You have trouble walking. You faint. You have a seizure. This information is not intended to replace advice given to you by your health care provider. Make sure you discuss any questions you have with your health care provider. Document Revised: 06/24/2022 Document Reviewed: 06/24/2022 Elsevier Patient Education  2024 Elsevier Inc.  

## 2023-05-14 NOTE — Progress Notes (Signed)
Provider:  Melvyn Novas, MD  Primary Care Physician:  Loyola Mast, MD [address] [place] [address]     Referring Provider: Gwyneth Sprout, Md [address] [place],  [place] [address]          Chief Complaint according to patient   Patient presents with:     New Patient (Initial Visit)     Pt is here for a complex migraine. Pt reports that on 04/24/23 she started feeling very dizzy after going to the gym. Quickly after that there was a left-sided headache. Pt states she then felt like she had difficulty with her gait feeling unsteady and difficulty getting her words out. She went to the ER. She had an MRI which was normal. Pt states has not had any more episodes since then.       HISTORY OF PRESENT ILLNESS:  Tracy Gallegos is a 37 y.o. female patient who is here for revisit 05/14/2023 follow -up for a recent Visit  with the ED .    Chief concern according to patient :  Patient is here for a suspected complex migraine. on 04/24/23 she started feeling very dizzy after being in the gym for about 60 minutes of weight lifting , got dizy while putting the weights away- and felt woozy- just on the way to the water fountain-. Quickly after that there was a "massive" left-sided headache.  Pt states she then felt like she had difficulty with her gait feeling unsteady and couldn't call her daughter by name, difficulty getting her words out. She noted a "firework in the right eye" then the vision blacked out in the right eye-   She went to her car, and had trouble buckling the kids in.   She looked at her phone and couldn't sort the numbers, not recognizing the people's name - she called her husband ( Dr Vernona Rieger) and he noted her speech was slurred -  he asked her to stop and called EMS to her location.  The EMT found her incoherent , she went to the ED for evaluation. She had an MRI which was normal.  Pt states has not had any more episodes since  then.       Review of Systems: Out of a complete 14 system review, the patient complains of only the following symptoms, and all other reviewed systems are negative.:   Recent sudden ,   How likely are you to doze in the following situations: 0 = not likely, 1 = slight chance, 2 = moderate chance, 3 = high chance   Sitting and Reading? Watching Television? Sitting inactive in a public place (theater or meeting)? As a passenger in a car for an hour without a break? Lying down in the afternoon when circumstances permit? Sitting and talking to someone? Sitting quietly after lunch without alcohol? In a car, while stopped for a few minutes in traffic?   Social History   Socioeconomic History   Marital status: Married    Spouse name: Brad   Number of children: 3   Years of education: Not on file   Highest education level: Not on file  Occupational History   Not on file  Tobacco Use   Smoking status: Never   Smokeless tobacco: Never  Vaping Use   Vaping Use: Never used  Substance and Sexual Activity   Alcohol use: No   Drug use: No   Sexual activity: Yes  Birth control/protection: I.U.D., None  Other Topics Concern   Not on file  Social History Narrative   Special needs child   Social Determinants of Health   Financial Resource Strain: Not on file  Food Insecurity: Not on file  Transportation Needs: Not on file  Physical Activity: Not on file  Stress: Not on file  Social Connections: Not on file    Family History  Problem Relation Age of Onset   Obesity Mother    Depression Mother    Alcohol abuse Mother    Heart disease Father    Early death Father    Obesity Brother    Alcohol abuse Maternal Uncle    Depression Maternal Uncle    Hypertension Maternal Grandmother    Depression Maternal Grandmother    Alcohol abuse Maternal Grandmother    Alzheimer's disease Maternal Grandmother    Depression Maternal Grandfather    Stroke Maternal Grandfather    Heart  disease Maternal Grandfather    Suicidality Maternal Grandfather    Suicidality Paternal Grandmother    Depression Paternal Grandmother    Heart disease Paternal Grandfather    Early death Paternal Grandfather     Past Medical History:  Diagnosis Date   Depression    Endometriosis    History of gestational diabetes 01/23/2018   Ovarian cyst    Bilateral    Past Surgical History:  Procedure Laterality Date   OVARIAN CYST SURGERY     WISDOM TOOTH EXTRACTION       Current Outpatient Medications on File Prior to Visit  Medication Sig Dispense Refill   amphetamine-dextroamphetamine (ADDERALL) 10 MG tablet Take 1 tablet (10 mg total) by mouth daily with breakfast. 30 tablet 0   calcium carbonate (TUMS - DOSED IN MG ELEMENTAL CALCIUM) 500 MG chewable tablet Chew 1 tablet by mouth daily.     cetirizine (ZYRTEC) 10 MG tablet Take 10 mg by mouth daily. Daily as needed     clindamycin (CLINDAGEL) 1 % gel Apply topically 2 (two) times daily. 30 g 0   dicyclomine (BENTYL) 20 MG tablet Take 0.5 tablets (10 mg total) by mouth 3 (three) times daily for abdominal pain. 45 tablet 5   escitalopram (LEXAPRO) 10 MG tablet Take 1.5 tablets (15 mg total) by mouth daily. 135 tablet 0   fexofenadine (ALLEGRA ALLERGY) 180 MG tablet Take 1 tablet (180 mg total) by mouth daily. 100 tablet 1   levonorgestrel (MIRENA) 20 MCG/DAY IUD 1 each by Intrauterine route once.     loratadine (CLARITIN) 10 MG tablet Take 10 mg by mouth daily as needed for allergies.     melatonin 3 MG TABS tablet Take 3 mg by mouth at bedtime.     metoprolol succinate (TOPROL-XL) 25 MG 24 hr tablet Take 1 tablet (25 mg total) by mouth daily. 90 tablet 3   mirabegron ER (MYRBETRIQ) 25 MG TB24 tablet Take 1 tablet (25 mg total) by mouth daily. 30 tablet 3   mometasone (ELOCON) 0.1 % cream Apply topically daily as needed for itching. 15 g 5   Multiple Vitamin (MULTIVITAMIN) tablet Take 1 tablet by mouth daily.     naproxen (NAPROSYN) 500  MG tablet Take 1 tablet (500 mg total) by mouth as needed. Takes as needed only 30 tablet 2   prednisoLONE acetate (PRED FORTE) 1 % ophthalmic suspension Place 4 drops into the right ear as needed for itching. 5 mL 5   traZODone (DESYREL) 50 MG tablet Take 1/2 tablet (25 mg  total) by mouth at bedtime as needed for sleep. 30 tablet 3   tretinoin (RETIN-A) 0.01 % gel Apply topically at bedtime. 15 g 0   spironolactone (ALDACTONE) 100 MG tablet Take 0.5 tablets (50 mg total) by mouth daily for 7 days, THEN 1 tablet (100 mg total) daily. 30 tablet 2   No current facility-administered medications on file prior to visit.    Allergies  Allergen Reactions   Benadryl [Diphenhydramine] Other (See Comments)    Cramping muscles.     DIAGNOSTIC DATA (LABS, IMAGING, TESTING) - I reviewed patient records, labs, notes, testing and imaging myself where available.  Lab Results  Component Value Date   WBC 12.3 (H) 04/24/2023   HGB 13.3 04/24/2023   HCT 39.0 04/24/2023   MCV 89.6 04/24/2023   PLT 228 04/24/2023      Component Value Date/Time   NA 136 04/30/2023 1637   K 4.3 04/30/2023 1637   CL 102 04/30/2023 1637   CO2 27 04/30/2023 1637   GLUCOSE 85 04/30/2023 1637   BUN 10 04/30/2023 1637   CREATININE 0.86 04/30/2023 1637   CALCIUM 9.1 04/30/2023 1637   PROT 7.0 04/30/2023 1637   ALBUMIN 4.4 04/30/2023 1637   AST 55 (H) 04/30/2023 1637   ALT 30 04/30/2023 1637   ALKPHOS 49 04/30/2023 1637   BILITOT 1.6 (H) 04/30/2023 1637   GFRNONAA >60 04/24/2023 1437   Lab Results  Component Value Date   CHOL 138 04/30/2023   HDL 49.40 04/30/2023   LDLCALC 79 04/30/2023   TRIG 51.0 04/30/2023   CHOLHDL 3 04/30/2023   Lab Results  Component Value Date   HGBA1C 5.4 08/02/2021   No results found for: "VITAMINB12" Lab Results  Component Value Date   TSH 1.99 06/09/2019    PHYSICAL EXAM:  Today's Vitals   05/14/23 1410  BP: 104/70  Pulse: 83  Weight: 132 lb (59.9 kg)  Height: 5\' 8"   (1.727 m)   Body mass index is 20.07 kg/m.   Wt Readings from Last 3 Encounters:  05/14/23 132 lb (59.9 kg)  04/30/23 133 lb 9.6 oz (60.6 kg)  04/24/23 140 lb (63.5 kg)     Ht Readings from Last 3 Encounters:  05/14/23 5\' 8"  (1.727 m)  04/30/23 5\' 8"  (1.727 m)  04/24/23 5\' 8"  (1.727 m)      General: The patient is awake, alert and appears not in acute distress. The patient is well groomed. Head: Normocephalic, atraumatic. Neck is supple. Mallampati 2,  neck circumference:13.5  inches .  Nasal airflow  patent.  Retrognathia is not seen.  Dental status: biological   Cardiovascular:  Regular rate and cardiac rhythm by pulse,  without distended neck veins. Respiratory: Lungs are clear to auscultation.  Skin:  Without evidence of ankle edema, or rash. Trunk: The patient's posture is erect.   NEUROLOGIC EXAM: The patient is awake and alert, oriented to place and time.   Memory subjective described as intact.  Attention span & concentration ability appears normal.  Speech is fluent,  without  dysarthria, dysphonia or aphasia.  Mood and affect are appropriate.   Cranial nerves: no loss of smell or taste reported  Pupils are equal and briskly reactive to light. Funduscopic exam deferred.  Extraocular movements in vertical and horizontal planes were intact and without nystagmus. No Diplopia. Visual fields by finger perimetry are intact. Hearing was intact to soft voice and finger rubbing.    Facial sensation intact to fine touch.  Facial motor  strength is symmetric and tongue and uvula move midline.  Neck ROM : rotation, tilt and flexion extension were normal for age and shoulder shrug was symmetrical.    Motor exam:  Symmetric bulk, tone and ROM.   Normal tone without cog wheeling, symmetric grip strength .   Sensory:  Fine touch, pinprick and vibration were tested  and  normal.  Proprioception tested in the upper extremities was normal.   Coordination: Rapid alternating  movements in the fingers/hands were of normal speed.  The Finger-to-nose maneuver was intact without evidence of ataxia, dysmetria or tremor.   Gait and station: Patient could rise unassisted from a seated position, walked without assistive device.  Stance is of normal width/ base .  Toe and heel walk were deferred.  Deep tendon reflexes: in the  upper and lower extremities are symmetric , brisk.  Babinski response was downgoing, very ticklish.       ASSESSMENT AND PLAN 37 y.o. year old female here with:   Complicated migraines;     1) on 04-24-2023 ,sudden onset of left sided headaches, aphasia and dysarthria, and right vision loss.  Apraxia,  See initial introduction into today's visit.  Complicated migraine , no stroke. MRI shows all patent vessels no edema.   2) "tetanus like muscle spasm"  couldn't open the hand, felt all muscles were rigid, arm and hands, face too.   3) labs in ED were positive for high WBC, Neutrophils.  Potassium was only 3.4 while on spironolactone. Normal sodium, but low Calcium.  Co2 low - hyperventilation result  (!).  Migraine : since 2021. Never had this form before. Stress related onset.   I suggested a calcium channel blocker , not a beta blocker ( not needing reduction in BP or heart rate at this time). She had been on Toprol but stopped taking it sometimes in 2023.  It will help PVCs and can help with complicated migraines.  Mrs Breceda will discuss this with her husband.     I plan to follow up either personally or through our NP within 3-4 months. This patient is also expecting a sleep study later this summer at [facility] sleep/   I would like to thank Rudd, Bertram Millard, MD and Gwyneth Sprout, Md [address] [place],  [place] [address] for allowing me to meet with and to take care of this pleasant patient.     After spending a total time of  35  minutes face to face and additional time for physical and neurologic examination, review  of laboratory studies,  personal review of imaging studies, reports and results of other testing and review of referral information / records as far as provided in visit,   Electronically signed by: Melvyn Novas, MD 05/14/2023 2:34 PM  [facility] Sleep Board certified by The [employer] of Sleep Medicine and Diplomate of the [employer] of Sleep Medicine. Board certified In Neurology through the ABPN, Fellow of the [employer] of Neu[employer].

## 2023-05-16 ENCOUNTER — Other Ambulatory Visit (HOSPITAL_BASED_OUTPATIENT_CLINIC_OR_DEPARTMENT_OTHER): Payer: Self-pay

## 2023-05-29 ENCOUNTER — Telehealth: Payer: Commercial Managed Care - PPO | Admitting: Physician Assistant

## 2023-05-29 ENCOUNTER — Other Ambulatory Visit: Payer: Self-pay | Admitting: Family Medicine

## 2023-05-29 DIAGNOSIS — B9689 Other specified bacterial agents as the cause of diseases classified elsewhere: Secondary | ICD-10-CM | POA: Diagnosis not present

## 2023-05-29 DIAGNOSIS — F418 Other specified anxiety disorders: Secondary | ICD-10-CM

## 2023-05-29 DIAGNOSIS — J019 Acute sinusitis, unspecified: Secondary | ICD-10-CM

## 2023-05-29 MED ORDER — AMOXICILLIN-POT CLAVULANATE 875-125 MG PO TABS
1.0000 | ORAL_TABLET | Freq: Two times a day (BID) | ORAL | 0 refills | Status: DC
Start: 2023-05-29 — End: 2023-08-29

## 2023-05-29 NOTE — Progress Notes (Signed)
I have spent 5 minutes in review of e-visit questionnaire, review and updating patient chart, medical decision making and response to patient.   William Cody Martin, PA-C    

## 2023-05-29 NOTE — Progress Notes (Signed)

## 2023-05-30 ENCOUNTER — Other Ambulatory Visit: Payer: Self-pay

## 2023-05-30 MED ORDER — ESCITALOPRAM OXALATE 10 MG PO TABS
15.0000 mg | ORAL_TABLET | Freq: Every day | ORAL | 0 refills | Status: DC
Start: 2023-05-30 — End: 2023-08-29
  Filled 2023-05-30: qty 135, 90d supply, fill #0

## 2023-06-11 ENCOUNTER — Telehealth: Payer: Self-pay | Admitting: Neurology

## 2023-06-11 ENCOUNTER — Encounter (INDEPENDENT_AMBULATORY_CARE_PROVIDER_SITE_OTHER): Payer: Self-pay

## 2023-06-11 NOTE — Telephone Encounter (Signed)
[employer]- [facility] [employer] no auth req.  Sent [employer].

## 2023-06-12 ENCOUNTER — Other Ambulatory Visit (HOSPITAL_BASED_OUTPATIENT_CLINIC_OR_DEPARTMENT_OTHER): Payer: Self-pay

## 2023-06-12 DIAGNOSIS — L658 Other specified nonscarring hair loss: Secondary | ICD-10-CM | POA: Insufficient documentation

## 2023-06-12 DIAGNOSIS — L219 Seborrheic dermatitis, unspecified: Secondary | ICD-10-CM | POA: Diagnosis not present

## 2023-06-12 DIAGNOSIS — L649 Androgenic alopecia, unspecified: Secondary | ICD-10-CM | POA: Insufficient documentation

## 2023-06-12 MED ORDER — KETOCONAZOLE 2 % EX SHAM
MEDICATED_SHAMPOO | CUTANEOUS | 11 refills | Status: AC
  Filled 2023-06-12: qty 120, 28d supply, fill #0

## 2023-06-12 MED ORDER — FINASTERIDE 5 MG PO TABS
ORAL_TABLET | Freq: Every day | ORAL | 3 refills | Status: DC
  Filled 2023-06-12 – 2023-06-28 (×2): qty 45, 90d supply, fill #0

## 2023-06-24 ENCOUNTER — Other Ambulatory Visit (HOSPITAL_BASED_OUTPATIENT_CLINIC_OR_DEPARTMENT_OTHER): Payer: Self-pay

## 2023-06-28 ENCOUNTER — Other Ambulatory Visit (HOSPITAL_BASED_OUTPATIENT_CLINIC_OR_DEPARTMENT_OTHER): Payer: Self-pay

## 2023-07-04 ENCOUNTER — Ambulatory Visit: Payer: Commercial Managed Care - PPO | Admitting: Family Medicine

## 2023-08-29 ENCOUNTER — Other Ambulatory Visit (HOSPITAL_BASED_OUTPATIENT_CLINIC_OR_DEPARTMENT_OTHER): Payer: Self-pay

## 2023-08-29 ENCOUNTER — Ambulatory Visit: Payer: Commercial Managed Care - PPO | Admitting: Family Medicine

## 2023-08-29 VITALS — BP 106/64 | HR 94 | Temp 98.2°F | Ht 68.0 in | Wt 137.0 lb

## 2023-08-29 DIAGNOSIS — L219 Seborrheic dermatitis, unspecified: Secondary | ICD-10-CM

## 2023-08-29 DIAGNOSIS — R5383 Other fatigue: Secondary | ICD-10-CM

## 2023-08-29 DIAGNOSIS — G479 Sleep disorder, unspecified: Secondary | ICD-10-CM

## 2023-08-29 DIAGNOSIS — F418 Other specified anxiety disorders: Secondary | ICD-10-CM

## 2023-08-29 MED ORDER — AMPHETAMINE-DEXTROAMPHETAMINE 10 MG PO TABS
10.0000 mg | ORAL_TABLET | Freq: Every day | ORAL | 0 refills | Status: DC
Start: 2023-08-29 — End: 2024-01-21
  Filled 2023-08-29: qty 30, 30d supply, fill #0

## 2023-08-29 MED ORDER — ESCITALOPRAM OXALATE 20 MG PO TABS
20.0000 mg | ORAL_TABLET | Freq: Every day | ORAL | 3 refills | Status: DC
  Filled 2023-08-29: qty 90, 90d supply, fill #0
  Filled 2023-12-25: qty 90, 90d supply, fill #1
  Filled 2024-03-31: qty 90, 90d supply, fill #2
  Filled 2024-07-02: qty 90, 90d supply, fill #3

## 2023-08-29 NOTE — Assessment & Plan Note (Signed)
As above.

## 2023-08-29 NOTE — Progress Notes (Signed)
[facility] [address] [place] [address] Dept: [phone] Dept Fax: [phone]  Chronic Care Office Visit  Subjective:    Patient ID: Tracy Gallegos, female    DOB: [birth year], 37 y.o..   MRN: [id number]  Chief Complaint  Patient presents with   Follow-up    2 month f/u.  C/o having a lump on back of neck   History of Present Illness:  Patient is in today for reassessment of chronic medical issues.  Ms. Bleile has a history of depression with anxiety. She is managed on escitalopram 15 mg daily. She notes she is feeling very overwhelmed much of the time. She feels like she is being a poor mother and a poor spouse. She notes some difficulties this is causing for their marriage. Her family life remains stressful. She notes her husband has pointed out that she is prioritizing their children's needs over his own and she feels that may be the case. She is wondering about making changes to her medications. She was previously on bupropion, but this seemed to increase her irritability. she was also previously on lamotrigine, under the care of a psychiatrist. She found this contributed to drowsiness. She remains on Adderall for fatigue/hypersomnolence issues, but she wants to be cautious about the dose of this, as it interferes with her sleep at night. She is not currently working with a psychiatrist or other prescribing behavioral health provider.   Ms. Trifiletti has seen neurology since her last visit with me. She was started on verapamil for concerns regarding a complicated migraine headache. She is not convinced the verapamil is doing anything. She has not had any recurrence of the episode she experienced back in the early summer. She is thinking about stopping her verapamil.  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 she notes dermatology is working with her on different treatment approaches. She is using topical  minoxidil. However,t he product has propylene glycol in it, which she seems to be allergic to. She has noted a knot over her right upper posterior neck. She also notes the dermatoligst tried her on oral finasteride, but she found the side effects were not tolerable.  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   Elevated AST (SGOT) 05/01/2023   Ocular migraine 04/30/2023   Nocturia 04/02/2023   Night terrors 04/02/2023   Abnormal dreams 04/02/2023   Snoring 04/02/2023   History of colonic polyps 01/21/2023   Scarlet fever 01/21/2023   Irritable bowel syndrome (IBS) 10/16/2022   IUD (intrauterine device) in place 10/16/2022   Acne 10/16/2022   Hypersomnolence disorder 10/16/2022   Seborrhea 10/16/2021   Hair loss 10/16/2021   Chronic abdominal pain 08/02/2021   Fatigue 06/07/2019   Depression with anxiety 06/30/2018   History of gestational diabetes 06/30/2018   Sleep disorder, unspecified 06/30/2018   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   Anti-D antibodies present during pregnancy 01-Oct-2017   Primary female infertility 09/15/2014   Fibromyalgia 08/03/2013   Past Surgical History:  Procedure Laterality Date   OVARIAN CYST SURGERY     WISDOM TOOTH EXTRACTION     Family History  Problem Relation Age of Onset   Obesity Mother    Depression Mother    Alcohol abuse Mother    Heart disease Father    Early death  Father    Obesity Brother    Alcohol abuse Maternal Uncle    Depression Maternal Uncle    Hypertension Maternal Grandmother    Depression Maternal Grandmother    Alcohol abuse Maternal Grandmother    Alzheimer's disease Maternal Grandmother    Depression Maternal Grandfather    Stroke Maternal Grandfather    Heart disease Maternal Grandfather    Suicidality Maternal Grandfather     Suicidality Paternal Grandmother    Depression Paternal Grandmother    Heart disease Paternal Grandfather    Early death Paternal Grandfather    Outpatient Medications Prior to Visit  Medication Sig Dispense Refill   calcium carbonate (TUMS - DOSED IN MG ELEMENTAL CALCIUM) 500 MG chewable tablet Chew 1 tablet by mouth daily.     cetirizine (ZYRTEC) 10 MG tablet Take 10 mg by mouth daily. Daily as needed     clindamycin (CLINDAGEL) 1 % gel Apply topically 2 (two) times daily. 30 g 0   dicyclomine (BENTYL) 20 MG tablet Take 0.5 tablets (10 mg total) by mouth 3 (three) times daily for abdominal pain. 45 tablet 5   fexofenadine (ALLEGRA ALLERGY) 180 MG tablet Take 1 tablet (180 mg total) by mouth daily. 100 tablet 1   ketoconazole (NIZORAL) 2 % shampoo Apply to scalp and shampoo as usual. Repeat weekly 120 mL 11   levonorgestrel (MIRENA) 20 MCG/DAY IUD 1 each by Intrauterine route once.     melatonin 3 MG TABS tablet Take 3 mg by mouth at bedtime.     mirabegron ER (MYRBETRIQ) 25 MG TB24 tablet Take 1 tablet (25 mg total) by mouth daily. 30 tablet 3   mometasone (ELOCON) 0.1 % cream Apply topically daily as needed for itching. 15 g 5   Multiple Vitamin (MULTIVITAMIN) tablet Take 1 tablet by mouth daily.     naproxen (NAPROSYN) 500 MG tablet Take 1 tablet (500 mg total) by mouth as needed. Takes as needed only 30 tablet 2   prednisoLONE acetate (PRED FORTE) 1 % ophthalmic suspension Place 4 drops into the right ear as needed for itching. 5 mL 5   traZODone (DESYREL) 50 MG tablet Take 1/2 tablet (25 mg total) by mouth at bedtime as needed for sleep. 30 tablet 3   tretinoin (RETIN-A) 0.01 % gel Apply topically at bedtime. 15 g 0   verapamil (CALAN-SR) 120 MG CR tablet Take 1 tablet (120 mg total) by mouth at bedtime. 30 tablet 1   amoxicillin-clavulanate (AUGMENTIN) 875-125 MG tablet Take 1 tablet by mouth 2 (two) times daily. 14 tablet 0   amphetamine-dextroamphetamine (ADDERALL) 10 MG tablet Take  1 tablet (10 mg total) by mouth daily with breakfast. 30 tablet 0   escitalopram (LEXAPRO) 10 MG tablet Take 1.5 tablets (15 mg total) by mouth daily. 135 tablet 0   finasteride (PROSCAR) 5 MG tablet Take one-half tablet (2.5 mg total) by mouth daily. 45 tablet 3   spironolactone (ALDACTONE) 100 MG tablet Take 1 tablet (100 mg total) by mouth daily. 90 tablet 1   No facility-administered medications prior to visit.   Allergies  Allergen Reactions   Benadryl [Diphenhydramine] Other (See Comments)    Cramping muscles.   Objective:   Today's Vitals   08/29/23 0825  BP: 106/64  Pulse: 94  Temp: 98.2 F (36.8 C)  TempSrc: Temporal  SpO2: 99%  Weight: 137 lb (62.1 kg)  Height: 5\' 8"  (1.727 m)   Body mass index is 20.83 kg/m.   General: Well developed, well  nourished. No acute distress. HEENT: Mild to moderate dandruff ntoed. Neck: Supple. The "knot" that the patient found appears to be a lymph node. This feels firm, but is of normal size. Psych: Alert and oriented. Mild depressed mood and affect. Some minild tearfulness present.  Health Maintenance Due  Topic Date Due   HPV VACCINES (2 - 3-dose SCDM series) 08/02/2020   INFLUENZA VACCINE  06/12/2023     Assessment & Plan:   Problem List Items Addressed This Visit       Musculoskeletal and Integument   Seborrhea - Primary    The knot on the neck appears to be a reactive lymph node, likely due to scalp inflammation due to seborrhea and/or the topical minoxidil. I recommend periodic heat and monitoring this.        Other   Depression with anxiety    Depression and anxiety are increased currently. I will increase her escitalopram to 20 daily. 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 I urged her to re-establish with a prescribing behavioral health provider/psychiatrist so we can have their guidance in this adjuvant therapies.      Relevant Medications    escitalopram (LEXAPRO) 20 MG tablet   Fatigue    Continue Adderall 5-10 mg daily as needed for hypersomnolence.      Relevant Medications   amphetamine-dextroamphetamine (ADDERALL) 10 MG tablet   Sleep disorder, unspecified    As above.      Relevant Medications   amphetamine-dextroamphetamine (ADDERALL) 10 MG tablet    Return in about 6 weeks (around 10/10/2023) for Reassessment.   Loyola Mast, MD

## 2023-08-29 NOTE — Assessment & Plan Note (Signed)
The knot on the neck appears to be a reactive lymph node, likely due to scalp inflammation due to seborrhea and/or the topical minoxidil. I recommend periodic heat and monitoring this.

## 2023-08-29 NOTE — Assessment & Plan Note (Signed)
Depression and anxiety are increased currently. I will increase her escitalopram to 20 daily. 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 I urged her to re-establish with a prescribing behavioral health provider/psychiatrist so we can have their guidance in this adjuvant therapies.

## 2023-08-29 NOTE — Assessment & Plan Note (Signed)
Continue Adderall 5-10 mg daily as needed for hypersomnolence.

## 2023-09-02 ENCOUNTER — Encounter: Payer: Self-pay | Admitting: Neurology

## 2023-09-02 NOTE — Telephone Encounter (Signed)
Patient called because she didn't know if she should reschedule her sleep study since she is sick and coughing bad. She was very upset because she just started getting sick and they have been planning to do this study for a long time down and due to her Husband work schedule he can only give a two months notice.   She is r/s from 11/09/23 at 8 pm.

## 2023-09-17 ENCOUNTER — Ambulatory Visit: Payer: Commercial Managed Care - PPO | Admitting: Neurology

## 2023-09-23 ENCOUNTER — Ambulatory Visit (INDEPENDENT_AMBULATORY_CARE_PROVIDER_SITE_OTHER): Payer: Commercial Managed Care - PPO | Admitting: Neurology

## 2023-09-23 DIAGNOSIS — R351 Nocturia: Secondary | ICD-10-CM

## 2023-09-23 DIAGNOSIS — F518 Other sleep disorders not due to a substance or known physiological condition: Secondary | ICD-10-CM

## 2023-09-23 DIAGNOSIS — F514 Sleep terrors [night terrors]: Secondary | ICD-10-CM

## 2023-09-23 DIAGNOSIS — R0683 Snoring: Secondary | ICD-10-CM | POA: Diagnosis not present

## 2023-09-23 DIAGNOSIS — F519 Sleep disorder not due to a substance or known physiological condition, unspecified: Secondary | ICD-10-CM

## 2023-09-27 NOTE — Procedures (Signed)
[facility] POLYSOMNOGRAPHY (Parasomnia montage)  INTERPRETATION REPORT   STUDY DATE:  09/23/2023     PATIENT NAME:  Tracy Gallegos         DATE OF BIRTH:  [birth year]  PATIENT ID:  [id number]    TYPE OF STUDY:  PSG  READING PHYSICIAN: Melvyn Novas, MD REFERRED BY:  SCORING TECHNICIAN: Margaretann Loveless, RPSGT   HISTORY:  This 37 year-old Female patient ( Tracy Gallegos) presented with night terrors, vivid dreams , parasomnia, insomnia and also snoring. TIA vs seizure versus complicated migraine were discussed in her latest (05-14-2023) visit with me. NEW Sleep Patient 04-30-2023 in room #1 with her young daughter.  Patient snores and wakes up in the middle of the night- gasping for air. Patient stated her mother had sleep apnea but was heavy. She is married to Tracy Dinning, MD with three young children, 2 healthy daughters and her 41-year-old son Tracy Gallegos, who has special needs, is non-ambulatory, used to have a feeding tube, and struggles with immune deficiency.  : "I have started to snore and wake up gasping for air, feeling as if I can't catch air , waking me up from sleep. Trazodone has helped to sleep; Adderall had helped with fatigue".   Her previous sleep medicine physician Dr Tracy Edwards, MD, dx her with sleep deprivation and depression- and had placed her on Seroquel in 2004, and on Modafinil- repeated a PSG in 2009 - "both shown a lot of REM pressure and not enough slow wave sleep. I was on Prozac at the time, too.  Now on prn Trazodone and prnAdderall only 5 mg. ADDITIONAL INFORMATION:  The Epworth Sleepiness Scale endorsed at  8 /24 points (scores above or equal to 10 are suggestive of hypersomnolence). FSS endorsed at 55 /63 points.  Height: 68 in Weight: 132 lbs (BMI 20) Neck Size: 14 in  MEDICATIONS: Adderall, Tums, Zyrtec, Clindagel, Bentyl, Lexapro, Allegra, Mirena, Claritin, Melatonin, Toprol - XL, Myrbetriq, Elocon, Multivitamin,  Naproxen, Prednisolone, Trazodone, Retin-A, Aldactone   TECHNICAL DESCRIPTION: A registered sleep technologist (RPSGT) was in attendance for the duration of the recording.  Data collection, scoring, video monitoring, and reporting were performed in compliance with the AASM Manual for the Scoring of Sleep and Associated Events; (Hypopnea is scored based on the criteria listed in Section VIII D. 1b in the AASM Manual V2.6 using a 4% oxygen desaturation rule or Hypopnea is scored based on the criteria listed in Section VIII D. 1a in the AASM Manual V2.6 using 3% oxygen desaturation and /or arousal rule).   SLEEP CONTINUITY AND SLEEP ARCHITECTURE:  Lights-out was at 22:15: and lights-on at  05:22:, with  7.1 hours of recording time. Total sleep time (TST) was 74.0 minutes with a decreased sleep efficiency at 17.3%. There was 0.0% REM sleep.  Total sleep time was reduced at 74.0 minutes.  Sleep efficiency was decreased at 17.3%.    BODY POSITION:  TST was recorded in 0.0% supine; right lateral for 06 minutes (8%), left for 68 minutes (92%), and prone 00 minutes (0%).  Total REM sleep time was 00 minutes (0% REM sleep). N3 sleep was seen en bloc for50 minutes at the beginning of persistent sleep. Sleep position was left lateral, N3 sleep was accompanied by mild snoring. There was a myoclonic head jerk at 2:53 AM.   Sleep latency was increased at 147.0 minutes.   Of the total sleep time, the percentage of stage N1 sleep was 10.1%, stage N2  sleep was 22%, stage N3 sleep was 67.6%, and REM sleep was 0.0%. WASO time was 207 minutes !   There were 5 awakenings (i.e. transitions to Stage W from any sleep stage), and 17 total stage transitions.   RESPIRATORY MONITORING:  Based on AASM criteria (using a 3% oxygen desaturation and /or arousal rule for scoring hypopneas), there were 0 apneas (0 obstructive; 0 central; 0 mixed), and 0 hypopneas. Apnea index was 0.0. Hypopnea index was 0.0. The apnea-hypopnea index was  0.0 overall (0.0 supine, 0 non-supine; 0.0 REM, 0.0 supine REM).  There were 0 respiratory effort-related arousals (RERAs).   OXIMETRY: Oxyhemoglobin Saturation Nadir during sleep was at  95% from a mean of 96%.  Of the Total sleep time (TST) hypoxemia (<89%) was present for 1.6 minutes, or 2.1% of total sleep time.  LIMB MOVEMENTS: There were 0 periodic limb movements of sleep (0.0/h), of which 0 (0.0/hr) were associated with an arousal. AROUSAL: There were 9 arousals in total, for an arousal index of 7 arousals/hour.  Of these, 0 were identified as respiratory-related arousals (0 /h), 0 were PLM-related arousals (0 /h), and 12 were non-specific arousals (10 /h). EEG:  PSG EEG was of normal amplitude and frequency, with symmetric manifestation of sleep stages. There were sudden movements seen in N3 sleep, a sudden movement without changes in EEG within the first 12 minutes, and another microarousal at 3:37-, leading to a true arousal at 3:38 AM.  Wake EEG then shows a run of 6 spikes.3:38:55 AM, Epoch 656.  This peculiar EEG finding within seconds of arousal from SWS was attributed to artefact.   EKG: The electrocardiogram documented NSR with isolated ( 2) PACs in N3 sleep, at 3:01 and 3:03 AM.  The average heart rate during sleep was 74 bpm.  The heart rate during sleep varied between a minimum of Tachycardia and  a maximum of  88 bpm. AUDIO and VIDEO: unremarkable recording, without vocalization or complex movements.   IMPRESSION: No apnea, no hypoxia and no PLMs noted. Mild snoring was recorded   Severely delayed sleep onset and reduced sleep time with 207 minutes of wakefulness after sleep onset.  Highly unusual distribution of sleep stages with almost exclusively N3 sleep ( SWS or delta sleep).    RECOMMENDATIONS:  It was obviously a challenge for the patient to go to sleep and stay asleep.  The recorded sleep architecture was also unusual as was the EEG finding post -arousal.   I will contact  the patient directly and suggest to use an antiepileptic medication at bedtime with the goal to improve sleep onset and duration-( Gabapentin/Pregabalin/ Lamictal/) .   Melvyn Novas, MD [facility] Board certified by The [employer] of Sleep Medicine and Diplomate of the [employer] of Sleep Medicine. Board certified In Neurology through the ABPN, Fellow of the [employer] of Neu[employer].               In Sleep-laboratory Polysomnography with expanded EEG.    Name: Pheobe, Baymon BMI: 20.07 Physician: Melvyn Novas, MD  ID: [id number] Height: 68.0 in Technician: Margaretann Loveless, RPSGT  Sex: Female Weight: 132.0 lb Record: [id number]  Age: 31 [27-Dec-1985] Date: 09/23/2023    Medical & Medication History    KARSON SLATER is a 37 y.o. female patient who is here for revisit 05/14/2023 follow -up for a recent Visit with the ED. Chief concern according to patient: Patient is here for a suspected complex migraine.  on 04/24/23 she started feeling very dizzy after being in the gym for about 60 minutes of weightlifting, got dizzy while putting the weights away- and felt woozy- just on the way to the water fountain-. Quickly after that there was a "massive" left-sided headache. Pt states she then felt like she had difficulty with her gait feeling unsteady and couldn't call her daughter by name, difficulty getting her words out. She noted a "firework in the right eye" then the vision blacked out in the right eye- She went to her car, and had trouble buckling the kids in. She looked at her phone and couldn't sort the numbers, not recognizing the people's name - she called her husband (Dr Janee Morn, MD) and he noted her speech was slurred - he asked her to stop and called EMS to her location. The EMT found her incoherent, she went to the ED for evaluation. She had an MRI which was normal. Pt states has not had any more episodes since then.  Calculated pAHI (per hour): 1.5/h REM pAHI: 1.8/h NREM pAHI: 1.4/h  Adderall, Tums, Zyrtec, Clindagel, Bentyl, Lexapro, Allegra, Mirena, Claritin, Melatonin, Toprol - XL, Myrbetriq, Elocon, Multivitamin, Naproxen, Prednisolone, Trazodone, Retin-A, Aldactone   Sleep Disorder:  vivid dreams, hypersomnia, question of seizure.       Comments   The patient came into the lab for a Parasomnia- expanded EEG- PSG.  The patient had a prior HST on 05/01/23 with our sleep lab. AHI 1.5/h, REM AHI 1.8/h and NREM AHI 1.4/h. The patient took Melatonin and Trazodone prior to start of study. The patient had one restroom break. EKG showed no obvious cardiac arrythmias. Mild snoring. No REM sleep observed. Respiratory events scored with a 3% desat. Slept lateral. The patient had a delay in sleep onset. Also, she had periods of WASO.     Lights out: 10:15:10 PM Lights on: 05:22:52 AM   Time Total Supine Side Prone Upright  Recording (TRT) 7h 8.57m 2h 34.4m 4h 34.72m 0h 0.67m 0h 0.58m  Sleep (TST) 1h 14.83m 0h 0.46m 1h 14.71m 0h 0.91m 0h 0.41m   Latency N1 N2 N3 REM Onset Per. Slp. Eff.  Actual 2h 27.30m 2h 29.79m 4h 33.2m 0h 0.84m 2h 27.17m 4h 20.32m 17.29%   Stg Dur Wake N1 N2 N3 REM  Total 122.0 7.5 16.5 50.0 0.0  Supine 43.0 0.0 0.0 0.0 0.0  Side 79.0 7.5 16.5 50.0 0.0  Prone 0.0 0.0 0.0 0.0 0.0  Upright 0.0 0.0 0.0 0.0 0.0   Stg % Wake N1 N2 N3 REM  Total 62.2 10.1 22.3 67.6 0.0  Supine 21.9 0.0 0.0 0.0 0.0  Side 40.3 10.1 22.3 67.6 0.0  Prone 0.0 0.0 0.0 0.0 0.0  Upright 0.0 0.0 0.0 0.0 0.0     Apnea Summary Sub Supine Side Prone Upright  Total 0 Total 0 0 0 0 0    REM 0 0 0 0 0    NREM 0 0 0 0 0  Obs 0 REM 0 0 0 0 0    NREM 0 0 0 0 0  Mix 0 REM 0 0 0 0 0    NREM 0 0 0 0 0  Cen 0 REM 0 0 0 0 0    NREM 0 0 0 0 0   Rera Summary Sub Supine Side Prone Upright  Total 0 Total 0 0 0 0 0    REM 0 0 0 0 0    NREM 0 0 0 0 0  Hypopnea Summary Sub Supine Side Prone Upright  Total 0 Total 0 0 0 0 0    REM 0 0 0  0 0    NREM 0 0 0 0 0   4% Hypopnea Summary Sub Supine Side Prone Upright  Total (4%) 0 Total 0 0 0 0 0    REM 0 0 0 0 0    NREM 0 0 0 0 0     AHI Total Obs Mix Cen  0.00 Apnea 0.00 0.00 0.00 0.00   Hypopnea 0.00 -- -- --  0.00 Hypopnea (4%) 0.00 -- -- --    Total Supine Side Prone Upright  Position AHI 0.00 0.00 0.00 0.00 0.00  REM AHI 0.00   NREM AHI 0.00   Position RDI 0.00 0.00 0.00 0.00 0.00  REM RDI 0.00   NREM RDI 0.00    4% Hypopnea Total Supine Side Prone Upright  Position AHI (4%) 0.00 0.00 0.00 0.00 0.00  REM AHI (4%) 0.00   NREM AHI (4%) 0.00   Position RDI (4%) 0.00 0.00 0.00 0.00 0.00  REM RDI (4%) 0.00   NREM RDI (4%) 0.00    Desaturation Information Threshold: 2% <100% <90% <80% <70% <60% <50% <40%  Supine 40.0 0.0 0.0 0.0 0.0 0.0 0.0  Side 33.0 0.0 0.0 0.0 0.0 0.0 0.0  Prone 0.0 0.0 0.0 0.0 0.0 0.0 0.0  Upright 0.0 0.0 0.0 0.0 0.0 0.0 0.0  Total 73.0 0.0 0.0 0.0 0.0 0.0 0.0  Index 22.3 0.0 0.0 0.0 0.0 0.0 0.0   Threshold: 3% <100% <90% <80% <70% <60% <50% <40%  Supine 28.0 0.0 0.0 0.0 0.0 0.0 0.0  Side 11.0 0.0 0.0 0.0 0.0 0.0 0.0  Prone 0.0 0.0 0.0 0.0 0.0 0.0 0.0  Upright 0.0 0.0 0.0 0.0 0.0 0.0 0.0  Total 39.0 0.0 0.0 0.0 0.0 0.0 0.0  Index 11.9 0.0 0.0 0.0 0.0 0.0 0.0   Threshold: 4% <100% <90% <80% <70% <60% <50% <40%  Supine 17.0 0.0 0.0 0.0 0.0 0.0 0.0  Side 6.0 0.0 0.0 0.0 0.0 0.0 0.0  Prone 0.0 0.0 0.0 0.0 0.0 0.0 0.0  Upright 0.0 0.0 0.0 0.0 0.0 0.0 0.0  Total 23.0 0.0 0.0 0.0 0.0 0.0 0.0  Index 7.0 0.0 0.0 0.0 0.0 0.0 0.0   Threshold: 4% <100% <90% <80% <70% <60% <50% <40%  Supine 17 0 0 0 0 0 0  Side 6 0 0 0 0 0 0  Prone 0 0 0 0 0 0 0  Upright 0 0 0 0 0 0 0  Total 23 0 0 0 0 0 0   Awakening/Arousal Information # of Awakenings 5  Wake after sleep onset 207.71m  Wake after persistent sleep 99.87m   Arousal Assoc. Arousals Index  Apneas 0 0.0  Hypopneas 0 0.0  Leg Movements 0 0.0  Snore 0 0.0  PTT Arousals 0 0.0   Spontaneous 12 9.7  Total 12 9.7  Leg Movement Information PLMS LMs Index  Total LMs during PLMS 0 0.0  LMs w/ Microarousals 0 0.0   LM LMs Index  w/ Microarousal 0 0.0  w/ Awakening 0 0.0  w/ Resp Event 0 0.0  Spontaneous 0 0.0  Total 0 0.0     Desaturation threshold setting: 4% Minimum desaturation setting: 10 seconds SaO2 nadir: 81% EKG Rates EKG Avg Max Min  Awake 73 92 59  Asleep 74 88 62  EKG Events: none- NSR

## 2023-09-30 ENCOUNTER — Other Ambulatory Visit: Payer: Self-pay | Admitting: Neurology

## 2023-09-30 DIAGNOSIS — F518 Other sleep disorders not due to a substance or known physiological condition: Secondary | ICD-10-CM

## 2023-10-02 ENCOUNTER — Encounter: Payer: Self-pay | Admitting: Neurology

## 2023-10-24 ENCOUNTER — Ambulatory Visit: Payer: Commercial Managed Care - PPO | Admitting: Family Medicine

## 2023-10-24 VITALS — BP 110/72 | HR 118 | Temp 98.3°F | Ht 68.0 in | Wt 133.8 lb

## 2023-10-24 DIAGNOSIS — F418 Other specified anxiety disorders: Secondary | ICD-10-CM

## 2023-10-24 DIAGNOSIS — G479 Sleep disorder, unspecified: Secondary | ICD-10-CM

## 2023-10-24 NOTE — Assessment & Plan Note (Signed)
Stable. Continue trazodone as she undergoes further evaluation.

## 2023-10-24 NOTE — Progress Notes (Signed)
[facility] [address] [place] [address] Dept: [phone] Dept Fax: [phone]  Chronic Care Office Visit  Subjective:    Patient ID: Tracy Gallegos, female    DOB: [birth year], 37 y.o..   MRN: [id number]  Chief Complaint  Patient presents with   Depression    6 week f/u depression.    History of Present Illness:  Patient is in today for reassessment of chronic medical issues.  Tracy Gallegos has a history of depression with anxiety. She is managed on escitalopram 20 mg daily. She feels she is doing some better currently. She and her spouse have plans to start marriage counseling. She notes family stressors continue to foster some of these difficulties. She discuss some of the challenges she feels related to parenting  Tracy Gallegos remains on Adderall for fatigue/hypersomnolence issues. We had discussed her working with a psychiatrist or other prescribing behavioral health provider. She notes she has failed at completing paperwork to pursue this, but remains committed to trying to do this.    Tracy Gallegos had a recent sleep study. She reports there was concern for possible seizure activity during sleep. She is being set up for an EEG.  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   Elevated AST (SGOT) 05/01/2023   Ocular migraine 04/30/2023   Nocturia 04/02/2023   Night terrors 04/02/2023   Abnormal dreams 04/02/2023   Snoring 04/02/2023   History of colonic polyps 01/21/2023   Scarlet fever 01/21/2023   Irritable bowel syndrome (IBS) 10/16/2022   IUD (intrauterine device) in place 10/16/2022   Acne 10/16/2022   Hypersomnolence disorder 10/16/2022   Seborrhea 10/16/2021   Hair loss 10/16/2021    Chronic abdominal pain 08/02/2021   Fatigue 06/07/2019   Depression with anxiety 06/30/2018   History of gestational diabetes 06/30/2018   Sleep disorder, unspecified 06/30/2018   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   Anti-D antibodies present during pregnancy Oct 10, 2017   Primary female infertility 09/15/2014   Fibromyalgia 08/03/2013   Past Surgical History:  Procedure Laterality Date   OVARIAN CYST SURGERY     WISDOM TOOTH EXTRACTION     Family History  Problem Relation Age of Onset   Obesity Mother    Depression Mother    Alcohol abuse Mother    Heart disease Father    Early death Father    Obesity Brother    Alcohol abuse Maternal Uncle    Depression Maternal Uncle    Hypertension Maternal Grandmother    Depression Maternal Grandmother    Alcohol abuse Maternal Grandmother    Alzheimer's disease Maternal Grandmother    Depression Maternal Grandfather    Stroke Maternal Grandfather    Heart disease Maternal Grandfather    Suicidality Maternal Grandfather    Suicidality Paternal Grandmother    Depression Paternal Grandmother    Heart disease Paternal Grandfather    Early death Paternal Grandfather    Outpatient Medications Prior to Visit  Medication Sig Dispense Refill   amphetamine-dextroamphetamine (ADDERALL) 10 MG tablet Take 1 tablet (10 mg total) by mouth daily with breakfast. 30 tablet 0   calcium carbonate (TUMS - DOSED IN MG ELEMENTAL CALCIUM) 500 MG chewable tablet Chew 1 tablet by mouth daily.     cetirizine (ZYRTEC) 10 MG tablet Take  10 mg by mouth daily. Daily as needed     clindamycin (CLINDAGEL) 1 % gel Apply topically 2 (two) times daily. 30 g 0   dicyclomine (BENTYL) 20 MG tablet Take 0.5 tablets (10 mg total) by mouth 3 (three) times daily for abdominal pain. 45 tablet 5   escitalopram (LEXAPRO) 20 MG tablet Take 1 tablet (20 mg total) by mouth daily. 180 tablet 3   fexofenadine (ALLEGRA ALLERGY) 180 MG tablet Take 1 tablet (180 mg total) by mouth  daily. 100 tablet 1   ketoconazole (NIZORAL) 2 % shampoo Apply to scalp and shampoo as usual. Repeat weekly 120 mL 11   levonorgestrel (MIRENA) 20 MCG/DAY IUD 1 each by Intrauterine route once.     melatonin 3 MG TABS tablet Take 3 mg by mouth at bedtime.     mirabegron ER (MYRBETRIQ) 25 MG TB24 tablet Take 1 tablet (25 mg total) by mouth daily. 30 tablet 3   mometasone (ELOCON) 0.1 % cream Apply topically daily as needed for itching. 15 g 5   Multiple Vitamin (MULTIVITAMIN) tablet Take 1 tablet by mouth daily.     naproxen (NAPROSYN) 500 MG tablet Take 1 tablet (500 mg total) by mouth as needed. Takes as needed only 30 tablet 2   prednisoLONE acetate (PRED FORTE) 1 % ophthalmic suspension Place 4 drops into the right ear as needed for itching. 5 mL 5   traZODone (DESYREL) 50 MG tablet Take 1/2 tablet (25 mg total) by mouth at bedtime as needed for sleep. 30 tablet 3   tretinoin (RETIN-A) 0.01 % gel Apply topically at bedtime. 15 g 0   verapamil (CALAN-SR) 120 MG CR tablet Take 1 tablet (120 mg total) by mouth at bedtime. 30 tablet 1   spironolactone (ALDACTONE) 100 MG tablet Take 1 tablet (100 mg total) by mouth daily. 90 tablet 1   No facility-administered medications prior to visit.   Allergies  Allergen Reactions   Benadryl [Diphenhydramine] Other (See Comments)    Cramping muscles.   Objective:   Today's Vitals   10/24/23 0933  BP: 110/72  Pulse: (!) 118  Temp: 98.3 F (36.8 C)  TempSrc: Temporal  SpO2: 98%  Weight: 133 lb 12.8 oz (60.7 kg)  Height: 5\' 8"  (1.727 m)   Body mass index is 20.34 kg/m.   General: Well developed, well nourished. No acute distress. Psych: Alert and oriented. Normal mood and affect. More even today with emotions.  Health Maintenance Due  Topic Date Due   HPV VACCINES (2 - 3-dose SCDM series) 08/02/2020   INFLUENZA VACCINE  06/12/2023      10/24/2023   10:26 AM 04/30/2023    4:39 PM 04/16/2023    2:47 PM  Depression screen PHQ 2/9   Decreased Interest 1 1 1   Down, Depressed, Hopeless 0 1 0  PHQ - 2 Score 1 2 1   Altered sleeping 0 0 1  Tired, decreased energy 3 3 3   Change in appetite 0 0 0  Feeling bad or failure about yourself  2 1 1   Trouble concentrating 0 0 0  Moving slowly or fidgety/restless 0 0 0  Suicidal thoughts 0 0 0  PHQ-9 Score 6 6 6   Difficult doing work/chores Somewhat difficult Somewhat difficult Somewhat difficult      10/24/2023   10:27 AM 04/30/2023    4:39 PM 04/16/2023    2:47 PM 10/16/2022    3:57 PM  GAD 7 : Generalized Anxiety Score  Nervous, Anxious, on Edge  1 2 3 1   Control/stop worrying 2 3 2 2   Worry too much - different things 3 2 3 2   Trouble relaxing 1 1 2 3   Restless 0 0 0 0  Easily annoyed or irritable 3 3 3 3   Afraid - awful might happen 0 2 3 1   Total GAD 7 Score 10 13 16 12   Anxiety Difficulty Somewhat difficult Somewhat difficult Somewhat difficult Somewhat difficult     Assessment & Plan:   Problem List Items Addressed This Visit       Other   Depression with anxiety - Primary   Depression and anxiety are mildly improved. Continue escitalopram 20 mg daily. I continue to support her re-establishing with a prescribing behavioral health provider/psychiatrist so we can have their guidance on other adjuvant therapies.      Sleep disorder, unspecified   Stable. Continue trazodone as she undergoes further evaluation.       Return in about 3 months (around 01/22/2024) for Reassessment.   Loyola Mast, MD

## 2023-10-24 NOTE — Assessment & Plan Note (Signed)
Depression and anxiety are mildly improved. Continue escitalopram 20 mg daily. I continue to support her re-establishing with a prescribing behavioral health provider/psychiatrist so we can have their guidance on other adjuvant therapies.

## 2023-11-09 ENCOUNTER — Encounter: Payer: Self-pay | Admitting: Neurology

## 2023-11-10 ENCOUNTER — Other Ambulatory Visit: Payer: Commercial Managed Care - PPO | Admitting: *Deleted

## 2023-11-19 ENCOUNTER — Telehealth: Payer: Commercial Managed Care - PPO | Admitting: Nurse Practitioner

## 2023-11-19 ENCOUNTER — Other Ambulatory Visit (HOSPITAL_COMMUNITY): Payer: Self-pay

## 2023-11-19 DIAGNOSIS — M545 Low back pain, unspecified: Secondary | ICD-10-CM | POA: Diagnosis not present

## 2023-11-19 MED ORDER — CYCLOBENZAPRINE HCL 5 MG PO TABS
5.0000 mg | ORAL_TABLET | Freq: Three times a day (TID) | ORAL | 1 refills | Status: DC | PRN
Start: 2023-11-19 — End: 2024-08-25

## 2023-11-19 NOTE — Progress Notes (Signed)
 E-Visit for Back Pain   We are sorry that you are not feeling well.  Here is how we plan to help!  Based on what you have shared with me it looks like you mostly have acute back pain.  Acute back pain is defined as musculoskeletal pain that can resolve in 1-3 weeks with conservative treatment.  I have prescribed  Flexeril  5 mg every eight hours as needed which is a muscle relaxer  Some patients experience stomach irritation or in increased heartburn with anti-inflammatory drugs.  Please keep in mind that muscle relaxer's can cause fatigue and should not be taken while at work or driving.  Back pain is very common.  The pain often gets better over time.  The cause of back pain is usually not dangerous.  Most people can learn to manage their back pain on their own.  Home Care Stay active.  Start with short walks on flat ground if you can.  Try to walk farther each day. Do not sit, drive or stand in one place for more than 30 minutes.  Do not stay in bed. Do not avoid exercise or work.  Activity can help your back heal faster. Be careful when you bend or lift an object.  Bend at your knees, keep the object close to you, and do not twist. Sleep on a firm mattress.  Lie on your side, and bend your knees.  If you lie on your back, put a pillow under your knees. Only take medicines as told by your doctor. Put ice on the injured area. Put ice in a plastic bag Place a towel between your skin and the bag Leave the ice on for 15-20 minutes, 3-4 times a day for the first 2-3 days. 210 After that, you can switch between ice and heat packs. Ask your doctor about back exercises or massage. Avoid feeling anxious or stressed.  Find good ways to deal with stress, such as exercise.  Get Help Right Way If: Your pain does not go away with rest or medicine. Your pain does not go away in 1 week. You have new problems. You do not feel well. The pain spreads into your legs. You cannot control when you poop  (bowel movement) or pee (urinate) You feel sick to your stomach (nauseous) or throw up (vomit) You have belly (abdominal) pain. You feel like you may pass out (faint). If you develop a fever.  Make Sure you: Understand these instructions. Will watch your condition Will get help right away if you are not doing well or get worse.  Your e-visit answers were reviewed by a board certified advanced clinical practitioner to complete your personal care plan.  Depending on the condition, your plan could have included both over the counter or prescription medications.  If there is a problem please reply  once you have received a response from your provider.  Your safety is important to us .  If you have drug allergies check your prescription carefully.    You can use MyChart to ask questions about today's visit, request a non-urgent call back, or ask for a work or school excuse for 24 hours related to this e-Visit. If it has been greater than 24 hours you will need to follow up with your provider, or enter a new e-Visit to address those concerns.  You will get an e-mail in the next two days asking about your experience.  I hope that your e-visit has been valuable and will speed your  recovery. Thank you for using e-visits.   I spent approximately 5 minutes reviewing the patient's history, current symptoms and coordinating their care today.

## 2023-11-26 ENCOUNTER — Ambulatory Visit (INDEPENDENT_AMBULATORY_CARE_PROVIDER_SITE_OTHER): Payer: Commercial Managed Care - PPO | Admitting: Neurology

## 2023-11-26 ENCOUNTER — Encounter: Payer: Self-pay | Admitting: Neurology

## 2023-11-26 ENCOUNTER — Other Ambulatory Visit (HOSPITAL_BASED_OUTPATIENT_CLINIC_OR_DEPARTMENT_OTHER): Payer: Self-pay

## 2023-11-26 VITALS — BP 96/51 | HR 74 | Ht 68.0 in | Wt 138.6 lb

## 2023-11-26 DIAGNOSIS — G43109 Migraine with aura, not intractable, without status migrainosus: Secondary | ICD-10-CM | POA: Diagnosis not present

## 2023-11-26 DIAGNOSIS — G479 Sleep disorder, unspecified: Secondary | ICD-10-CM | POA: Diagnosis not present

## 2023-11-26 DIAGNOSIS — F514 Sleep terrors [night terrors]: Secondary | ICD-10-CM | POA: Diagnosis not present

## 2023-11-26 DIAGNOSIS — F518 Other sleep disorders not due to a substance or known physiological condition: Secondary | ICD-10-CM | POA: Diagnosis not present

## 2023-11-26 DIAGNOSIS — F519 Sleep disorder not due to a substance or known physiological condition, unspecified: Secondary | ICD-10-CM | POA: Diagnosis not present

## 2023-11-26 MED ORDER — TRAZODONE HCL 50 MG PO TABS
25.0000 mg | ORAL_TABLET | Freq: Every evening | ORAL | 3 refills | Status: AC | PRN
  Filled 2023-11-26 – 2024-01-16 (×2): qty 30, 60d supply, fill #0
  Filled 2024-06-16: qty 30, 60d supply, fill #1
  Filled 2024-09-30: qty 30, 60d supply, fill #2

## 2023-11-26 NOTE — Patient Instructions (Addendum)
 Dr Samara Crest to interpret 60 minute EEG and sign for ambulatory EEG.   Trazodone  for sleep is permitted, not addictive.  25 mg is enough to improve sleep latency.

## 2023-11-26 NOTE — Progress Notes (Signed)
 Provider:  Neomia Banner, MD  Primary Care Physician:  Graig Lawyer, MD [address] [place] [address]     Referring Provider: Graig Lawyer, Md [address] [place],  [place] [address]          Chief Complaint according to patient   Patient presents with:     New Patient (Initial Visit)           HISTORY OF PRESENT ILLNESS:  Tracy Gallegos is a 38 y.o. female patient who is here for revisit 11/26/2023 for  follow up on  her sleep study. .  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 Tracy Gallegos  was in a gymnastic competition and her "left side was off", her left arm was not moving the same, she got sick within a week, screaming because of headaches and vomiting, sense of head pressure. She had seizures and was in ICU, transferred from [facility] to [facility],  now finally at home and receiving PT, OT, ST . She is a first grader with a 6th grade reading level and now has to relearn walking, talking and  toileting.   Patient is here to follow up- has not yet had EEG.  Result of inpatient stydy:  No apnea, no hypoxia and no PLMs noted. Mild snoring was recorded    Severely delayed sleep onset and reduced sleep time with 207 minutes of wakefulness after sleep onset.  Highly unusual distribution of sleep stages with almost exclusively N3 sleep ( SWS or delta sleep).   RECOMMENDATIONS:  It was obviously a challenge for the patient to go to sleep and stay asleep.  The recorded sleep architecture was also unusual as was the EEG finding post -arousal. I  will contact the patient directly and suggest to use an antiepileptic medication at bedtime with the goal to improve sleep onset and duration-( Gabapentin/Pregabalin/ Lamictal /) .        This 38 year-old Female patient ( Tracy Gallegos) presented with night terrors, vivid dreams ,  parasomnia, insomnia and also snoring. TIA vs seizure versus complicated migraine were discussed in her latest (05-14-2023) visit with me. NEW Sleep Patient 04-30-2023 in room #1 with her young daughter.  Patient snores and wakes up in the middle of the night- gasping for air. Patient stated her mother had sleep apnea but was heavy. She is married to Harle Libra, MD with three young children, 2 healthy daughters and her 68-year-old son Tracy Gallegos, who has special needs, is non-ambulatory, used to have a feeding tube, and struggles with immune deficiency.   : "I have started to snore and wake up gasping for air, feeling as if I can't catch air , waking me up from sleep. Trazodone  has helped to sleep; Adderall had helped with fatigue".   Her previous sleep medicine physician Dr Everlene Hobby, MD, dx her with sleep deprivation and depression- and had placed her on Seroquel in 2004, and on Modafinil- repeated a PSG in 2009 - "both shown a lot of REM pressure and not enough slow wave sleep. I was on Prozac  at the time, too.  Now on prn Trazodone  and prnAdderall only 5 mg. ADDITIONAL INFORMATION:  The Epworth Sleepiness Scale endorsed at  8 /24 points (scores above or equal to 10 are suggestive of hypersomnolence). FSS endorsed at 55 /63 points.  Tracy Gallegos is a 38 y.o. female patient who is here for revisit 05/14/2023 follow -up for a recent Visit  with the ED  Chief concern according to patient :  Patient is here for a suspected complex migraine. on 04/24/23 she started feeling very dizzy after being in the gym for about 60 minutes of weight lifting , got dizy while putting the weights away- and felt woozy- just on the way to the water fountain-. Quickly after that there was a "massive" left-sided headache.  Pt states she then felt like she had difficulty with her gait feeling unsteady and couldn't call her daughter by name, difficulty getting her words out. She noted a "firework in the right eye"  then the vision blacked out in the right eye-   She went to her car, and had trouble buckling the kids in.   She looked at her phone and couldn't sort the numbers, not recognizing the people's name - she called her husband ( Dr Jiles Mote) and he noted her speech was slurred -  he asked her to stop and called EMS to her location.  The EMT found her incoherent , she went to the ED for evaluation. She had an MRI which was normal.  Pt states has not had any more episodes since then.     Review of Systems: Out of a complete 14 system review, the patient complains of only the following symptoms, and all other reviewed systems are negative.:  Fatigue, sleepiness , snoring, fragmented sleep, Insomnia,  abnormal dreams, sleep choking.     How likely are you to doze in the following situations: 0 = not likely, 1 = slight chance, 2 = moderate chance, 3 = high chance   Sitting and Reading? Watching Television? Sitting inactive in a public place (theater or meeting)? As a passenger in a car for an hour without a break? Lying down in the afternoon when circumstances permit? Sitting and talking to someone? Sitting quietly after lunch without alcohol? In a car, while stopped for a few minutes in traffic?   Total = 4/ 24 points   FSS endorsed at 51/ 63 points.     Social History   Socioeconomic History   Marital status: Married    Spouse name: Tracy Gallegos   Number of children: 3   Years of education: Not on file   Highest education level: Bachelor's degree (e.g., BA, AB, BS)  Occupational History   Not on file  Tobacco Use   Smoking status: Never   Smokeless tobacco: Never  Vaping Use   Vaping status: Never Used  Substance and Sexual Activity   Alcohol use: No   Drug use: No   Sexual activity: Yes    Birth control/protection: I.U.D., None  Other Topics Concern   Not on file  Social History Narrative   Special needs child   Social Drivers of Health   Financial Resource Strain: Patient  Declined (10/24/2023)   Overall Financial Resource Strain (CARDIA)    Difficulty of Paying Living Expenses: Patient declined  Food Insecurity: Patient Declined (10/24/2023)   Hunger Vital Sign    Worried About Running Out of Food in the Last Year: Patient declined    Ran Out of Food in the Last Year: Patient declined  Transportation Needs: Patient Declined (10/24/2023)   PRAPARE - Administrator, Civil Service (Medical): Patient declined    Lack of Transportation (Non-Medical): Patient declined  Physical Activity: Insufficiently Active (08/29/2023)   Exercise Vital  Sign    Days of Exercise per Week: 2 days    Minutes of Exercise per Session: 30 min  Stress: Stress Concern Present (08/29/2023)   [employer] of Occupational Health - Occupational Stress Questionnaire    Feeling of Stress : To some extent  Social Connections: Unknown (10/24/2023)   Social Connection and Isolation Panel [NHANES]    Frequency of Communication with Friends and Family: Patient declined    Frequency of Social Gatherings with Friends and Family: Patient declined    Attends Religious Services: Patient declined    Database administrator or Organizations: Patient declined    Attends Engineer, structural: More than 4 times per year    Marital Status: Patient declined    Family History  Problem Relation Age of Onset   Obesity Mother    Depression Mother    Alcohol abuse Mother    Heart disease Father    Early death Father    Obesity Brother    Alcohol abuse Maternal Uncle    Depression Maternal Uncle    Hypertension Maternal Grandmother    Depression Maternal Grandmother    Alcohol abuse Maternal Grandmother    Alzheimer's disease Maternal Grandmother    Depression Maternal Grandfather    Stroke Maternal Grandfather    Heart disease Maternal Grandfather    Suicidality Maternal Grandfather    Suicidality Paternal Grandmother    Depression Paternal Grandmother    Heart disease  Paternal Grandfather    Early death Paternal Grandfather     Past Medical History:  Diagnosis Date   Depression    Endometriosis    History of gestational diabetes 01/23/2018   Ovarian cyst    Bilateral    Past Surgical History:  Procedure Laterality Date   OVARIAN CYST SURGERY     WISDOM TOOTH EXTRACTION       Current Outpatient Medications on File Prior to Visit  Medication Sig Dispense Refill   amphetamine -dextroamphetamine  (ADDERALL) 10 MG tablet Take 1 tablet (10 mg total) by mouth daily with breakfast. 30 tablet 0   calcium  carbonate (TUMS - DOSED IN MG ELEMENTAL CALCIUM ) 500 MG chewable tablet Chew 1 tablet by mouth daily.     cetirizine (ZYRTEC) 10 MG tablet Take 10 mg by mouth daily. Daily as needed     clindamycin  (CLINDAGEL ) 1 % gel Apply topically 2 (two) times daily. 30 g 0   cyclobenzaprine  (FLEXERIL ) 5 MG tablet Take 1 tablet (5 mg total) by mouth 3 (three) times daily as needed for muscle spasms. 30 tablet 1   dicyclomine  (BENTYL ) 20 MG tablet Take 0.5 tablets (10 mg total) by mouth 3 (three) times daily for abdominal pain. 45 tablet 5   escitalopram  (LEXAPRO ) 20 MG tablet Take 1 tablet (20 mg total) by mouth daily. 180 tablet 3   fexofenadine  (ALLEGRA  ALLERGY) 180 MG tablet Take 1 tablet (180 mg total) by mouth daily. 100 tablet 1   ketoconazole  (NIZORAL ) 2 % shampoo Apply to scalp and shampoo as usual. Repeat weekly 120 mL 11   levonorgestrel  (MIRENA) 20 MCG/DAY IUD 1 each by Intrauterine route once.     melatonin 3 MG TABS tablet Take 3 mg by mouth at bedtime.     mirabegron  ER (MYRBETRIQ ) 25 MG TB24 tablet Take 1 tablet (25 mg total) by mouth daily. 30 tablet 3   mometasone  (ELOCON ) 0.1 % cream Apply topically daily as needed for itching. 15 g 5   Multiple Vitamin (MULTIVITAMIN) tablet Take 1 tablet  by mouth daily.     naproxen  (NAPROSYN ) 500 MG tablet Take 1 tablet (500 mg total) by mouth as needed. Takes as needed only 30 tablet 2   prednisoLONE  acetate (PRED  FORTE) 1 % ophthalmic suspension Place 4 drops into the right ear as needed for itching. 5 mL 5   spironolactone  (ALDACTONE ) 100 MG tablet Take 1 tablet (100 mg total) by mouth daily. 90 tablet 1   traZODone  (DESYREL ) 50 MG tablet Take 1/2 tablet (25 mg total) by mouth at bedtime as needed for sleep. 30 tablet 3   tretinoin  (RETIN-A ) 0.01 % gel Apply topically at bedtime. 15 g 0   verapamil  (CALAN -SR) 120 MG CR tablet Take 1 tablet (120 mg total) by mouth at bedtime. 30 tablet 1   No current facility-administered medications on file prior to visit.    Allergies  Allergen Reactions   Benadryl  [Diphenhydramine ] Other (See Comments)    Cramping muscles.     DIAGNOSTIC DATA (LABS, IMAGING, TESTING) - I reviewed patient records, labs, notes, testing and imaging myself where available.  Lab Results  Component Value Date   WBC 12.3 (H) 04/24/2023   HGB 13.3 04/24/2023   HCT 39.0 04/24/2023   MCV 89.6 04/24/2023   PLT 228 04/24/2023      Component Value Date/Time   NA 136 04/30/2023 1637   K 4.3 04/30/2023 1637   CL 102 04/30/2023 1637   CO2 27 04/30/2023 1637   GLUCOSE 85 04/30/2023 1637   BUN 10 04/30/2023 1637   CREATININE 0.86 04/30/2023 1637   CALCIUM  9.1 04/30/2023 1637   PROT 7.0 04/30/2023 1637   ALBUMIN 4.4 04/30/2023 1637   AST 55 (H) 04/30/2023 1637   ALT 30 04/30/2023 1637   ALKPHOS 49 04/30/2023 1637   BILITOT 1.6 (H) 04/30/2023 1637   GFRNONAA >60 04/24/2023 1437   Lab Results  Component Value Date   CHOL 138 04/30/2023   HDL 49.40 04/30/2023   LDLCALC 79 04/30/2023   TRIG 51.0 04/30/2023   CHOLHDL 3 04/30/2023   Lab Results  Component Value Date   HGBA1C 5.4 08/02/2021   No results found for: "VITAMINB12" Lab Results  Component Value Date   TSH 1.99 06/09/2019    PHYSICAL EXAM:  Today's Vitals   11/26/23 1516  BP: (!) 96/51  Pulse: 74  Weight: 138 lb 9.6 oz (62.9 kg)  Height: 5\' 8"  (1.727 m)   Body mass index is 21.07 kg/m.   Wt  Readings from Last 3 Encounters:  11/26/23 138 lb 9.6 oz (62.9 kg)  10/24/23 133 lb 12.8 oz (60.7 kg)  08/29/23 137 lb (62.1 kg)     Ht Readings from Last 3 Encounters:  11/26/23 5\' 8"  (1.727 m)  10/24/23 5\' 8"  (1.727 m)  08/29/23 5\' 8"  (1.727 m)      General: The patient is awake, alert and appears not in acute distress. The patient is well groomed. Head: Normocephalic, atraumatic. Neck is supple. without distended neck veins. Respiratory: Lungs are clear to auscultation.  Skin:  Without evidence of ankle edema, or rash. Trunk: The patient's posture is erect.   NEUROLOGIC EXAM: The patient is awake and alert, oriented to place and time.   Memory subjective described as intact.  Attention span & concentration ability appears normal.  Speech is fluent,  without  dysarthria, dysphonia or aphasia.  Mood and affect are appropriate.   Cranial nerves: no loss of smell or taste reported  Pupils are equal and briskly reactive  to light. Funduscopic exam deferred..  Extraocular movements in vertical and horizontal planes were intact and without nystagmus. No Diplopia. Visual fields by finger perimetry are intact. Hearing was intact to soft voice and finger rubbing.    Facial sensation intact to fine touch.  Facial motor strength is symmetric and tongue and uvula move midline.  Neck ROM : rotation, tilt and flexion extension were normal for age and shoulder shrug was symmetrical.    Motor exam:  Symmetric bulk, tone and ROM.   Normal tone without cog wheeling, symmetric grip strength .   Sensory:  Fine touch, pinprick and vibration were tested  and  normal.  Proprioception tested in the upper extremities was normal.   Coordination: Rapid alternating movements in the fingers/hands were of normal speed.  The Finger-to-nose maneuver was intact without evidence of ataxia, dysmetria or tremor.      ASSESSMENT AND PLAN 38 y.o. year old female  here with:    1) abnormal EEG activity in  a 70 minute recording during PSG ,  will follow with an in lab EEG of 60 minutes and if abnormal decide if we can do an EEG study ambulatory at home.   2) I would like to assure better sleep, in light of the constant sleep deprivation, care giver burden. Trazodone  25 mg- 50 mg renewed. Prn HS , I wrote for 45 tab for 90 days      I plan to follow up either virtually or face to face within 3 months. Dr Samara Crest ordered a study in his name, please, keep this on record.  Follow up on EEG 60 minutes.   I would like to thank Graig Lawyer, MD for allowing me to meet with and to take care of this pleasant patient.   .  After spending a total time of  30  minutes face to face and additional time for physical and neurologic examination, review of laboratory studies,  personal review of imaging studies, reports and results of other testing and review of referral information / records as far as provided in visit,   Electronically signed by: Neomia Banner, MD 11/26/2023 3:49 PM  [facility] Board certified by The [employer] of Sleep Medicine and Diplomate of the [employer] of Sleep Medicine. Board certified In Neurology through the ABPN, Fellow of the [employer] of Neu[employer].

## 2023-12-08 ENCOUNTER — Other Ambulatory Visit (HOSPITAL_BASED_OUTPATIENT_CLINIC_OR_DEPARTMENT_OTHER): Payer: Self-pay

## 2023-12-25 ENCOUNTER — Other Ambulatory Visit (HOSPITAL_BASED_OUTPATIENT_CLINIC_OR_DEPARTMENT_OTHER): Payer: Self-pay

## 2024-01-16 ENCOUNTER — Other Ambulatory Visit: Payer: Self-pay | Admitting: Family Medicine

## 2024-01-16 ENCOUNTER — Encounter (HOSPITAL_COMMUNITY): Payer: Self-pay | Admitting: Pharmacist

## 2024-01-16 ENCOUNTER — Other Ambulatory Visit: Payer: Self-pay

## 2024-01-16 ENCOUNTER — Other Ambulatory Visit (HOSPITAL_COMMUNITY): Payer: Self-pay

## 2024-01-16 MED ORDER — SPIRONOLACTONE 100 MG PO TABS
100.0000 mg | ORAL_TABLET | Freq: Every day | ORAL | 1 refills | Status: AC
  Filled 2024-01-16 – 2024-06-16 (×2): qty 90, 90d supply, fill #0
  Filled 2024-12-08: qty 90, 90d supply, fill #1

## 2024-01-20 ENCOUNTER — Other Ambulatory Visit (HOSPITAL_COMMUNITY): Payer: Self-pay

## 2024-01-21 ENCOUNTER — Other Ambulatory Visit (HOSPITAL_BASED_OUTPATIENT_CLINIC_OR_DEPARTMENT_OTHER): Payer: Self-pay

## 2024-01-21 ENCOUNTER — Other Ambulatory Visit: Payer: Self-pay | Admitting: Family Medicine

## 2024-01-21 DIAGNOSIS — G479 Sleep disorder, unspecified: Secondary | ICD-10-CM

## 2024-01-21 DIAGNOSIS — R5383 Other fatigue: Secondary | ICD-10-CM

## 2024-01-21 MED ORDER — AMPHETAMINE-DEXTROAMPHETAMINE 10 MG PO TABS
10.0000 mg | ORAL_TABLET | Freq: Every day | ORAL | 0 refills | Status: DC
  Filled 2024-01-21: qty 30, 30d supply, fill #0

## 2024-01-22 ENCOUNTER — Other Ambulatory Visit (HOSPITAL_BASED_OUTPATIENT_CLINIC_OR_DEPARTMENT_OTHER): Payer: Self-pay

## 2024-01-27 ENCOUNTER — Ambulatory Visit: Admitting: Family Medicine

## 2024-01-27 ENCOUNTER — Other Ambulatory Visit (HOSPITAL_BASED_OUTPATIENT_CLINIC_OR_DEPARTMENT_OTHER): Payer: Self-pay

## 2024-01-27 ENCOUNTER — Encounter: Payer: Self-pay | Admitting: Family Medicine

## 2024-01-27 VITALS — BP 108/70 | HR 69 | Temp 98.1°F | Ht 68.0 in | Wt 130.8 lb

## 2024-01-27 DIAGNOSIS — R509 Fever, unspecified: Secondary | ICD-10-CM

## 2024-01-27 DIAGNOSIS — J029 Acute pharyngitis, unspecified: Secondary | ICD-10-CM | POA: Diagnosis not present

## 2024-01-27 DIAGNOSIS — H6593 Unspecified nonsuppurative otitis media, bilateral: Secondary | ICD-10-CM | POA: Diagnosis not present

## 2024-01-27 LAB — POCT INFLUENZA A/B
Influenza A, POC: NEGATIVE
Influenza B, POC: NEGATIVE

## 2024-01-27 LAB — POCT RAPID STREP A (OFFICE): Rapid Strep A Screen: NEGATIVE

## 2024-01-27 LAB — POC COVID19 BINAXNOW: SARS Coronavirus 2 Ag: NEGATIVE

## 2024-01-27 MED ORDER — FLUCONAZOLE 150 MG PO TABS
150.0000 mg | ORAL_TABLET | Freq: Once | ORAL | 0 refills | Status: AC
  Filled 2024-01-27: qty 1, 1d supply, fill #0

## 2024-01-27 MED ORDER — AMOXICILLIN-POT CLAVULANATE 875-125 MG PO TABS
1.0000 | ORAL_TABLET | Freq: Two times a day (BID) | ORAL | 0 refills | Status: DC
  Filled 2024-01-27: qty 20, 10d supply, fill #0

## 2024-01-27 NOTE — Progress Notes (Signed)
 Acute Office Visit  Subjective:     Patient ID: Tracy Gallegos, female    DOB: [birth year], 38 y.o.   MRN: [id number]  Chief Complaint  Patient presents with   Acute Visit    Ongoing for week 1/2, felt better started feeling bad again on 03/14. nasal congestion, sore throat, vomiting, fever, right ear pain, green mucus, has tried Tylenol/Ibuprofen, Sudafed, ear drops    HPI Patient is in today for evaluation of nasal congestion, sore throat, ear pain, fatigue, intermittent fever for the last 10 days. Reports viral illness last week, then 5 days ago, symptoms returned and were worse, with added nausea and vomiting. Vomiting has since resolved. Has tried OTC cough and cold with little relief. Reports that all 3 of her children have been sick in the last week Denies abdominal pain, diarrhea, rash, other symptoms.  Medical hx as outlined below.  ROS Per HPI      Objective:    BP 108/70 (BP Location: Left Arm, Patient Position: Sitting)   Pulse 69   Temp 98.1 F (36.7 C) (Temporal)   Ht 5\' 8"  (1.727 m)   Wt 130 lb 12.8 oz (59.3 kg)   SpO2 99%   BMI 19.89 kg/m    Physical Exam Vitals and nursing note reviewed.  Constitutional:      General: She is not in acute distress.    Appearance: Normal appearance. She is normal weight.     Comments: Appears fatigued   HENT:     Head: Normocephalic and atraumatic.     Right Ear: Ear canal normal. A middle ear effusion is present. Tympanic membrane is erythematous and bulging.     Left Ear: Ear canal normal. A middle ear effusion is present. Tympanic membrane is erythematous and bulging.     Nose: Congestion present.     Right Sinus: Frontal sinus tenderness present.     Mouth/Throat:     Mouth: Mucous membranes are moist.     Pharynx: Oropharynx is clear. No oropharyngeal exudate or posterior oropharyngeal erythema.     Comments: Oropharyngeal cobblestoning   Eyes:     Extraocular Movements: Extraocular movements  intact.     Pupils: Pupils are equal, round, and reactive to light.  Cardiovascular:     Rate and Rhythm: Normal rate and regular rhythm.     Heart sounds: Normal heart sounds.  Pulmonary:     Effort: Pulmonary effort is normal. No respiratory distress.     Breath sounds: Normal breath sounds. No wheezing, rhonchi or rales.  Abdominal:     General: Abdomen is flat.  Musculoskeletal:        General: Normal range of motion.     Cervical back: Normal range of motion and neck supple.  Lymphadenopathy:     Cervical: Cervical adenopathy present.  Skin:    General: Skin is warm and dry.  Neurological:     General: No focal deficit present.     Mental Status: She is alert and oriented to person, place, and time.  Psychiatric:        Mood and Affect: Mood normal.        Thought Content: Thought content normal.    Results for orders placed or performed in visit on 01/27/24  POCT Influenza A/B  Result Value Ref Range   Influenza A, POC Negative Negative   Influenza B, POC Negative Negative  POC COVID-19 BinaxNow  Result Value Ref Range   SARS Coronavirus 2 Ag Negative  Negative  POCT rapid strep A  Result Value Ref Range   Rapid Strep A Screen Negative Negative        Assessment & Plan:   Bilateral otitis media with effusion -     Amoxicillin-Pot Clavulanate; Take 1 tablet by mouth 2 (two) times daily.  Dispense: 20 tablet; Refill: 0 -     Fluconazole; Take 1 tablet (150 mg total) by mouth once for 1 dose.  Dispense: 1 tablet; Refill: 0  Fever, unspecified fever cause -     POCT Influenza A/B -     POC COVID-19 BinaxNow  Sore throat -     POCT Influenza A/B -     POC COVID-19 BinaxNow -     POCT rapid strep A  Viral and strep testing is negative today  Meds ordered this encounter  Medications   amoxicillin-clavulanate (AUGMENTIN) 875-125 MG tablet    Sig: Take 1 tablet by mouth 2 (two) times daily.    Dispense:  20 tablet    Refill:  0   fluconazole (DIFLUCAN) 150  MG tablet    Sig: Take 1 tablet (150 mg total) by mouth once for 1 dose.    Dispense:  1 tablet    Refill:  0    Return if symptoms worsen or fail to improve.  Moshe Cipro, FNP

## 2024-01-27 NOTE — Patient Instructions (Signed)
 I have sent in Augmentin for you to take twice a day for 10 days.  This medication can upset your stomach, so I tell everyone to take it with a meal.  I have also sent in Diflucan for you to take in case of yeast after antibiotic treatment.  If you are having symptoms when you complete antibiotics, may take 1 tablet.  If you are still having symptoms 3 days later, may take the second tablet.  Follow-up with me for new or worsening symptoms.

## 2024-02-27 ENCOUNTER — Encounter: Payer: Self-pay | Admitting: Neurology

## 2024-03-04 ENCOUNTER — Other Ambulatory Visit (HOSPITAL_COMMUNITY): Payer: Self-pay

## 2024-03-31 ENCOUNTER — Other Ambulatory Visit (HOSPITAL_BASED_OUTPATIENT_CLINIC_OR_DEPARTMENT_OTHER): Payer: Self-pay

## 2024-04-08 ENCOUNTER — Telehealth: Admitting: Physician Assistant

## 2024-04-08 DIAGNOSIS — H6504 Acute serous otitis media, recurrent, right ear: Secondary | ICD-10-CM

## 2024-04-08 DIAGNOSIS — H60391 Other infective otitis externa, right ear: Secondary | ICD-10-CM | POA: Diagnosis not present

## 2024-04-08 MED ORDER — CEFDINIR 300 MG PO CAPS
300.0000 mg | ORAL_CAPSULE | Freq: Two times a day (BID) | ORAL | 0 refills | Status: DC
Start: 2024-04-08 — End: 2024-06-07
  Filled 2024-04-23: qty 20, 10d supply, fill #0

## 2024-04-08 MED ORDER — CIPROFLOXACIN-DEXAMETHASONE 0.3-0.1 % OT SUSP: 4.0000 [drp] | Freq: Two times a day (BID) | OTIC | 0 refills | Status: AC

## 2024-04-08 NOTE — Progress Notes (Signed)
 E-Visit for Ear Pain - Acute Otitis Media   We are sorry that you are not feeling well. Here is how we plan to help!  Based on what you have shared with me it looks like you have Acute Otitis Media.  Acute Otitis Media is an infection of the middle or "inner" ear. This type of infection can cause redness, inflammation, and fluid buildup behind the tympanic membrane (ear drum).  The usual symptoms include: Earache/Pain Fever Upper respiratory symptoms Lack of energy/Fatigue/Malaise Slight hearing loss gradually worsening- if the inner ear fills with fluid What causes middle ear infections? Most middle ear infections occur when an infection such as a cold, leads to a build-up of mucus in the middle ear and causes the Eustachian tube (a thin tube that runs from the middle ear to the back of the nose) to become swollen or blocked.   This means mucus can't drain away properly, making it easier for an infection to spread into the middle ear.  How middle ear infections are treated: Most ear infections clear up within three to five days and don't need any specific treatment. If necessary, tylenol  or ibuprofen  should be used to relieve pain and a high temperature.  If you develop a fever higher than 102, or any significantly worsening symptoms, this could indicate a more serious infection moving to the middle/inner and needs face to face evaluation in an office by a provider.   Antibiotics aren't routinely used to treat middle ear infections, although they may occasionally be prescribed if symptoms persist or are particularly severe. Given your presentation,   I have prescribed Cefdinir 300mg  Take 1 capsule twice daily for 10 days.  I have prescribed: Ciprofloxacin  0.3% and dexamethasone  0.1% otic suspension four drops in affected ears two times a day for 7 days   Your symptoms should improve over the next 3 days and should resolve in about 7 days. Be sure to complete ALL of the prescription(s)  given.  HOME CARE: Wash your hands frequently. If you are prescribed an ear drop, do not place the tip of the bottle on your ear or touch it with your fingers. You can take Acetaminophen  650 mg every 4-6 hours as needed for pain.  If pain is severe or moderate, you can apply a heating pad (set on low) or hot water bottle (wrapped in a towel) to outer ear for 20 minutes.  This will also increase drainage.  GET HELP RIGHT AWAY IF: Fever is over 102.2 degrees. You develop progressive ear pain or hearing loss. Ear symptoms persist longer than 3 days after treatment.  MAKE SURE YOU: Understand these instructions. Will watch your condition. Will get help right away if you are not doing well or get worse.  Thank you for choosing an e-visit.  Your e-visit answers were reviewed by a board certified advanced clinical practitioner to complete your personal care plan. Depending upon the condition, your plan could have included both over the counter or prescription medications.  Please review your pharmacy choice. Make sure the pharmacy is open so you can pick up the prescription now. If there is a problem, you may contact your provider through [employer] and have the prescription routed to another pharmacy.  Your safety is important to us . If you have drug allergies check your prescription carefully.   For the next 24 hours you can use MyChart to ask questions about today's visit, request a non-urgent call back, or ask for a work or school  excuse. You will get an email with a survey after your eVisit asking about your experience. We would appreciate your feedback. I hope that your e-visit has been valuable and will aid in your recovery.    I have spent 5 minutes in review of e-visit questionnaire, review and updating patient chart, medical decision making and response to patient.   Angelia Kelp, PA-C

## 2024-04-20 ENCOUNTER — Other Ambulatory Visit: Payer: Self-pay | Admitting: Family Medicine

## 2024-04-20 ENCOUNTER — Other Ambulatory Visit (HOSPITAL_BASED_OUTPATIENT_CLINIC_OR_DEPARTMENT_OTHER): Payer: Self-pay

## 2024-04-20 DIAGNOSIS — G479 Sleep disorder, unspecified: Secondary | ICD-10-CM

## 2024-04-20 DIAGNOSIS — R5383 Other fatigue: Secondary | ICD-10-CM

## 2024-04-20 MED ORDER — AMPHETAMINE-DEXTROAMPHETAMINE 10 MG PO TABS
10.0000 mg | ORAL_TABLET | Freq: Every day | ORAL | 0 refills | Status: DC
  Filled 2024-04-20: qty 30, 30d supply, fill #0

## 2024-04-21 ENCOUNTER — Encounter (HOSPITAL_BASED_OUTPATIENT_CLINIC_OR_DEPARTMENT_OTHER): Payer: Self-pay

## 2024-04-21 ENCOUNTER — Other Ambulatory Visit: Payer: Self-pay

## 2024-04-21 ENCOUNTER — Other Ambulatory Visit (HOSPITAL_BASED_OUTPATIENT_CLINIC_OR_DEPARTMENT_OTHER): Payer: Self-pay

## 2024-04-23 ENCOUNTER — Other Ambulatory Visit (HOSPITAL_BASED_OUTPATIENT_CLINIC_OR_DEPARTMENT_OTHER): Payer: Self-pay

## 2024-04-23 MED ORDER — CIPROFLOXACIN-DEXAMETHASONE 0.3-0.1 % OT SUSP
4.0000 [drp] | Freq: Two times a day (BID) | OTIC | 0 refills | Status: DC
  Filled 2024-04-23: qty 7.5, 7d supply, fill #0

## 2024-06-07 ENCOUNTER — Ambulatory Visit: Admitting: Nurse Practitioner

## 2024-06-07 ENCOUNTER — Encounter: Payer: Self-pay | Admitting: Nurse Practitioner

## 2024-06-07 ENCOUNTER — Other Ambulatory Visit: Payer: Self-pay

## 2024-06-07 ENCOUNTER — Other Ambulatory Visit (HOSPITAL_BASED_OUTPATIENT_CLINIC_OR_DEPARTMENT_OTHER): Payer: Self-pay

## 2024-06-07 ENCOUNTER — Inpatient Hospital Stay (HOSPITAL_BASED_OUTPATIENT_CLINIC_OR_DEPARTMENT_OTHER)
Admission: EM | Admit: 2024-06-07 | Discharge: 2024-06-10 | DRG: 872 | Disposition: A | Discharge status: Home / Self Care | Attending: Family Medicine | Admitting: Family Medicine

## 2024-06-07 ENCOUNTER — Emergency Department (HOSPITAL_BASED_OUTPATIENT_CLINIC_OR_DEPARTMENT_OTHER)

## 2024-06-07 VITALS — BP 106/70 | HR 94 | Temp 97.3°F | Ht 68.0 in | Wt 131.0 lb

## 2024-06-07 DIAGNOSIS — Z818 Family history of other mental and behavioral disorders: Secondary | ICD-10-CM

## 2024-06-07 DIAGNOSIS — H60331 Swimmer's ear, right ear: Secondary | ICD-10-CM | POA: Diagnosis present

## 2024-06-07 DIAGNOSIS — H60391 Other infective otitis externa, right ear: Secondary | ICD-10-CM | POA: Diagnosis present

## 2024-06-07 DIAGNOSIS — Z885 Allergy status to narcotic agent status: Secondary | ICD-10-CM

## 2024-06-07 DIAGNOSIS — H6091 Unspecified otitis externa, right ear: Secondary | ICD-10-CM | POA: Diagnosis present

## 2024-06-07 DIAGNOSIS — M316 Other giant cell arteritis: Secondary | ICD-10-CM | POA: Diagnosis present

## 2024-06-07 DIAGNOSIS — G4700 Insomnia, unspecified: Secondary | ICD-10-CM | POA: Diagnosis present

## 2024-06-07 DIAGNOSIS — R221 Localized swelling, mass and lump, neck: Secondary | ICD-10-CM | POA: Diagnosis not present

## 2024-06-07 DIAGNOSIS — R110 Nausea: Secondary | ICD-10-CM

## 2024-06-07 DIAGNOSIS — M797 Fibromyalgia: Secondary | ICD-10-CM | POA: Diagnosis present

## 2024-06-07 DIAGNOSIS — A419 Sepsis, unspecified organism: Principal | ICD-10-CM | POA: Diagnosis present

## 2024-06-07 DIAGNOSIS — G43109 Migraine with aura, not intractable, without status migrainosus: Secondary | ICD-10-CM | POA: Diagnosis present

## 2024-06-07 DIAGNOSIS — G4750 Parasomnia, unspecified: Secondary | ICD-10-CM | POA: Diagnosis present

## 2024-06-07 DIAGNOSIS — H60509 Unspecified acute noninfective otitis externa, unspecified ear: Secondary | ICD-10-CM | POA: Diagnosis present

## 2024-06-07 DIAGNOSIS — F909 Attention-deficit hyperactivity disorder, unspecified type: Secondary | ICD-10-CM | POA: Diagnosis present

## 2024-06-07 DIAGNOSIS — Z975 Presence of (intrauterine) contraceptive device: Secondary | ICD-10-CM

## 2024-06-07 DIAGNOSIS — L659 Nonscarring hair loss, unspecified: Secondary | ICD-10-CM | POA: Diagnosis present

## 2024-06-07 DIAGNOSIS — L03211 Cellulitis of face: Secondary | ICD-10-CM | POA: Diagnosis present

## 2024-06-07 DIAGNOSIS — H6021 Malignant otitis externa, right ear: Principal | ICD-10-CM

## 2024-06-07 DIAGNOSIS — Z79899 Other long term (current) drug therapy: Secondary | ICD-10-CM

## 2024-06-07 DIAGNOSIS — K111 Hypertrophy of salivary gland: Secondary | ICD-10-CM | POA: Diagnosis not present

## 2024-06-07 DIAGNOSIS — Z8632 Personal history of gestational diabetes: Secondary | ICD-10-CM

## 2024-06-07 DIAGNOSIS — H6691 Otitis media, unspecified, right ear: Secondary | ICD-10-CM | POA: Diagnosis not present

## 2024-06-07 DIAGNOSIS — Z888 Allergy status to other drugs, medicaments and biological substances status: Secondary | ICD-10-CM

## 2024-06-07 DIAGNOSIS — H60501 Unspecified acute noninfective otitis externa, right ear: Secondary | ICD-10-CM

## 2024-06-07 DIAGNOSIS — Z8249 Family history of ischemic heart disease and other diseases of the circulatory system: Secondary | ICD-10-CM

## 2024-06-07 DIAGNOSIS — H9201 Otalgia, right ear: Secondary | ICD-10-CM | POA: Diagnosis not present

## 2024-06-07 DIAGNOSIS — F331 Major depressive disorder, recurrent, moderate: Secondary | ICD-10-CM | POA: Diagnosis present

## 2024-06-07 DIAGNOSIS — D72829 Elevated white blood cell count, unspecified: Secondary | ICD-10-CM | POA: Diagnosis present

## 2024-06-07 DIAGNOSIS — Z8661 Personal history of infections of the central nervous system: Secondary | ICD-10-CM

## 2024-06-07 DIAGNOSIS — H9191 Unspecified hearing loss, right ear: Secondary | ICD-10-CM | POA: Diagnosis present

## 2024-06-07 DIAGNOSIS — F514 Sleep terrors [night terrors]: Secondary | ICD-10-CM | POA: Diagnosis present

## 2024-06-07 DIAGNOSIS — R519 Headache, unspecified: Secondary | ICD-10-CM | POA: Diagnosis not present

## 2024-06-07 DIAGNOSIS — R7303 Prediabetes: Secondary | ICD-10-CM | POA: Diagnosis present

## 2024-06-07 LAB — COMPREHENSIVE METABOLIC PANEL WITH GFR
ALT: 10 U/L (ref 0–44)
AST: 16 U/L (ref 15–41)
Albumin: 4.3 g/dL (ref 3.5–5.0)
Alkaline Phosphatase: 57 U/L (ref 38–126)
Anion gap: 10 (ref 5–15)
BUN: 9 mg/dL (ref 6–20)
CO2: 25 mmol/L (ref 22–32)
Calcium: 9.1 mg/dL (ref 8.9–10.3)
Chloride: 101 mmol/L (ref 98–111)
Creatinine, Ser: 0.88 mg/dL (ref 0.44–1.00)
GFR, Estimated: >60 mL/min (ref >60)
Glucose, Bld: 183 mg/dL — ABNORMAL HIGH (ref 70–99)
Potassium: 3.6 mmol/L (ref 3.5–5.1)
Sodium: 136 mmol/L (ref 135–145)
Total Bilirubin: 1.6 mg/dL — ABNORMAL HIGH (ref 0.0–1.2)
Total Protein: 6.9 g/dL (ref 6.5–8.1)

## 2024-06-07 LAB — CBC WITH DIFFERENTIAL/PLATELET
Abs Immature Granulocytes: 0.05 K/uL (ref 0.00–0.07)
Basophils Absolute: 0 K/uL (ref 0.0–0.1)
Basophils Relative: 0 %
Eosinophils Absolute: 0 K/uL (ref 0.0–0.5)
Eosinophils Relative: 0 %
HCT: 36.7 % (ref 36.0–46.0)
Hemoglobin: 12.6 g/dL (ref 12.0–15.0)
Immature Granulocytes: 0 %
Lymphocytes Relative: 7 %
Lymphs Abs: 0.9 K/uL (ref 0.7–4.0)
MCH: 31.7 pg (ref 26.0–34.0)
MCHC: 34.3 g/dL (ref 30.0–36.0)
MCV: 92.2 fL (ref 80.0–100.0)
Monocytes Absolute: 0.7 K/uL (ref 0.1–1.0)
Monocytes Relative: 5 %
Neutro Abs: 11.9 K/uL — ABNORMAL HIGH (ref 1.7–7.7)
Neutrophils Relative %: 88 %
Platelets: 191 K/uL (ref 150–400)
RBC: 3.98 MIL/uL (ref 3.87–5.11)
RDW: 12.5 % (ref 11.5–15.5)
WBC: 13.6 K/uL — ABNORMAL HIGH (ref 4.0–10.5)
nRBC: 0 % (ref 0.0–0.2)

## 2024-06-07 LAB — HCG, SERUM, QUALITATIVE: Preg, Serum: NEGATIVE

## 2024-06-07 LAB — C-REACTIVE PROTEIN: CRP: 3 mg/dL — ABNORMAL HIGH (ref <1.0)

## 2024-06-07 LAB — SEDIMENTATION RATE: Sed Rate: 5 mm/h (ref 0–22)

## 2024-06-07 LAB — LACTIC ACID, PLASMA: Lactic Acid, Venous: 1.5 mmol/L (ref 0.5–1.9)

## 2024-06-07 MED ORDER — CEFDINIR 300 MG PO CAPS
300.0000 mg | ORAL_CAPSULE | Freq: Two times a day (BID) | ORAL | 0 refills | Status: DC
  Filled 2024-06-07: qty 20, 10d supply, fill #0

## 2024-06-07 MED ORDER — CIPROFLOXACIN IN D5W 400 MG/200ML IV SOLN
400.0000 mg | Freq: Once | INTRAVENOUS | Status: AC
  Administered 2024-06-07: 400 mg via INTRAVENOUS
  Filled 2024-06-07: qty 200

## 2024-06-07 MED ORDER — KETOROLAC TROMETHAMINE 10 MG PO TABS
10.0000 mg | ORAL_TABLET | Freq: Four times a day (QID) | ORAL | 0 refills | Status: DC | PRN
  Filled 2024-06-07: qty 20, 5d supply, fill #0

## 2024-06-07 MED ORDER — ONDANSETRON HCL 4 MG/2ML IJ SOLN
4.0000 mg | Freq: Once | INTRAMUSCULAR | Status: AC
  Administered 2024-06-07: 4 mg via INTRAVENOUS
  Filled 2024-06-07: qty 2

## 2024-06-07 MED ORDER — MORPHINE SULFATE (PF) 4 MG/ML IV SOLN
4.0000 mg | Freq: Once | INTRAVENOUS | Status: AC
  Administered 2024-06-07: 4 mg via INTRAVENOUS
  Filled 2024-06-07: qty 1

## 2024-06-07 MED ORDER — LORAZEPAM 2 MG/ML IJ SOLN
0.5000 mg | Freq: Once | INTRAMUSCULAR | Status: AC
  Administered 2024-06-07: 0.5 mg via INTRAVENOUS
  Filled 2024-06-07: qty 1

## 2024-06-07 MED ORDER — CLINDAMYCIN HCL 300 MG PO CAPS
300.0000 mg | ORAL_CAPSULE | Freq: Three times a day (TID) | ORAL | 0 refills | Status: DC
  Filled 2024-06-07: qty 30, 10d supply, fill #0

## 2024-06-07 MED ORDER — KETOROLAC TROMETHAMINE 30 MG/ML IJ SOLN
30.0000 mg | Freq: Once | INTRAMUSCULAR | Status: AC
  Administered 2024-06-07: 30 mg via INTRAVENOUS
  Filled 2024-06-07: qty 1

## 2024-06-07 MED ORDER — CEFTRIAXONE SODIUM 1 G IJ SOLR
1.0000 g | Freq: Once | INTRAMUSCULAR | Status: AC
  Administered 2024-06-07: 1 g via INTRAMUSCULAR

## 2024-06-07 MED ORDER — KETOROLAC TROMETHAMINE 60 MG/2ML IM SOLN
60.0000 mg | Freq: Once | INTRAMUSCULAR | Status: AC
  Administered 2024-06-07: 60 mg via INTRAMUSCULAR

## 2024-06-07 MED ORDER — LACTATED RINGERS IV BOLUS
1000.0000 mL | Freq: Once | INTRAVENOUS | Status: AC
  Administered 2024-06-07: 1000 mL via INTRAVENOUS

## 2024-06-07 MED ORDER — IOHEXOL 300 MG/ML  SOLN
75.0000 mL | Freq: Once | INTRAMUSCULAR | Status: AC | PRN
  Administered 2024-06-07: 75 mL via INTRAVENOUS

## 2024-06-07 MED ORDER — LIDOCAINE 5 % EX PTCH
1.0000 | MEDICATED_PATCH | CUTANEOUS | Status: DC
  Administered 2024-06-07 – 2024-06-08 (×2): 1 via TRANSDERMAL
  Filled 2024-06-07 (×3): qty 1

## 2024-06-07 MED ORDER — PIPERACILLIN-TAZOBACTAM 3.375 G IVPB 30 MIN
3.3750 g | Freq: Once | INTRAVENOUS | Status: AC
  Administered 2024-06-07: 3.375 g via INTRAVENOUS
  Filled 2024-06-07: qty 50

## 2024-06-07 MED ORDER — ONDANSETRON HCL 4 MG PO TABS
4.0000 mg | ORAL_TABLET | Freq: Three times a day (TID) | ORAL | 0 refills | Status: DC | PRN
  Filled 2024-06-07: qty 30, 10d supply, fill #0

## 2024-06-07 NOTE — Progress Notes (Signed)
 Acute Office Visit  Subjective:     Patient ID: Tracy Gallegos, female    DOB: 1986-04-30, 38 y.o.   MRN: [id number]  Chief Complaint  Patient presents with   Ear Pain    Right ear pain since Saturday    HPI Discussed the use of AI scribe software for clinical note transcription with the patient, who gave verbal consent to proceed.  History of Present Illness   Tracy Gallegos is a 38 year old female who presents with severe otitis externa.  She experiences significant swelling and pain in the right ear, extending to the jaw, with no fever but nausea due to pain. Symptoms began on Friday and worsening last night and this morning. She has used ciprofloxacin  drops and prednisolone  previously, but they are ineffective this time. Ibuprofen  and acetaminophen  are used for pain management. The swelling is more pronounced, and the ear is protruding. Pain is primarily on the right side, with sore throat and nasal congestion, attributed to crying. She attempted to wash her ear with shampoo last week, which may have exacerbated the condition. No fever, other illnesses, or pain in the other ear. She is concerned about the infection spreading.      ROS See pertinent positives and negatives per HPI.     Objective:    BP 106/70 (BP Location: Left Arm, Patient Position: Sitting, Cuff Size: Small)   Pulse 94   Temp (!) 97.3 F (36.3 C)   Ht 5' 8 (1.727 m)   Wt 131 lb (59.4 kg)   SpO2 98%   BMI 19.92 kg/m    Physical Exam Vitals and nursing note reviewed.  Constitutional:      General: She is not in acute distress.    Appearance: Normal appearance.  HENT:     Head: Normocephalic.     Right Ear: Hearing normal. Drainage and swelling present.     Left Ear: Tympanic membrane, ear canal and external ear normal.     Ears:     Comments: Unable to visualize right TM. Severe pain with minimal movement of pinna Eyes:     Conjunctiva/sclera: Conjunctivae normal.  Pulmonary:      Effort: Pulmonary effort is normal.  Musculoskeletal:     Cervical back: Normal range of motion.  Skin:    General: Skin is warm.  Neurological:     General: No focal deficit present.     Mental Status: She is alert and oriented to person, place, and time.  Psychiatric:        Mood and Affect: Mood normal. Affect is tearful.        Behavior: Behavior normal.        Thought Content: Thought content normal.        Judgment: Judgment normal.      Assessment & Plan:   Severe Otitis Externa   Severe otitis externa is progressing with swelling to the front of the ear despite ciprofloxacin  and prednisolone . Broader antibiotic coverage is necessary. Administer Toradol  60mg  IM for pain and Rocephin  1gram IM for immediate antibiotic coverage. Prescribe cefdinir  300 mg twice daily and clindamycin  300mg  TID. Recommend probiotics or yogurt to prevent antibiotic-associated diarrhea. Refer to ENT for further evaluation. Advise her to go to the ER if symptoms worsen or don't improve some by the next day.  Nausea Nausea occurring due to pain. Will start zofran  4mg  PO every 8 hours as needed for nausea.   Meds ordered this encounter  Medications  cefdinir  (OMNICEF ) 300 MG capsule    Sig: Take 1 capsule (300 mg total) by mouth 2 (two) times daily.    Dispense:  20 capsule    Refill:  0   clindamycin  (CLEOCIN ) 300 MG capsule    Sig: Take 1 capsule (300 mg total) by mouth 3 (three) times daily.    Dispense:  30 capsule    Refill:  0   cefTRIAXone  (ROCEPHIN ) injection 1 g   ketorolac  (TORADOL ) injection 60 mg   ketorolac  (TORADOL ) 10 MG tablet    Sig: Take 1 tablet (10 mg total) by mouth every 6 (six) hours as needed.    Dispense:  20 tablet    Refill:  0   ondansetron  (ZOFRAN ) 4 MG tablet    Sig: Take 1 tablet (4 mg total) by mouth every 8 (eight) hours as needed.    Dispense:  30 tablet    Refill:  0    Return if symptoms worsen or fail to improve.  Tracy DELENA Harada, NP

## 2024-06-07 NOTE — Progress Notes (Signed)
 Plan of Care Note for accepted transfer   Patient: Tracy Gallegos MRN: [id number]   DOA: 06/07/2024  Facility requesting transfer: [facility]   Requesting Provider: Dr. Dreama   Reason for transfer: Right otitis externa with surrounding cellulitis and severe pain   Facility course: 38 yr old female with depression, insomnia, and fibromyalgia presents with worsening severe right ear pain and swelling.   She is afebrile with stable vitals. WBC is 13.6 with normal lactate. CT demonstrates right otitis externa with surrounding cellulitis but no fluid collection or osteomyelitis.   ED physician discussed the case with ENT (Dr. Okey) who did not think there was any need for surgery. Patient was treated with IVF, Toradol , morphine , Ativan , ciprofloxacin , and Zosyn .   Plan of care: The patient is accepted for admission to Med-surg  unit, at [facility].   Author: Evalene GORMAN Sprinkles, MD 06/07/2024  Check [email or web address] for on-call coverage.  Nursing staff, Please call [facility] Admits & Consults System-Wide number on Amion as soon as patient's arrival, so appropriate admitting provider can evaluate the pt.

## 2024-06-07 NOTE — Plan of Care (Signed)
   Problem: Coping: Goal: Level of anxiety will decrease Outcome: Progressing   Problem: Pain Managment: Goal: General experience of comfort will improve and/or be controlled Outcome: Progressing

## 2024-06-07 NOTE — ED Triage Notes (Signed)
 Pt reports recurrent ear infection on Saturday, tx with at home Rx medications.  Last night sx worsened and pt saw PCP today for same where she received an injection of rocephin  and Toradol  today and advised to report to ED if sx do not improve within the hour.  Pt reports sx have not resolved at all.  Pt add'lly took tylenol  at home prior to ED arrival.  Pt tearful in triage.

## 2024-06-07 NOTE — Patient Instructions (Signed)
 It was great to see you!  Start omnicef  1 capsule twice a day  Start clindamycin  1 capsule 3 times a day   We are giving you rocephin  and toradol  in the office today  Start toradol  1 tablet every 6 hours as needed for pain   Go to the ER if symptoms worsen or don't improve by tomorrow  Take care,  Tinnie Harada, NP

## 2024-06-07 NOTE — ED Provider Notes (Signed)
 [place] EMERGENCY DEPARTMENT AT [facility] Provider Note   CSN: [id number] Arrival date & time: 06/07/24  1522     Patient presents with: Otalgia   Tracy Gallegos is a 38 y.o. female.   HPI     38 year old female with a history of depression, endometriosis, sees neurology due to sleep disorder including night terrors, vivid dreams, parasomnia, and some insomnia, complicated migraine, history of otitis externa, who presents with concern for severe, intractable ear pain radiating to head and neck.  She was seen today and diagnosis severe otitis externa which had progressive swelling of the front of the ear despite ciprofloxacin  and prednisolone .  She was given Toradol  and Rocephin  in the office and prescribed cefdinir  twice daily and clindamycin  3 times daily today.  Severe pain around the right ear, and into right side of neck  Difficulty moving head Saturday is when it started, and this morning around 3AM Around 1PM was seen and getting worse  No vomiting, no fevers Nausea intermittently Can hear a little bit out of right but is muffled Denies numbness, weakness, difficulty talking or walking, visual changes or facial droop.   Has had drainage from that ear  Has been a good summer, under less stress than they have been in the past (discussed daughter was sick in December/mycoplasma meningoencephalitis, hospitalized in ICU, now has made a full recovery)      Past Medical History:  Diagnosis Date   Depression    Endometriosis    History of gestational diabetes 01/23/2018   Ovarian cyst    Bilateral     Prior to Admission medications   Medication Sig Start Date End Date Taking? Authorizing Provider  amphetamine -dextroamphetamine  (ADDERALL) 10 MG tablet Take 1 tablet (10 mg total) by mouth daily with breakfast. 04/20/24   Thedora Garnette HERO, MD  calcium  carbonate (TUMS - DOSED IN MG ELEMENTAL CALCIUM ) 500 MG chewable tablet Chew 1 tablet by mouth daily.     [provider]  cefdinir  (OMNICEF ) 300 MG capsule Take 1 capsule (300 mg total) by mouth 2 (two) times daily. 06/07/24   McElwee, Lauren A, NP  cetirizine (ZYRTEC) 10 MG tablet Take 10 mg by mouth daily. Daily as needed    [provider]  ciprofloxacin -dexamethasone  (CIPRODEX ) OTIC suspension Place 4 drops into the right ear 2 (two) times daily for 7 days. 04/08/24   Vivienne Delon HERO, PA-C  clindamycin  (CLEOCIN ) 300 MG capsule Take 1 capsule (300 mg total) by mouth 3 (three) times daily. 06/07/24   McElwee, Lauren A, NP  clindamycin  (CLINDAGEL ) 1 % gel Apply topically 2 (two) times daily. 03/19/23   Moishe Chiquita HERO, NP  cyclobenzaprine  (FLEXERIL ) 5 MG tablet Take 1 tablet (5 mg total) by mouth 3 (three) times daily as needed for muscle spasms. 11/19/23   Kennyth Domino, FNP  dicyclomine  (BENTYL ) 20 MG tablet Take 0.5 tablets (10 mg total) by mouth 3 (three) times daily for abdominal pain. 01/20/23     escitalopram  (LEXAPRO ) 20 MG tablet Take 1 tablet (20 mg total) by mouth daily. 08/29/23   Thedora Garnette HERO, MD  fexofenadine  (ALLEGRA  ALLERGY) 180 MG tablet Take 1 tablet (180 mg total) by mouth daily. 03/23/23   Lavell Bari DELENA, FNP  ketoconazole  (NIZORAL ) 2 % shampoo Apply to scalp and shampoo as usual. Repeat weekly 06/12/23     ketorolac  (TORADOL ) 10 MG tablet Take 1 tablet (10 mg total) by mouth every 6 (six) hours as needed. 06/07/24   Nedra Maxwell  A, NP  levonorgestrel  (MIRENA) 20 MCG/DAY IUD 1 each by Intrauterine route once.    [provider]  melatonin 3 MG TABS tablet Take 3 mg by mouth at bedtime.    [provider]  mirabegron  ER (MYRBETRIQ ) 25 MG TB24 tablet Take 1 tablet (25 mg total) by mouth daily. 08/21/22     mometasone  (ELOCON ) 0.1 % cream Apply topically daily as needed for itching. 12/31/22   Karis Clunes, MD  Multiple Vitamin (MULTIVITAMIN) tablet Take 1 tablet by mouth daily.    [provider]  naproxen  (NAPROSYN ) 500 MG tablet Take 1  tablet (500 mg total) by mouth as needed. Takes as needed only 01/21/23   Thedora Garnette HERO, MD  ondansetron  (ZOFRAN ) 4 MG tablet Take 1 tablet (4 mg total) by mouth every 8 (eight) hours as needed. 06/07/24   McElwee, Lauren A, NP  prednisoLONE  acetate (PRED FORTE ) 1 % ophthalmic suspension Place 4 drops into the right ear as needed for itching. 08/16/22   Karis Clunes, MD  spironolactone  (ALDACTONE ) 100 MG tablet Take 1 tablet (100 mg total) by mouth daily. 01/16/24 06/07/24  Thedora Garnette HERO, MD  traZODone  (DESYREL ) 50 MG tablet Take 1/2 tablet (25 mg total) by mouth at bedtime as needed for sleep. 11/26/23   Dohmeier, Dedra, MD  tretinoin  (RETIN-A ) 0.01 % gel Apply topically at bedtime. 03/19/23   Moishe Chiquita HERO, NP  verapamil  (CALAN -SR) 120 MG CR tablet Take 1 tablet (120 mg total) by mouth at bedtime. 05/14/23   Dohmeier, Dedra, MD    Allergies: Morphine  sulfate and Benadryl  [diphenhydramine ]    Review of Systems  Updated Vital Signs BP 104/63 (BP Location: Left Arm)   Pulse (!) 102   Temp 97.7 F (36.5 C) (Axillary)   Resp 14   SpO2 97%   Physical Exam Vitals and nursing note reviewed.  Constitutional:      General: She is in acute distress.     Appearance: She is well-developed. She is not diaphoretic.     Comments: Tearful, rocking in bed due to pain  HENT:     Head: Normocephalic and atraumatic.     Right Ear: Swelling (unable to visualize TM due to severe pain, canal swelling) and tenderness present.     Ears:     Comments: Pain with movement right ear, tenderness surrounding ear over mastoid process and extending inferiorly in neck, anterior/jaw tenderness . No sinus tenderness.  Eyes:     General: No visual field deficit.    Conjunctiva/sclera: Conjunctivae normal.  Cardiovascular:     Rate and Rhythm: Normal rate and regular rhythm.     Pulses: Normal pulses.  Pulmonary:     Effort: Pulmonary effort is normal. No respiratory distress.  Abdominal:     General: There is no  distension.  Musculoskeletal:        General: No tenderness.     Cervical back: Normal range of motion.  Skin:    General: Skin is warm and dry.     Findings: No erythema or rash.  Neurological:     Mental Status: She is alert and oriented to person, place, and time.     GCS: GCS eye subscore is 4. GCS verbal subscore is 5. GCS motor subscore is 6.     Cranial Nerves: No cranial nerve deficit, dysarthria or facial asymmetry.     Sensory: Sensation is intact.     Motor: Motor function is intact. No weakness.  Coordination: Finger-Nose-Finger Test normal.     (all labs ordered are listed, but only abnormal results are displayed) Labs Reviewed  CBC WITH DIFFERENTIAL/PLATELET - Abnormal; Notable for the following components:      Result Value   WBC 13.6 (*)    Neutro Abs 11.9 (*)    All other components within normal limits  COMPREHENSIVE METABOLIC PANEL WITH GFR - Abnormal; Notable for the following components:   Glucose, Bld 183 (*)    Total Bilirubin 1.6 (*)    All other components within normal limits  C-REACTIVE PROTEIN - Abnormal; Notable for the following components:   CRP 3.0 (*)    All other components within normal limits  CULTURE, BLOOD (ROUTINE X 2)  CULTURE, BLOOD (ROUTINE X 2)  LACTIC ACID, PLASMA  SEDIMENTATION RATE  HCG, SERUM, QUALITATIVE    EKG: None  Radiology: CT Soft Tissue Neck W Contrast Result Date: 06/07/2024 CLINICAL DATA:  Soft tissue infection suspected. Right otitis externa progressive swelling despite antibiotics. EXAM: CT NECK WITH CONTRAST TECHNIQUE: Multidetector CT imaging of the neck was performed using the standard protocol following the bolus administration of intravenous contrast. RADIATION DOSE REDUCTION: This exam was performed according to the departmental dose-optimization program which includes automated exposure control, adjustment of the mA and/or kV according to patient size and/or use of iterative reconstruction technique.  CONTRAST:  75mL OMNIPAQUE  IOHEXOL  300 MG/ML  SOLN COMPARISON:  None Available. FINDINGS: There is significant soft tissue swelling along the right external auditory canal compatible with otitis externa. Swelling primarily involves the cartilaginous portion of the canal. There is additional abnormal soft tissue within the superior aspect of the osseous external auditory canal. There is mild narrowing of the right external auditory canal. Stranding within the surrounding subcutaneous tissues of the right ear extending inferiorly into the subcutaneous tissues adjacent to the posterior and superficial aspects of the right parotid gland. No focal fluid collection to suggest abscess formation. The right parotid gland is asymmetrically enlarged with mild enhancement likely reflecting reactive inflammatory changes. There is no evidence of soft tissue swelling within deeper spaces of the neck. Pharynx and larynx: The nasopharynx is symmetric. Symmetric appearance of the palatine tonsils. The oral cavity, floor of mouth, and base of tongue are unremarkable. The epiglottis is unremarkable. Normal appearance of the retropharynx. Piriform sinuses and aryepiglottic folds are symmetric. Symmetric appearance of the vocal folds. Salivary glands: Right parotid gland changes as above. The left parotid gland and submandibular glands are unremarkable. No abnormal calcification. Thyroid : Normal. Lymph nodes: Right level 2A cervical node measuring up to 1.1 cm in short axis demonstrating mild enhancement, likely reactive in setting of infection. There are additional asymmetrically prominent subcentimeter lymph nodes in right level 1 B and level 2 which are also likely reactive. Vascular: Negative. Limited intracranial: Limited visualization of intracranial structures without focal acute abnormality. Visualized orbits: Negative. Mastoids and visualized paranasal sinuses: Minimal mucosal thickening in the alveolar recess of the right  maxillary sinus. No air-fluid levels. Mastoid air cells are clear. Skeleton: No acute or aggressive process. Upper chest: Negative. Other: None. IMPRESSION: Soft tissue swelling along the right external auditory canal compatible with a tightest externa. There is stranding within the adjacent subcutaneous tissues extending inferiorly over the parotid space compatible cellulitis. Asymmetric enlargement and enhancement of the right parotid gland likely reflecting reactive inflammatory changes. No fluid collection or evidence of abscess formation. No swelling within the deeper spaces of the neck. Prominent right-sided cervical nodes, likely reactive. Electronically Signed  By: Donnice Mania M.D.   On: 06/07/2024 20:37   CT Head Wo Contrast Result Date: 06/07/2024 CLINICAL DATA:  Headache EXAM: CT HEAD WITHOUT CONTRAST TECHNIQUE: Contiguous axial images were obtained from the base of the skull through the vertex without intravenous contrast. RADIATION DOSE REDUCTION: This exam was performed according to the departmental dose-optimization program which includes automated exposure control, adjustment of the mA and/or kV according to patient size and/or use of iterative reconstruction technique. COMPARISON:  None Available. FINDINGS: Brain: No acute intracranial abnormality. Specifically, no hemorrhage, hydrocephalus, mass lesion, acute infarction, or significant intracranial injury. Vascular: No hyperdense vessel or unexpected calcification. Skull: No acute calvarial abnormality. Sinuses/Orbits: No acute findings Other: None IMPRESSION: Normal study. Electronically Signed   By: Franky Crease M.D.   On: 06/07/2024 20:23     Procedures   Medications Ordered in the ED  lidocaine  (LIDODERM ) 5 % 1 patch (1 patch Transdermal Patch Applied 06/07/24 2204)  morphine  (PF) 4 MG/ML injection 4 mg (4 mg Intravenous Given 06/07/24 1846)  ondansetron  (ZOFRAN ) injection 4 mg (4 mg Intravenous Given 06/07/24 1847)   piperacillin -tazobactam (ZOSYN ) IVPB 3.375 g (0 g Intravenous Stopped 06/07/24 1935)  lactated ringers  bolus 1,000 mL (0 mLs Intravenous Stopped 06/07/24 2045)  LORazepam  (ATIVAN ) injection 0.5 mg (0.5 mg Intravenous Given 06/07/24 1909)  iohexol  (OMNIPAQUE ) 300 MG/ML solution 75 mL (75 mLs Intravenous Contrast Given 06/07/24 2015)  morphine  (PF) 4 MG/ML injection 4 mg (4 mg Intravenous Given 06/07/24 2022)  ondansetron  (ZOFRAN ) injection 4 mg (4 mg Intravenous Given 06/07/24 2051)  LORazepam  (ATIVAN ) injection 0.5 mg (0.5 mg Intravenous Given 06/07/24 2132)  ciprofloxacin  (CIPRO ) IVPB 400 mg (400 mg Intravenous New Bag/Given 06/07/24 2131)  ketorolac  (TORADOL ) 30 MG/ML injection 30 mg (30 mg Intravenous Given 06/07/24 2125)                                      38 year old female with a history of depression, endometriosis, sees neurology due to sleep disorder including night terrors, vivid dreams, parasomnia, and some insomnia, complicated migraine, history of otitis externa, who presents with concern for severe, intractable ear pain radiating to head and neck.  DDx includes otitis externa, malignant otitis externa, mastoiditis, retropharyngeal abscess, ICH, lemierre's. Considered dissection of carotid artery however given significant tenderness, swelling over ear and posterior auricular region have low clinical suspicion for this.   Concern given rapid development of severe,intractable pain is for malignant otitis externa.  Ordered culture (she requested self-swab) and labs including blood cultures.  Labs completed and personally evaluated interpreted by me show a mild leukocytosis of 13,600.  Lactic acid is within normal limits.  CMP shows mild hyperglycemia, mild hyperbilirubinemia anemia similar to prior, no other acute abnormalities.  ESR is normal.  CRP is mildly elevated.  hCG is negative.  CT head and CT soft tissue neck obtained to evaluate for signs of intracranial hemorrhage, bony  involvement of otitis or other complications/abscess.   CT shows soft tissue swelling along the right external auditory canal compatible with otitis externa with stranding within the adjacent subcutaneous tissues extending inferiorly over the parotid space compatible with cellulitis, asymmetric enhancement of the right parotid gland likely reflecting reactive inflammatory changes, prominent right side lymph nodes likely reactive.  There is no evidence of fluid collection or abscess formation, no extension of infection into the deeper spaces of the neck.  Mastoid air cells are clear.  She is having severe pain and is tearful, yelling out  Immediately after receiving her second dose of morphine  she developed abdominal cramping as well which was severe. Given ativan  for anxiety.   Given severity of pain, failure of outpatient drops for 48 hours, will admit for IV antibiotic treatment of otitis externa.  Given Zosyn  and Cipro .       Final diagnoses:  Acute malignant otitis externa of right ear    ED Discharge Orders     None          Dreama Longs, MD 06/07/24 [phone]

## 2024-06-08 ENCOUNTER — Encounter (HOSPITAL_COMMUNITY): Payer: Self-pay | Admitting: Family Medicine

## 2024-06-08 DIAGNOSIS — M316 Other giant cell arteritis: Secondary | ICD-10-CM | POA: Diagnosis not present

## 2024-06-08 DIAGNOSIS — R7303 Prediabetes: Secondary | ICD-10-CM | POA: Diagnosis not present

## 2024-06-08 DIAGNOSIS — H6091 Unspecified otitis externa, right ear: Secondary | ICD-10-CM | POA: Diagnosis not present

## 2024-06-08 DIAGNOSIS — Z79899 Other long term (current) drug therapy: Secondary | ICD-10-CM | POA: Diagnosis not present

## 2024-06-08 DIAGNOSIS — Z8632 Personal history of gestational diabetes: Secondary | ICD-10-CM | POA: Diagnosis not present

## 2024-06-08 DIAGNOSIS — G4700 Insomnia, unspecified: Secondary | ICD-10-CM | POA: Diagnosis not present

## 2024-06-08 DIAGNOSIS — G4750 Parasomnia, unspecified: Secondary | ICD-10-CM | POA: Diagnosis present

## 2024-06-08 DIAGNOSIS — A419 Sepsis, unspecified organism: Secondary | ICD-10-CM

## 2024-06-08 DIAGNOSIS — H9191 Unspecified hearing loss, right ear: Secondary | ICD-10-CM | POA: Diagnosis present

## 2024-06-08 DIAGNOSIS — H60311 Diffuse otitis externa, right ear: Secondary | ICD-10-CM | POA: Diagnosis not present

## 2024-06-08 DIAGNOSIS — H9201 Otalgia, right ear: Secondary | ICD-10-CM | POA: Diagnosis present

## 2024-06-08 DIAGNOSIS — Z975 Presence of (intrauterine) contraceptive device: Secondary | ICD-10-CM | POA: Diagnosis not present

## 2024-06-08 DIAGNOSIS — Z818 Family history of other mental and behavioral disorders: Secondary | ICD-10-CM | POA: Diagnosis not present

## 2024-06-08 DIAGNOSIS — M797 Fibromyalgia: Secondary | ICD-10-CM | POA: Diagnosis not present

## 2024-06-08 DIAGNOSIS — F331 Major depressive disorder, recurrent, moderate: Secondary | ICD-10-CM | POA: Diagnosis not present

## 2024-06-08 DIAGNOSIS — F909 Attention-deficit hyperactivity disorder, unspecified type: Secondary | ICD-10-CM | POA: Diagnosis not present

## 2024-06-08 DIAGNOSIS — Z8661 Personal history of infections of the central nervous system: Secondary | ICD-10-CM | POA: Diagnosis not present

## 2024-06-08 DIAGNOSIS — F514 Sleep terrors [night terrors]: Secondary | ICD-10-CM | POA: Diagnosis present

## 2024-06-08 DIAGNOSIS — H60509 Unspecified acute noninfective otitis externa, unspecified ear: Secondary | ICD-10-CM | POA: Diagnosis present

## 2024-06-08 DIAGNOSIS — Z8249 Family history of ischemic heart disease and other diseases of the circulatory system: Secondary | ICD-10-CM | POA: Diagnosis not present

## 2024-06-08 DIAGNOSIS — G43109 Migraine with aura, not intractable, without status migrainosus: Secondary | ICD-10-CM | POA: Diagnosis present

## 2024-06-08 DIAGNOSIS — L03211 Cellulitis of face: Secondary | ICD-10-CM | POA: Insufficient documentation

## 2024-06-08 DIAGNOSIS — H60391 Other infective otitis externa, right ear: Secondary | ICD-10-CM | POA: Diagnosis not present

## 2024-06-08 DIAGNOSIS — Z888 Allergy status to other drugs, medicaments and biological substances status: Secondary | ICD-10-CM | POA: Diagnosis not present

## 2024-06-08 DIAGNOSIS — Z885 Allergy status to narcotic agent status: Secondary | ICD-10-CM | POA: Diagnosis not present

## 2024-06-08 DIAGNOSIS — H60501 Unspecified acute noninfective otitis externa, right ear: Secondary | ICD-10-CM | POA: Diagnosis not present

## 2024-06-08 DIAGNOSIS — D72829 Elevated white blood cell count, unspecified: Secondary | ICD-10-CM | POA: Diagnosis present

## 2024-06-08 LAB — CBC WITH DIFFERENTIAL/PLATELET
Abs Immature Granulocytes: 0.04 K/uL (ref 0.00–0.07)
Basophils Absolute: 0 K/uL (ref 0.0–0.1)
Basophils Relative: 0 %
Eosinophils Absolute: 0 K/uL (ref 0.0–0.5)
Eosinophils Relative: 0 %
HCT: 32 % — ABNORMAL LOW (ref 36.0–46.0)
Hemoglobin: 10.8 g/dL — ABNORMAL LOW (ref 12.0–15.0)
Immature Granulocytes: 0 %
Lymphocytes Relative: 14 %
Lymphs Abs: 1.6 K/uL (ref 0.7–4.0)
MCH: 31.9 pg (ref 26.0–34.0)
MCHC: 33.8 g/dL (ref 30.0–36.0)
MCV: 94.4 fL (ref 80.0–100.0)
Monocytes Absolute: 0.8 K/uL (ref 0.1–1.0)
Monocytes Relative: 7 %
Neutro Abs: 8.9 K/uL — ABNORMAL HIGH (ref 1.7–7.7)
Neutrophils Relative %: 79 %
Platelets: 155 K/uL (ref 150–400)
RBC: 3.39 MIL/uL — ABNORMAL LOW (ref 3.87–5.11)
RDW: 12.6 % (ref 11.5–15.5)
WBC: 11.4 K/uL — ABNORMAL HIGH (ref 4.0–10.5)
nRBC: 0 % (ref 0.0–0.2)

## 2024-06-08 LAB — BASIC METABOLIC PANEL WITH GFR
Anion gap: 9 (ref 5–15)
BUN: 7 mg/dL (ref 6–20)
CO2: 23 mmol/L (ref 22–32)
Calcium: 7.9 mg/dL — ABNORMAL LOW (ref 8.9–10.3)
Chloride: 102 mmol/L (ref 98–111)
Creatinine, Ser: 0.93 mg/dL (ref 0.44–1.00)
GFR, Estimated: >60 mL/min (ref >60)
Glucose, Bld: 153 mg/dL — ABNORMAL HIGH (ref 70–99)
Potassium: 3.5 mmol/L (ref 3.5–5.1)
Sodium: 134 mmol/L — ABNORMAL LOW (ref 135–145)

## 2024-06-08 LAB — HIV ANTIBODY (ROUTINE TESTING W REFLEX): HIV Screen 4th Generation wRfx: NONREACTIVE

## 2024-06-08 LAB — MRSA NEXT GEN BY PCR, NASAL: MRSA by PCR Next Gen: NOT DETECTED

## 2024-06-08 MED ORDER — SPIRONOLACTONE 25 MG PO TABS: 100.0000 mg | ORAL_TABLET | Freq: Every day | ORAL | Status: DC

## 2024-06-08 MED ORDER — POLYETHYLENE GLYCOL 3350 17 G PO PACK
17.0000 g | PACK | Freq: Every day | ORAL | Status: DC
  Administered 2024-06-08 – 2024-06-09 (×2): 17 g via ORAL
  Filled 2024-06-08 (×2): qty 1

## 2024-06-08 MED ORDER — HYDROMORPHONE HCL 1 MG/ML IJ SOLN
0.5000 mg | Freq: Once | INTRAMUSCULAR | Status: AC
  Administered 2024-06-08: 0.5 mg via INTRAVENOUS
  Filled 2024-06-08: qty 0.5

## 2024-06-08 MED ORDER — ESCITALOPRAM OXALATE 20 MG PO TABS
20.0000 mg | ORAL_TABLET | Freq: Every day | ORAL | Status: DC
Start: 2024-06-08 — End: 2024-06-10
  Administered 2024-06-08 – 2024-06-10 (×3): 20 mg via ORAL
  Filled 2024-06-08 (×3): qty 1

## 2024-06-08 MED ORDER — ACETAMINOPHEN 325 MG PO TABS
650.0000 mg | ORAL_TABLET | Freq: Four times a day (QID) | ORAL | Status: DC | PRN
  Administered 2024-06-08 – 2024-06-10 (×6): 650 mg via ORAL
  Filled 2024-06-08 (×6): qty 2

## 2024-06-08 MED ORDER — CIPROFLOXACIN-DEXAMETHASONE 0.3-0.1 % OT SUSP
4.0000 [drp] | Freq: Two times a day (BID) | OTIC | Status: DC
  Administered 2024-06-08 – 2024-06-10 (×4): 4 [drp] via OTIC
  Filled 2024-06-08: qty 7.5

## 2024-06-08 MED ORDER — PIPERACILLIN-TAZOBACTAM 3.375 G IVPB
3.3750 g | Freq: Three times a day (TID) | INTRAVENOUS | Status: DC
  Administered 2024-06-08 – 2024-06-10 (×8): 3.375 g via INTRAVENOUS
  Filled 2024-06-08 (×8): qty 50

## 2024-06-08 MED ORDER — OXYCODONE HCL 5 MG PO TABS
5.0000 mg | ORAL_TABLET | ORAL | Status: DC | PRN
  Administered 2024-06-08 – 2024-06-09 (×5): 5 mg via ORAL
  Filled 2024-06-08 (×5): qty 1

## 2024-06-08 MED ORDER — LIDOCAINE 4 % EX CREA
TOPICAL_CREAM | Freq: Four times a day (QID) | CUTANEOUS | Status: DC | PRN
  Filled 2024-06-08: qty 5

## 2024-06-08 MED ORDER — KETOROLAC TROMETHAMINE 15 MG/ML IJ SOLN
15.0000 mg | Freq: Four times a day (QID) | INTRAMUSCULAR | Status: AC | PRN
  Administered 2024-06-08 – 2024-06-09 (×5): 15 mg via INTRAVENOUS
  Filled 2024-06-08 (×5): qty 1

## 2024-06-08 MED ORDER — HEPARIN SODIUM (PORCINE) 5000 UNIT/ML IJ SOLN
5000.0000 [IU] | Freq: Three times a day (TID) | INTRAMUSCULAR | Status: DC
  Administered 2024-06-08 – 2024-06-10 (×7): 5000 [IU] via SUBCUTANEOUS
  Filled 2024-06-08 (×7): qty 1

## 2024-06-08 MED ORDER — SPIRONOLACTONE 25 MG PO TABS
50.0000 mg | ORAL_TABLET | Freq: Every day | ORAL | Status: DC
  Administered 2024-06-08 – 2024-06-10 (×3): 50 mg via ORAL
  Filled 2024-06-08 (×3): qty 2

## 2024-06-08 MED ORDER — OXYCODONE HCL 5 MG/5ML PO SOLN
5.0000 mg | ORAL | Status: DC | PRN
  Administered 2024-06-08: 5 mg via ORAL
  Filled 2024-06-08: qty 5

## 2024-06-08 MED ORDER — LACTATED RINGERS IV SOLN: INTRAVENOUS | Status: AC

## 2024-06-08 MED ORDER — ACETAMINOPHEN 650 MG RE SUPP: 650.0000 mg | Freq: Four times a day (QID) | RECTAL | Status: DC | PRN

## 2024-06-08 NOTE — Progress Notes (Addendum)
 PROGRESS NOTE    Tracy Gallegos  FMW:[id number] DOB: Dec 01, 1985 DOA: 06/07/2024 PCP: Thedora Garnette HERO, MD   Brief Narrative:  Day zero note, for complete HPI please see admission note earlier this morning  [name] is a pleasant 38 year old female with history of recurrent otitis externa.  She follows with Dr. Karis ENT in the outpatient setting.  Appears to have had multiple infections now presenting with intractable pain and external erythema over her superficial lateral mandible and maxillary regions.  Imaging does not remark on any underlying fluid collection or abscess, notably in large lymph nodes in the area which may be contributing to her intractable pain.  Discussed case with Dr. Glenard Quiver who recommended initiating Ciprodex  eardrops and to continue antibiotic coverage -recommends Augmentin  at discharge.  Assessment & Plan:   Principal Problem:   Right otitis externa Active Problems:   ADHD   Hair loss   MDD (major depressive disorder), recurrent episode, moderate (HCC)   Prediabetes   Acute otitis externa   Facial cellulitis   Sepsis secondary to right otitis externa with concurrent cellulitis of the face  - Continue broad-spectrum antibiotics, Ciprodex  eardrops per ID, discharged on Augmentin  and continue eardrops -outpatient follow-up with Dr. Karis per their schedule. - Wean off IV narcotics, continue as needed IV Toradol  and Roxicodone  solution - ESR within normal limits, CRP minimally elevated, and consistent with temporal arteritis. - Given patient's numerous infections (typically upper respiratory/ENT) we discussed need for further outpatient workup for possible immunodeficiency although no notable family history per our discussion.  History of ADHD takes as needed Adderall. History depression on Lexapro . History of hair loss on spironolactone  and topical minoxidil. History of prediabetes/gestational diabetes repeat hemoglobin A1c pending: (Most  recent A1c September 2022): Lab Results  Component Value Date   HGBA1C 5.4 08/02/2021   DVT prophylaxis: heparin  injection 5,000 Units Start: 06/08/24 0800 Code Status:   Code Status: Full Code Family Communication: At bedside  Status is: Inpatient  Dispo: The patient is from: Home              Anticipated d/c is to: Home              Anticipated d/c date is: 24 to 48 hours              Patient currently not medically stable for discharge  Consultants:  Discussed case with ENT, no formal consult  Procedures:  None  Antimicrobials:  Zosyn   Subjective: No acute issues or events overnight, pain continues to be somewhat poorly controlled, otherwise denies nausea vomiting diarrhea constipation headaches fevers chills or chest pain.  Objective: Vitals:   06/08/24 0410 06/08/24 0717 06/08/24 1001 06/08/24 1409  BP: 103/62  (!) 101/59 99/62  Pulse: 100  93 87  Resp: 17  16 16   Temp: 100.1 F (37.8 C) (!) 102.3 F (39.1 C) 98.7 F (37.1 C) 98.4 F (36.9 C)  TempSrc:  Oral    SpO2: 99%  96% 99%  Weight:      Height:        Intake/Output Summary (Last 24 hours) at 06/08/2024 1445 Last data filed at 06/08/2024 1301 Gross per 24 hour  Intake 2018.64 ml  Output --  Net 2018.64 ml   Filed Weights   06/07/24 2309  Weight: 61.7 kg    Examination:  General: Resting in bed uncomfortably, no acute distress HEENT: Notable blanching erythema at the anterior inferior portion of her right ear, she did not tolerate  manipulation of the ear, ear canal exam deferred. Lungs:  Clear to auscultate bilaterally without rhonchi, wheeze, or rales. Heart:  Regular rate and rhythm.  Without murmurs, rubs, or gallops. Abdomen:  Soft, nontender, nondistended.  Without guarding or rebound. Extremities: Without cyanosis, clubbing, edema, or obvious deformity.  Data Reviewed: I have personally reviewed following labs and imaging studies  CBC: Recent Labs  Lab 06/07/24 1842 06/08/24 0326   WBC 13.6* 11.4*  NEUTROABS 11.9* 8.9*  HGB 12.6 10.8*  HCT 36.7 32.0*  MCV 92.2 94.4  PLT 191 155   Basic Metabolic Panel: Recent Labs  Lab 06/07/24 1842 06/08/24 0326  NA 136 134*  K 3.6 3.5  CL 101 102  CO2 25 23  GLUCOSE 183* 153*  BUN 9 7  CREATININE 0.88 0.93  CALCIUM  9.1 7.9*   GFR: Estimated Creatinine Clearance: 80.7 mL/min (by C-G formula based on SCr of 0.93 mg/dL). Liver Function Tests: Recent Labs  Lab 06/07/24 1842  AST 16  ALT 10  ALKPHOS 57  BILITOT 1.6*  PROT 6.9  ALBUMIN 4.3   No results for input(s): LIPASE, AMYLASE in the last 168 hours. No results for input(s): AMMONIA in the last 168 hours. Coagulation Profile: No results for input(s): INR, PROTIME in the last 168 hours. Cardiac Enzymes: No results for input(s): CKTOTAL, CKMB, CKMBINDEX, TROPONINI in the last 168 hours. BNP (last 3 results) No results for input(s): PROBNP in the last 8760 hours. HbA1C: No results for input(s): HGBA1C in the last 72 hours. CBG: No results for input(s): GLUCAP in the last 168 hours. Lipid Profile: No results for input(s): CHOL, HDL, LDLCALC, TRIG, CHOLHDL, LDLDIRECT in the last 72 hours. Thyroid  Function Tests: No results for input(s): TSH, T4TOTAL, FREET4, T3FREE, THYROIDAB in the last 72 hours. Anemia Panel: No results for input(s): VITAMINB12, FOLATE, FERRITIN, TIBC, IRON, RETICCTPCT in the last 72 hours. Sepsis Labs: Recent Labs  Lab 06/07/24 1842  LATICACIDVEN 1.5    Recent Results (from the past 240 hours)  Blood culture (routine x 2)     Status: None (Preliminary result)   Collection Time: 06/07/24  6:12 PM   Specimen: BLOOD  Result Value Ref Range Status   Specimen Description   Final    BLOOD RIGHT ANTECUBITAL Performed at [facility], [address] [place], [place] [address]    Special Requests   Final    Blood Culture adequate volume BOTTLES DRAWN  AEROBIC AND ANAEROBIC Performed at [facility], [address], [place], [place] [address]    Culture   Final    NO GROWTH < 12 HOURS Performed at [facility], [address] [place], [place] [address]    Report Status PENDING  Incomplete  Blood culture (routine x 2)     Status: None (Preliminary result)   Collection Time: 06/07/24  6:17 PM   Specimen: BLOOD  Result Value Ref Range Status   Specimen Description   Final    BLOOD LEFT ANTECUBITAL Performed at [facility], [address] [place], [place] [address]    Special Requests   Final    Blood Culture adequate volume BOTTLES DRAWN AEROBIC AND ANAEROBIC Performed at [facility], [address] [place], [place] [address]    Culture   Final    NO GROWTH < 12 HOURS Performed at [facility], [address] [place], [place] [address]    Report Status PENDING  Incomplete  MRSA Next Gen by  PCR, Nasal     Status: None   Collection Time: 06/08/24 11:36 AM   Specimen: Nasal Mucosa; Nasal Swab  Result Value Ref Range Status   MRSA by PCR Next Gen NOT DETECTED NOT DETECTED Final    Comment: (NOTE) The GeneXpert MRSA Assay (FDA approved for NASAL specimens only), is one component of a comprehensive MRSA colonization surveillance program. It is not intended to diagnose MRSA infection nor to guide or monitor treatment for MRSA infections. Test performance is not FDA approved in patients less than 67 years old. Performed at [facility], [address] [place], [place] [address]          Radiology Studies: CT Soft Tissue Neck W Contrast Result Date: 06/07/2024 CLINICAL DATA:  Soft tissue infection suspected. Right otitis externa progressive swelling despite antibiotics. EXAM: CT NECK WITH CONTRAST TECHNIQUE: Multidetector CT imaging of the neck was performed using the standard protocol following the bolus administration of  intravenous contrast. RADIATION DOSE REDUCTION: This exam was performed according to the departmental dose-optimization program which includes automated exposure control, adjustment of the mA and/or kV according to patient size and/or use of iterative reconstruction technique. CONTRAST:  75mL OMNIPAQUE  IOHEXOL  300 MG/ML  SOLN COMPARISON:  None Available. FINDINGS: There is significant soft tissue swelling along the right external auditory canal compatible with otitis externa. Swelling primarily involves the cartilaginous portion of the canal. There is additional abnormal soft tissue within the superior aspect of the osseous external auditory canal. There is mild narrowing of the right external auditory canal. Stranding within the surrounding subcutaneous tissues of the right ear extending inferiorly into the subcutaneous tissues adjacent to the posterior and superficial aspects of the right parotid gland. No focal fluid collection to suggest abscess formation. The right parotid gland is asymmetrically enlarged with mild enhancement likely reflecting reactive inflammatory changes. There is no evidence of soft tissue swelling within deeper spaces of the neck. Pharynx and larynx: The nasopharynx is symmetric. Symmetric appearance of the palatine tonsils. The oral cavity, floor of mouth, and base of tongue are unremarkable. The epiglottis is unremarkable. Normal appearance of the retropharynx. Piriform sinuses and aryepiglottic folds are symmetric. Symmetric appearance of the vocal folds. Salivary glands: Right parotid gland changes as above. The left parotid gland and submandibular glands are unremarkable. No abnormal calcification. Thyroid : Normal. Lymph nodes: Right level 2A cervical node measuring up to 1.1 cm in short axis demonstrating mild enhancement, likely reactive in setting of infection. There are additional asymmetrically prominent subcentimeter lymph nodes in right level 1 B and level 2 which are also  likely reactive. Vascular: Negative. Limited intracranial: Limited visualization of intracranial structures without focal acute abnormality. Visualized orbits: Negative. Mastoids and visualized paranasal sinuses: Minimal mucosal thickening in the alveolar recess of the right maxillary sinus. No air-fluid levels. Mastoid air cells are clear. Skeleton: No acute or aggressive process. Upper chest: Negative. Other: None. IMPRESSION: Soft tissue swelling along the right external auditory canal compatible with a tightest externa. There is stranding within the adjacent subcutaneous tissues extending inferiorly over the parotid space compatible cellulitis. Asymmetric enlargement and enhancement of the right parotid gland likely reflecting reactive inflammatory changes. No fluid collection or evidence of abscess formation. No swelling within the deeper spaces of the neck. Prominent right-sided cervical nodes, likely reactive. Electronically Signed   By: Donnice Mania M.D.   On: 06/07/2024 20:37   CT Head Wo Contrast Result Date: 06/07/2024 CLINICAL DATA:  Headache EXAM: CT HEAD WITHOUT CONTRAST TECHNIQUE:  Contiguous axial images were obtained from the base of the skull through the vertex without intravenous contrast. RADIATION DOSE REDUCTION: This exam was performed according to the departmental dose-optimization program which includes automated exposure control, adjustment of the mA and/or kV according to patient size and/or use of iterative reconstruction technique. COMPARISON:  None Available. FINDINGS: Brain: No acute intracranial abnormality. Specifically, no hemorrhage, hydrocephalus, mass lesion, acute infarction, or significant intracranial injury. Vascular: No hyperdense vessel or unexpected calcification. Skull: No acute calvarial abnormality. Sinuses/Orbits: No acute findings Other: None IMPRESSION: Normal study. Electronically Signed   By: Franky Crease M.D.   On: 06/07/2024 20:23    Scheduled Meds:   ciprofloxacin -dexamethasone   4 drop Right EAR BID   escitalopram   20 mg Oral Daily   heparin   5,000 Units Subcutaneous Q8H   lidocaine   1 patch Transdermal Q24H   polyethylene glycol  17 g Oral Daily   spironolactone   50 mg Oral Daily   Continuous Infusions:  lactated ringers  75 mL/hr at 06/08/24 1301   piperacillin -tazobactam (ZOSYN )  IV 3.375 g (06/08/24 1121)     LOS: 0 days   Time spent:  Elsie JAYSON Montclair, DO [facility]  If 7PM-7AM, please contact night-coverage [email or web address]  06/08/2024, 2:45 PM

## 2024-06-08 NOTE — Progress Notes (Signed)
   06/08/24 1007  TOC Brief Assessment  Insurance and Status Reviewed  Patient has primary care physician Yes  Home environment has been reviewed home with spouse  Prior level of function: independent  Prior/Current Home Services No current home services  Social Drivers of Health Review SDOH reviewed no interventions necessary  Readmission risk has been reviewed Yes  Transition of care needs no transition of care needs at this time

## 2024-06-08 NOTE — H&P (Signed)
 History and Physical    Tracy Gallegos FMW:[id number] DOB: Jul 17, 1986 DOA: 06/07/2024  Patient coming from: Home.  Chief Complaint: Right ear pain.  HPI: Tracy Gallegos is a 38 y.o. female with history of depression, ADHD, gestational diabetes mellitus presents to the ER with complaints of severe pain around the right ear and face.  Gradually worsened over the last 48 hours.  Patient states she has had recurrent episodes of otitis externa at least 5 episodes in the last 2 years and follows with Dr. Karis ENT surgeon.  But this time the pain was more than usual spreading to her face.  Denies any difficulty opening or closing her mouth or speaking or difficulty breathing.  Had subjective feeling of fever chills.  ED Course: In the ER CT scan shows features concerning for otitis externa with cellulitis of the right side of the face.  Cultures obtained and started on empiric antibiotics.  ER physician discussed with on-call ENT surgeon.  ENT surgeon at this time feel there was no surgical indication.  Labs showed WBC of 13.6.  Glucose of 183.  CRP was 3 and sed rate was 5.  Review of Systems: As per HPI, rest all negative.   Past Medical History:  Diagnosis Date   Depression    Endometriosis    History of gestational diabetes 01/23/2018   Ovarian cyst    Bilateral    Past Surgical History:  Procedure Laterality Date   OVARIAN CYST SURGERY     WISDOM TOOTH EXTRACTION       reports that she has never smoked. She has never used smokeless tobacco. She reports that she does not drink alcohol and does not use drugs.  Allergies  Allergen Reactions   Morphine  Sulfate     Severe abd cramping    Benadryl  [Diphenhydramine ] Other (See Comments)    Cramping muscles.  Pt reports she CAN take this medication safely when needed.    Family History  Problem Relation Age of Onset   Obesity Mother    Depression Mother    Alcohol abuse Mother    Heart disease Father    Early death  Father    Obesity Brother    Alcohol abuse Maternal Uncle    Depression Maternal Uncle    Hypertension Maternal Grandmother    Depression Maternal Grandmother    Alcohol abuse Maternal Grandmother    Alzheimer's disease Maternal Grandmother    Depression Maternal Grandfather    Stroke Maternal Grandfather    Heart disease Maternal Grandfather    Suicidality Maternal Grandfather    Suicidality Paternal Grandmother    Depression Paternal Grandmother    Heart disease Paternal Grandfather    Early death Paternal Grandfather     Prior to Admission medications   Medication Sig Start Date End Date Taking? Authorizing Provider  amphetamine -dextroamphetamine  (ADDERALL) 10 MG tablet Take 1 tablet (10 mg total) by mouth daily with breakfast. 04/20/24   Thedora Garnette HERO, MD  calcium  carbonate (TUMS - DOSED IN MG ELEMENTAL CALCIUM ) 500 MG chewable tablet Chew 1 tablet by mouth daily.    [provider]  cefdinir  (OMNICEF ) 300 MG capsule Take 1 capsule (300 mg total) by mouth 2 (two) times daily. 06/07/24   McElwee, Lauren A, NP  cetirizine (ZYRTEC) 10 MG tablet Take 10 mg by mouth daily. Daily as needed    [provider]  ciprofloxacin -dexamethasone  (CIPRODEX ) OTIC suspension Place 4 drops into the right ear 2 (two) times daily for 7 days.  04/08/24   Vivienne Delon HERO, PA-C  clindamycin  (CLEOCIN ) 300 MG capsule Take 1 capsule (300 mg total) by mouth 3 (three) times daily. 06/07/24   McElwee, Lauren A, NP  clindamycin  (CLINDAGEL ) 1 % gel Apply topically 2 (two) times daily. 03/19/23   Moishe Chiquita HERO, NP  cyclobenzaprine  (FLEXERIL ) 5 MG tablet Take 1 tablet (5 mg total) by mouth 3 (three) times daily as needed for muscle spasms. 11/19/23   Kennyth Domino, FNP  dicyclomine  (BENTYL ) 20 MG tablet Take 0.5 tablets (10 mg total) by mouth 3 (three) times daily for abdominal pain. 01/20/23     escitalopram  (LEXAPRO ) 20 MG tablet Take 1 tablet (20 mg total) by mouth daily. 08/29/23   Thedora Garnette HERO, MD  fexofenadine  (ALLEGRA  ALLERGY) 180 MG tablet Take 1 tablet (180 mg total) by mouth daily. 03/23/23   Lavell Bari LABOR, FNP  ketoconazole  (NIZORAL ) 2 % shampoo Apply to scalp and shampoo as usual. Repeat weekly 06/12/23     ketorolac  (TORADOL ) 10 MG tablet Take 1 tablet (10 mg total) by mouth every 6 (six) hours as needed. 06/07/24   McElwee, Tinnie LABOR, NP  levonorgestrel  (MIRENA) 20 MCG/DAY IUD 1 each by Intrauterine route once.    [provider]  melatonin 3 MG TABS tablet Take 3 mg by mouth at bedtime.    [provider]  mirabegron  ER (MYRBETRIQ ) 25 MG TB24 tablet Take 1 tablet (25 mg total) by mouth daily. 08/21/22     mometasone  (ELOCON ) 0.1 % cream Apply topically daily as needed for itching. 12/31/22   Karis Clunes, MD  Multiple Vitamin (MULTIVITAMIN) tablet Take 1 tablet by mouth daily.    [provider]  naproxen  (NAPROSYN ) 500 MG tablet Take 1 tablet (500 mg total) by mouth as needed. Takes as needed only 01/21/23   Thedora Garnette HERO, MD  ondansetron  (ZOFRAN ) 4 MG tablet Take 1 tablet (4 mg total) by mouth every 8 (eight) hours as needed. 06/07/24   McElwee, Lauren A, NP  prednisoLONE  acetate (PRED FORTE ) 1 % ophthalmic suspension Place 4 drops into the right ear as needed for itching. 08/16/22   Karis Clunes, MD  spironolactone  (ALDACTONE ) 100 MG tablet Take 1 tablet (100 mg total) by mouth daily. 01/16/24 06/07/24  Thedora Garnette HERO, MD  traZODone  (DESYREL ) 50 MG tablet Take 1/2 tablet (25 mg total) by mouth at bedtime as needed for sleep. 11/26/23   Dohmeier, Dedra, MD  tretinoin  (RETIN-A ) 0.01 % gel Apply topically at bedtime. 03/19/23   Moishe Chiquita HERO, NP  verapamil  (CALAN -SR) 120 MG CR tablet Take 1 tablet (120 mg total) by mouth at bedtime. 05/14/23   Dohmeier, Dedra, MD    Physical Exam: Constitutional: Moderately built and nourished. Vitals:   06/07/24 2200 06/07/24 2215 06/07/24 2230 06/07/24 2309  BP: 102/62 (!) 100/59 (!) 95/56 104/63  Pulse: (!) 113 (!) 111 (!)  110 (!) 102  Resp:  20  14  Temp:    97.7 F (36.5 C)  TempSrc:    Axillary  SpO2: 97% 97% 98% 97%  Weight:    61.7 kg  Height:    5' 8 (1.727 m)   Eyes: Anicteric no pallor. ENMT: No obvious discharge of the right ear.  Tender to touch the pinna and the mastoid area. Neck: No mass palpated on neck rigidity. Respiratory: No rhonchi or crepitations. Cardiovascular: S1-S2 heard. Abdomen: Soft nontender bowel sound present. Musculoskeletal: No edema. Skin: Erythema around the right auricle and side of the  face.  Tender to touch. Neurologic: Alert awake oriented to time place and person.  Moves all extremities. Psychiatric: Appears normal.  Normal affect.   Labs on Admission: I have personally reviewed following labs and imaging studies  CBC: Recent Labs  Lab 06/07/24 1842  WBC 13.6*  NEUTROABS 11.9*  HGB 12.6  HCT 36.7  MCV 92.2  PLT 191   Basic Metabolic Panel: Recent Labs  Lab 06/07/24 1842  NA 136  K 3.6  CL 101  CO2 25  GLUCOSE 183*  BUN 9  CREATININE 0.88  CALCIUM  9.1   GFR: Estimated Creatinine Clearance: 85.3 mL/min (by C-G formula based on SCr of 0.88 mg/dL). Liver Function Tests: Recent Labs  Lab 06/07/24 1842  AST 16  ALT 10  ALKPHOS 57  BILITOT 1.6*  PROT 6.9  ALBUMIN 4.3   No results for input(s): LIPASE, AMYLASE in the last 168 hours. No results for input(s): AMMONIA in the last 168 hours. Coagulation Profile: No results for input(s): INR, PROTIME in the last 168 hours. Cardiac Enzymes: No results for input(s): CKTOTAL, CKMB, CKMBINDEX, TROPONINI in the last 168 hours. BNP (last 3 results) No results for input(s): PROBNP in the last 8760 hours. HbA1C: No results for input(s): HGBA1C in the last 72 hours. CBG: No results for input(s): GLUCAP in the last 168 hours. Lipid Profile: No results for input(s): CHOL, HDL, LDLCALC, TRIG, CHOLHDL, LDLDIRECT in the last 72 hours. Thyroid  Function Tests: No  results for input(s): TSH, T4TOTAL, FREET4, T3FREE, THYROIDAB in the last 72 hours. Anemia Panel: No results for input(s): VITAMINB12, FOLATE, FERRITIN, TIBC, IRON, RETICCTPCT in the last 72 hours. Urine analysis:    Component Value Date/Time   COLORURINE COLORLESS (A) 04/24/2023 1418   APPEARANCEUR CLEAR 04/24/2023 1418   LABSPEC 1.002 (L) 04/24/2023 1418   PHURINE 8.0 04/24/2023 1418   GLUCOSEU NEGATIVE 04/24/2023 1418   HGBUR SMALL (A) 04/24/2023 1418   BILIRUBINUR NEGATIVE 04/24/2023 1418   KETONESUR 20 (A) 04/24/2023 1418   PROTEINUR NEGATIVE 04/24/2023 1418   UROBILINOGEN 0.2 11/05/2017 1656   NITRITE NEGATIVE 04/24/2023 1418   LEUKOCYTESUR NEGATIVE 04/24/2023 1418   Sepsis Labs: @LABRCNTIP (procalcitonin:4,lacticidven:4) )No results found for this or any previous visit (from the past 240 hours).   Radiological Exams on Admission: CT Soft Tissue Neck W Contrast Result Date: 06/07/2024 CLINICAL DATA:  Soft tissue infection suspected. Right otitis externa progressive swelling despite antibiotics. EXAM: CT NECK WITH CONTRAST TECHNIQUE: Multidetector CT imaging of the neck was performed using the standard protocol following the bolus administration of intravenous contrast. RADIATION DOSE REDUCTION: This exam was performed according to the departmental dose-optimization program which includes automated exposure control, adjustment of the mA and/or kV according to patient size and/or use of iterative reconstruction technique. CONTRAST:  75mL OMNIPAQUE  IOHEXOL  300 MG/ML  SOLN COMPARISON:  None Available. FINDINGS: There is significant soft tissue swelling along the right external auditory canal compatible with otitis externa. Swelling primarily involves the cartilaginous portion of the canal. There is additional abnormal soft tissue within the superior aspect of the osseous external auditory canal. There is mild narrowing of the right external auditory canal. Stranding  within the surrounding subcutaneous tissues of the right ear extending inferiorly into the subcutaneous tissues adjacent to the posterior and superficial aspects of the right parotid gland. No focal fluid collection to suggest abscess formation. The right parotid gland is asymmetrically enlarged with mild enhancement likely reflecting reactive inflammatory changes. There is no evidence of soft tissue swelling within deeper  spaces of the neck. Pharynx and larynx: The nasopharynx is symmetric. Symmetric appearance of the palatine tonsils. The oral cavity, floor of mouth, and base of tongue are unremarkable. The epiglottis is unremarkable. Normal appearance of the retropharynx. Piriform sinuses and aryepiglottic folds are symmetric. Symmetric appearance of the vocal folds. Salivary glands: Right parotid gland changes as above. The left parotid gland and submandibular glands are unremarkable. No abnormal calcification. Thyroid : Normal. Lymph nodes: Right level 2A cervical node measuring up to 1.1 cm in short axis demonstrating mild enhancement, likely reactive in setting of infection. There are additional asymmetrically prominent subcentimeter lymph nodes in right level 1 B and level 2 which are also likely reactive. Vascular: Negative. Limited intracranial: Limited visualization of intracranial structures without focal acute abnormality. Visualized orbits: Negative. Mastoids and visualized paranasal sinuses: Minimal mucosal thickening in the alveolar recess of the right maxillary sinus. No air-fluid levels. Mastoid air cells are clear. Skeleton: No acute or aggressive process. Upper chest: Negative. Other: None. IMPRESSION: Soft tissue swelling along the right external auditory canal compatible with a tightest externa. There is stranding within the adjacent subcutaneous tissues extending inferiorly over the parotid space compatible cellulitis. Asymmetric enlargement and enhancement of the right parotid gland likely  reflecting reactive inflammatory changes. No fluid collection or evidence of abscess formation. No swelling within the deeper spaces of the neck. Prominent right-sided cervical nodes, likely reactive. Electronically Signed   By: Donnice Mania M.D.   On: 06/07/2024 20:37   CT Head Wo Contrast Result Date: 06/07/2024 CLINICAL DATA:  Headache EXAM: CT HEAD WITHOUT CONTRAST TECHNIQUE: Contiguous axial images were obtained from the base of the skull through the vertex without intravenous contrast. RADIATION DOSE REDUCTION: This exam was performed according to the departmental dose-optimization program which includes automated exposure control, adjustment of the mA and/or kV according to patient size and/or use of iterative reconstruction technique. COMPARISON:  None Available. FINDINGS: Brain: No acute intracranial abnormality. Specifically, no hemorrhage, hydrocephalus, mass lesion, acute infarction, or significant intracranial injury. Vascular: No hyperdense vessel or unexpected calcification. Skull: No acute calvarial abnormality. Sinuses/Orbits: No acute findings Other: None IMPRESSION: Normal study. Electronically Signed   By: Franky Crease M.D.   On: 06/07/2024 20:23     Assessment/Plan Principal Problem:   Right otitis externa Active Problems:   Hair loss   MDD (major depressive disorder), recurrent episode, moderate (HCC)   Prediabetes   Acute otitis externa    Right otitis externa with cellulitis of the face severe -     will keep patient on empiric antibiotics and Toradol  for pain relief and IV fluids.  Patient follows with Dr. Karis ENT surgeon. History of ADHD takes as needed Adderall. History depression on Lexapro . History of hair loss on spironolactone  and topical minoxidil. History of prediabetes check hemoglobin A1c.  Since patient has severe right otitis externa with cellulitis will need close monitoring further workup and more than 2 midnight stay.   DVT prophylaxis:  Heparin . Code Status: Full code. Family Communication: Discussed with patient. Disposition Plan: Medical floor. Consults called: ER physician discussed with the ENT surgeon on-call. Admission status: The patient.

## 2024-06-08 NOTE — Plan of Care (Signed)
 Patient and family progressing in inpatient education

## 2024-06-09 DIAGNOSIS — H60311 Diffuse otitis externa, right ear: Secondary | ICD-10-CM

## 2024-06-09 DIAGNOSIS — L03211 Cellulitis of face: Secondary | ICD-10-CM | POA: Diagnosis not present

## 2024-06-09 DIAGNOSIS — H60501 Unspecified acute noninfective otitis externa, right ear: Secondary | ICD-10-CM | POA: Diagnosis not present

## 2024-06-09 LAB — BASIC METABOLIC PANEL WITH GFR
Anion gap: 5 (ref 5–15)
BUN: 6 mg/dL (ref 6–20)
CO2: 26 mmol/L (ref 22–32)
Calcium: 8.1 mg/dL — ABNORMAL LOW (ref 8.9–10.3)
Chloride: 104 mmol/L (ref 98–111)
Creatinine, Ser: 0.73 mg/dL (ref 0.44–1.00)
GFR, Estimated: >60 mL/min (ref >60)
Glucose, Bld: 98 mg/dL (ref 70–99)
Potassium: 3.8 mmol/L (ref 3.5–5.1)
Sodium: 135 mmol/L (ref 135–145)

## 2024-06-09 LAB — CBC
HCT: 32.4 % — ABNORMAL LOW (ref 36.0–46.0)
Hemoglobin: 10.4 g/dL — ABNORMAL LOW (ref 12.0–15.0)
MCH: 30.4 pg (ref 26.0–34.0)
MCHC: 32.1 g/dL (ref 30.0–36.0)
MCV: 94.7 fL (ref 80.0–100.0)
Platelets: 140 K/uL — ABNORMAL LOW (ref 150–400)
RBC: 3.42 MIL/uL — ABNORMAL LOW (ref 3.87–5.11)
RDW: 12.7 % (ref 11.5–15.5)
WBC: 7.5 K/uL (ref 4.0–10.5)
nRBC: 0 % (ref 0.0–0.2)

## 2024-06-09 MED ORDER — OXYCODONE-ACETAMINOPHEN 5-325 MG PO TABS
1.0000 | ORAL_TABLET | ORAL | Status: DC | PRN
  Administered 2024-06-09: 2 via ORAL
  Administered 2024-06-10: 1 via ORAL
  Administered 2024-06-10 (×2): 2 via ORAL
  Filled 2024-06-09 (×2): qty 2
  Filled 2024-06-09: qty 1
  Filled 2024-06-09: qty 2

## 2024-06-09 NOTE — Progress Notes (Signed)
 PROGRESS NOTE    Tracy Gallegos  FMW:[id number] DOB: May 02, 1986 DOA: 06/07/2024 PCP: Thedora Garnette HERO, MD   Brief Narrative:  Tracy Gallegos is a pleasant 38 year old female with history of recurrent otitis externa.  She follows with Dr. Karis ENT in the outpatient setting.  Appears to have had multiple infections now presenting with intractable pain and external erythema over her superficial lateral mandible and maxillary regions. Imaging does not remark on any underlying fluid collection or abscess, notably in large lymph nodes in the area which may be contributing to her intractable pain.  Discussed case with on-call ENT who recommended starting Ciprodex  eardrops and to continue antibiotic coverage -recommends Augmentin  at discharge.  Assessment & Plan:   Principal Problem:   Right otitis externa Active Problems:   ADHD   Hair loss   MDD (major depressive disorder), recurrent episode, moderate (HCC)   Prediabetes   Acute otitis externa   Facial cellulitis  Sepsis secondary to right otitis externa with concurrent cellulitis of the right side of the face, POA: Patient started on Ciprodex  eardrops and Zosyn  IV antibiotics.  Still complains of significant/excruciating pain around the right ear extending to the right side of the face.  On my examination, I could not appreciate any erythema at all, I could not examine external canal since patient did not allow me due to excruciating pain.  Patient has been seen by patient's primary ENT Dr. Karis, who recommends continuing current management and discharging when she is better and follow-up with her the next day in the office.  Her leukocytosis resolved, her last temperature spike was 100.4 at 6 PM on 06/08/2024.  Patient CRP is slightly elevated and ESR is totally normal, totally inconsistent with temporal arteritis as mentioned in previous hospitalist note.  I do not suspect temporal arteritis.  Patient does not have any visual changes.  History  of ADHD: Takes as needed Adderall.  History of depression: Continue Lexapro .  DVT prophylaxis: heparin  injection 5,000 Units Start: 06/08/24 0800   Code Status: Full Code  Family Communication: Husband present at bedside.  Plan of care discussed with patient in length and he/she verbalized understanding and agreed with it.  Status is: Inpatient Remains inpatient appropriate because: Still with excruciating pain   Estimated body mass index is 20.68 kg/m as calculated from the following:   Height as of this encounter: 5' 8 (1.727 m).   Weight as of this encounter: 61.7 kg.    Nutritional Assessment: Body mass index is 20.68 kg/m.Tracy Gallegos Seen by dietician.  I agree with the assessment and plan as outlined below: Nutrition Status:        . Skin Assessment: I have examined the patient's skin and I agree with the wound assessment as performed by the wound care RN as outlined below:    Consultants:  ENT  Procedures:  None  Antimicrobials:  Anti-infectives (From admission, onward)    Start     Dose/Rate Route Frequency Ordered Stop   06/08/24 0200  piperacillin -tazobactam (ZOSYN ) IVPB 3.375 g        3.375 g 12.5 mL/hr over 240 Minutes Intravenous Every 8 hours 06/08/24 0110     06/07/24 2115  ciprofloxacin  (CIPRO ) IVPB 400 mg        400 mg 200 mL/hr over 60 Minutes Intravenous  Once 06/07/24 2112 06/08/24 0748   06/07/24 1830  piperacillin -tazobactam (ZOSYN ) IVPB 3.375 g        3.375 g 100 mL/hr over 30 Minutes Intravenous  Once  06/07/24 1825 06/07/24 1935         Subjective: Patient seen and examined, husband at bedside.  Patient complains of severe pain at the right ear, no better than yesterday.  Due to pain, she sometimes feels nauseous.  Only sticking to liquid diet.  Objective: Vitals:   06/08/24 1409 06/08/24 1827 06/08/24 2045 06/09/24 0659  BP: 99/62  108/68 117/71  Pulse: 87  87 68  Resp: 16  17 17   Temp: 98.4 F (36.9 C) (!) 100.4 F (38 C) 99.1 F  (37.3 C) 98.2 F (36.8 C)  TempSrc:  Oral  Oral  SpO2: 99%  93% 99%  Weight:      Height:        Intake/Output Summary (Last 24 hours) at 06/09/2024 1036 Last data filed at 06/09/2024 0600 Gross per 24 hour  Intake 1416.89 ml  Output --  Net 1416.89 ml   Filed Weights   06/07/24 2309  Weight: 61.7 kg    Examination:  General exam: Appears calm and comfortable  Respiratory system: Clear to auscultation. Respiratory effort normal. Neck and right ear: There is some edema in the external canal with mild erythema.  No erythema in the auricle or the face area. Cardiovascular system: S1 & S2 heard, RRR. No JVD, murmurs, rubs, gallops or clicks. No pedal edema. Gastrointestinal system: Abdomen is nondistended, soft and nontender. No organomegaly or masses felt. Normal bowel sounds heard. Central nervous system: Alert and oriented. No focal neurological deficits. Extremities: Symmetric 5 x 5 power. Skin: No rashes, lesions or ulcers Psychiatry: Judgement and insight appear normal. Mood & affect appropriate.    Data Reviewed: I have personally reviewed following labs and imaging studies  CBC: Recent Labs  Lab 06/07/24 1842 06/08/24 0326 06/09/24 0326  WBC 13.6* 11.4* 7.5  NEUTROABS 11.9* 8.9*  --   HGB 12.6 10.8* 10.4*  HCT 36.7 32.0* 32.4*  MCV 92.2 94.4 94.7  PLT 191 155 140*   Basic Metabolic Panel: Recent Labs  Lab 06/07/24 1842 06/08/24 0326 06/09/24 0326  NA 136 134* 135  K 3.6 3.5 3.8  CL 101 102 104  CO2 25 23 26   GLUCOSE 183* 153* 98  BUN 9 7 6   CREATININE 0.88 0.93 0.73  CALCIUM  9.1 7.9* 8.1*   GFR: Estimated Creatinine Clearance: 93.8 mL/min (by C-G formula based on SCr of 0.73 mg/dL). Liver Function Tests: Recent Labs  Lab 06/07/24 1842  AST 16  ALT 10  ALKPHOS 57  BILITOT 1.6*  PROT 6.9  ALBUMIN 4.3   No results for input(s): LIPASE, AMYLASE in the last 168 hours. No results for input(s): AMMONIA in the last 168 hours. Coagulation  Profile: No results for input(s): INR, PROTIME in the last 168 hours. Cardiac Enzymes: No results for input(s): CKTOTAL, CKMB, CKMBINDEX, TROPONINI in the last 168 hours. BNP (last 3 results) No results for input(s): PROBNP in the last 8760 hours. HbA1C: No results for input(s): HGBA1C in the last 72 hours. CBG: No results for input(s): GLUCAP in the last 168 hours. Lipid Profile: No results for input(s): CHOL, HDL, LDLCALC, TRIG, CHOLHDL, LDLDIRECT in the last 72 hours. Thyroid  Function Tests: No results for input(s): TSH, T4TOTAL, FREET4, T3FREE, THYROIDAB in the last 72 hours. Anemia Panel: No results for input(s): VITAMINB12, FOLATE, FERRITIN, TIBC, IRON, RETICCTPCT in the last 72 hours. Sepsis Labs: Recent Labs  Lab 06/07/24 1842  LATICACIDVEN 1.5    Recent Results (from the past 240 hours)  Blood culture (routine x  2)     Status: None (Preliminary result)   Collection Time: 06/07/24  6:12 PM   Specimen: BLOOD  Result Value Ref Range Status   Specimen Description   Final    BLOOD RIGHT ANTECUBITAL Performed at [facility], [address], [place], [place] [address]    Special Requests   Final    Blood Culture adequate volume BOTTLES DRAWN AEROBIC AND ANAEROBIC Performed at [facility], [address], [place], [place] [address]    Culture   Final    NO GROWTH < 12 HOURS Performed at [facility], [address], [place], [place] [address]    Report Status PENDING  Incomplete  Blood culture (routine x 2)     Status: None (Preliminary result)   Collection Time: 06/07/24  6:17 PM   Specimen: BLOOD  Result Value Ref Range Status   Specimen Description   Final    BLOOD LEFT ANTECUBITAL Performed at [facility], [address], [place], [place] [address]    Special Requests   Final    Blood Culture adequate volume BOTTLES DRAWN AEROBIC AND  ANAEROBIC Performed at [facility], [address] [place], [place] [address]    Culture   Final    NO GROWTH < 12 HOURS Performed at [facility], [address] [place], [place] [address]    Report Status PENDING  Incomplete  MRSA Next Gen by PCR, Nasal     Status: None   Collection Time: 06/08/24 11:36 AM   Specimen: Nasal Mucosa; Nasal Swab  Result Value Ref Range Status   MRSA by PCR Next Gen NOT DETECTED NOT DETECTED Final    Comment: (NOTE) The GeneXpert MRSA Assay (FDA approved for NASAL specimens only), is one component of a comprehensive MRSA colonization surveillance program. It is not intended to diagnose MRSA infection nor to guide or monitor treatment for MRSA infections. Test performance is not FDA approved in patients less than 67 years old. Performed at [facility], [address], [place], [place] [address]      Radiology Studies: CT Soft Tissue Neck W Contrast Result Date: 06/07/2024 CLINICAL DATA:  Soft tissue infection suspected. Right otitis externa progressive swelling despite antibiotics. EXAM: CT NECK WITH CONTRAST TECHNIQUE: Multidetector CT imaging of the neck was performed using the standard protocol following the bolus administration of intravenous contrast. RADIATION DOSE REDUCTION: This exam was performed according to the departmental dose-optimization program which includes automated exposure control, adjustment of the mA and/or kV according to patient size and/or use of iterative reconstruction technique. CONTRAST:  75mL OMNIPAQUE  IOHEXOL  300 MG/ML  SOLN COMPARISON:  None Available. FINDINGS: There is significant soft tissue swelling along the right external auditory canal compatible with otitis externa. Swelling primarily involves the cartilaginous portion of the canal. There is additional abnormal soft tissue within the superior aspect of the osseous external auditory canal. There is mild narrowing of the  right external auditory canal. Stranding within the surrounding subcutaneous tissues of the right ear extending inferiorly into the subcutaneous tissues adjacent to the posterior and superficial aspects of the right parotid gland. No focal fluid collection to suggest abscess formation. The right parotid gland is asymmetrically enlarged with mild enhancement likely reflecting reactive inflammatory changes. There is no evidence of soft tissue swelling within deeper spaces of the neck. Pharynx and larynx: The nasopharynx is symmetric. Symmetric appearance of the palatine tonsils. The oral cavity, floor of mouth, and base  of tongue are unremarkable. The epiglottis is unremarkable. Normal appearance of the retropharynx. Piriform sinuses and aryepiglottic folds are symmetric. Symmetric appearance of the vocal folds. Salivary glands: Right parotid gland changes as above. The left parotid gland and submandibular glands are unremarkable. No abnormal calcification. Thyroid : Normal. Lymph nodes: Right level 2A cervical node measuring up to 1.1 cm in short axis demonstrating mild enhancement, likely reactive in setting of infection. There are additional asymmetrically prominent subcentimeter lymph nodes in right level 1 B and level 2 which are also likely reactive. Vascular: Negative. Limited intracranial: Limited visualization of intracranial structures without focal acute abnormality. Visualized orbits: Negative. Mastoids and visualized paranasal sinuses: Minimal mucosal thickening in the alveolar recess of the right maxillary sinus. No air-fluid levels. Mastoid air cells are clear. Skeleton: No acute or aggressive process. Upper chest: Negative. Other: None. IMPRESSION: Soft tissue swelling along the right external auditory canal compatible with a tightest externa. There is stranding within the adjacent subcutaneous tissues extending inferiorly over the parotid space compatible cellulitis. Asymmetric enlargement and  enhancement of the right parotid gland likely reflecting reactive inflammatory changes. No fluid collection or evidence of abscess formation. No swelling within the deeper spaces of the neck. Prominent right-sided cervical nodes, likely reactive. Electronically Signed   By: Donnice Mania M.D.   On: 06/07/2024 20:37   CT Head Wo Contrast Result Date: 06/07/2024 CLINICAL DATA:  Headache EXAM: CT HEAD WITHOUT CONTRAST TECHNIQUE: Contiguous axial images were obtained from the base of the skull through the vertex without intravenous contrast. RADIATION DOSE REDUCTION: This exam was performed according to the departmental dose-optimization program which includes automated exposure control, adjustment of the mA and/or kV according to patient size and/or use of iterative reconstruction technique. COMPARISON:  None Available. FINDINGS: Brain: No acute intracranial abnormality. Specifically, no hemorrhage, hydrocephalus, mass lesion, acute infarction, or significant intracranial injury. Vascular: No hyperdense vessel or unexpected calcification. Skull: No acute calvarial abnormality. Sinuses/Orbits: No acute findings Other: None IMPRESSION: Normal study. Electronically Signed   By: Franky Crease M.D.   On: 06/07/2024 20:23    Scheduled Meds:  ciprofloxacin -dexamethasone   4 drop Right EAR BID   escitalopram   20 mg Oral Daily   heparin   5,000 Units Subcutaneous Q8H   lidocaine   1 patch Transdermal Q24H   polyethylene glycol  17 g Oral Daily   spironolactone   50 mg Oral Daily   Continuous Infusions:  piperacillin -tazobactam (ZOSYN )  IV 3.375 g (06/09/24 0925)     LOS: 1 day   Fredia Skeeter, MD [facility]  06/09/2024, 10:36 AM   *Please note that this is a verbal dictation therefore any spelling or grammatical errors are due to the Dragon Medical One system interpretation.  Please page via Amion and do not message via secure chat for urgent patient care matters. Secure chat can be used for non  urgent patient care matters.  How to contact the [facility] Attending or Consulting provider 7A - 7P or covering provider during after hours 7P -7A, for this patient?  Check the care team in [facility] and look for a) attending/consulting [facility] provider listed and b) the [facility] team listed. Page or secure chat 7A-7P. Log into [email or web address] and use [facility] universal password to access. If you do not have the password, please contact the hospital operator. Locate the [facility] provider you are looking for under [facility] and page to a number that you can be directly reached. If you still have difficulty reaching the provider, please page the  DOC (Director on Call) for the Hospitalists listed on amion for assistance.

## 2024-06-09 NOTE — Consult Note (Addendum)
 Cc: Severe right otitis externa  HPI: Tracy Gallegos is a 38 y.o. female who was admitted from the emergency room yesterday for treatment of her severe right otitis externa.  The patient has a history of frequent recurrent otitis externa.  She was previously diagnosed with chronic eczematous otitis externa as well as recurrent acute infection.  She was treated with Ciprodex  eardrops and Elocon  cream.  According to the patient, she started experiencing severe right ear pain 2 days ago.  The pain spread to her right face, involving her jaw and her mouth.  She was admitted for IV antibiotics treatment.  Her CT scan did not show any drainable abscess.  Past Medical History:  Diagnosis Date   Depression    Endometriosis    History of gestational diabetes 01/23/2018   Ovarian cyst    Bilateral    Past Surgical History:  Procedure Laterality Date   OVARIAN CYST SURGERY     WISDOM TOOTH EXTRACTION      Family History  Problem Relation Age of Onset   Obesity Mother    Depression Mother    Alcohol abuse Mother    Heart disease Father    Early death Father    Obesity Brother    Alcohol abuse Maternal Uncle    Depression Maternal Uncle    Hypertension Maternal Grandmother    Depression Maternal Grandmother    Alcohol abuse Maternal Grandmother    Alzheimer's disease Maternal Grandmother    Depression Maternal Grandfather    Stroke Maternal Grandfather    Heart disease Maternal Grandfather    Suicidality Maternal Grandfather    Suicidality Paternal Grandmother    Depression Paternal Grandmother    Heart disease Paternal Grandfather    Early death Paternal Grandfather     Social History:  reports that she has never smoked. She has never used smokeless tobacco. She reports that she does not drink alcohol and does not use drugs.  Allergies:  Allergies  Allergen Reactions   Morphine  Sulfate     Severe abd cramping    Benadryl  [Diphenhydramine ] Other (See Comments)    Cramping  muscles.  Pt reports she CAN take this medication safely when needed.    Medications: I have reviewed the patient's current medications. Scheduled:  ciprofloxacin -dexamethasone   4 drop Right EAR BID   escitalopram   20 mg Oral Daily   heparin   5,000 Units Subcutaneous Q8H   lidocaine   1 patch Transdermal Q24H   polyethylene glycol  17 g Oral Daily   spironolactone   50 mg Oral Daily   Continuous:  piperacillin -tazobactam (ZOSYN )  IV 3.375 g (06/09/24 0925)   PRN:acetaminophen  **OR** acetaminophen , lidocaine , oxyCODONE   Results for orders placed or performed during the hospital encounter of 06/07/24 (from the past 48 hours)  Blood culture (routine x 2)     Status: None (Preliminary result)   Collection Time: 06/07/24  6:12 PM   Specimen: BLOOD  Result Value Ref Range   Specimen Description      BLOOD RIGHT ANTECUBITAL Performed at [facility], [address], [place], [place] [address]    Special Requests      Blood Culture adequate volume BOTTLES DRAWN AEROBIC AND ANAEROBIC Performed at [facility], [address], [place], [place] [address]    Culture      NO GROWTH < 12 HOURS Performed at [facility], [address], [place], [place] [address]    Report Status PENDING   Blood culture (routine x  2)     Status: None (Preliminary result)   Collection Time: 06/07/24  6:17 PM   Specimen: BLOOD  Result Value Ref Range   Specimen Description      BLOOD LEFT ANTECUBITAL Performed at [facility], [address] [place], [place] [address]    Special Requests      Blood Culture adequate volume BOTTLES DRAWN AEROBIC AND ANAEROBIC Performed at [facility], [address] [place], [place] [address]    Culture      NO GROWTH < 12 HOURS Performed at [facility], [address], [place], [place] [address]    Report Status PENDING   Lactic acid, plasma     Status: None   Collection  Time: 06/07/24  6:42 PM  Result Value Ref Range   Lactic Acid, Venous 1.5 0.5 - 1.9 mmol/L    Comment: Performed at [facility], [address], [place], [place] [address]  CBC with Differential     Status: Abnormal   Collection Time: 06/07/24  6:42 PM  Result Value Ref Range   WBC 13.6 (H) 4.0 - 10.5 K/uL   RBC 3.98 3.87 - 5.11 MIL/uL   Hemoglobin 12.6 12.0 - 15.0 g/dL   HCT 63.2 63.9 - 53.9 %   MCV 92.2 80.0 - 100.0 fL   MCH 31.7 26.0 - 34.0 pg   MCHC 34.3 30.0 - 36.0 g/dL   RDW 87.4 88.4 - 84.4 %   Platelets 191 150 - 400 K/uL   nRBC 0.0 0.0 - 0.2 %   Neutrophils Relative % 88 %   Neutro Abs 11.9 (H) 1.7 - 7.7 K/uL   Lymphocytes Relative 7 %   Lymphs Abs 0.9 0.7 - 4.0 K/uL   Monocytes Relative 5 %   Monocytes Absolute 0.7 0.1 - 1.0 K/uL   Eosinophils Relative 0 %   Eosinophils Absolute 0.0 0.0 - 0.5 K/uL   Basophils Relative 0 %   Basophils Absolute 0.0 0.0 - 0.1 K/uL   Immature Granulocytes 0 %   Abs Immature Granulocytes 0.05 0.00 - 0.07 K/uL    Comment: Performed at [facility], [address] [place], [place] [address]  Comprehensive metabolic panel     Status: Abnormal   Collection Time: 06/07/24  6:42 PM  Result Value Ref Range   Sodium 136 135 - 145 mmol/L   Potassium 3.6 3.5 - 5.1 mmol/L   Chloride 101 98 - 111 mmol/L   CO2 25 22 - 32 mmol/L   Glucose, Bld 183 (H) 70 - 99 mg/dL    Comment: Glucose reference range applies only to samples taken after fasting for at least 8 hours.   BUN 9 6 - 20 mg/dL   Creatinine, Ser 9.11 0.44 - 1.00 mg/dL   Calcium  9.1 8.9 - 10.3 mg/dL   Total Protein 6.9 6.5 - 8.1 g/dL   Albumin 4.3 3.5 - 5.0 g/dL   AST 16 15 - 41 U/L   ALT 10 0 - 44 U/L   Alkaline Phosphatase 57 38 - 126 U/L   Total Bilirubin 1.6 (H) 0.0 - 1.2 mg/dL   GFR, Estimated >39 >39 mL/min    Comment: (NOTE) Calculated using the CKD-EPI Creatinine Equation (2021)    Anion gap 10 5 - 15    Comment: Performed at [facility], [address], [place], [place] [address]  Sedimentation rate     Status: None   Collection Time: 06/07/24  6:42  PM  Result Value Ref Range   Sed Rate 5 0 - 22 mm/hr    Comment: Performed at [facility], [address], [place], [place] [address]  C-reactive protein     Status: Abnormal   Collection Time: 06/07/24  6:42 PM  Result Value Ref Range   CRP 3.0 (H) <1.0 mg/dL    Comment: Performed at [facility], [address], [place], [place] [address]  hCG, serum, qualitative     Status: None   Collection Time: 06/07/24  6:42 PM  Result Value Ref Range   Preg, Serum NEGATIVE NEGATIVE    Comment:        THE SENSITIVITY OF THIS METHODOLOGY IS >10 mIU/mL. Performed at [facility], [address] [place], [place] [address]   HIV Antibody (routine testing w rflx)     Status: None   Collection Time: 06/08/24  3:26 AM  Result Value Ref Range   HIV Screen 4th Generation wRfx Non Reactive Non Reactive    Comment: Performed at [facility], [address], [place], [place] [address]  Basic metabolic panel     Status: Abnormal   Collection Time: 06/08/24  3:26 AM  Result Value Ref Range   Sodium 134 (L) 135 - 145 mmol/L   Potassium 3.5 3.5 - 5.1 mmol/L   Chloride 102 98 - 111 mmol/L   CO2 23 22 - 32 mmol/L   Glucose, Bld 153 (H) 70 - 99 mg/dL    Comment: Glucose reference range applies only to samples taken after fasting for at least 8 hours.   BUN 7 6 - 20 mg/dL   Creatinine, Ser 9.06 0.44 - 1.00 mg/dL   Calcium  7.9 (L) 8.9 - 10.3 mg/dL   GFR, Estimated >39 >39 mL/min    Comment: (NOTE) Calculated using the CKD-EPI Creatinine Equation (2021)    Anion gap 9 5 - 15    Comment: Performed at [facility], [address] [place], [place] [address]  CBC with Differential/Platelet     Status: Abnormal   Collection Time: 06/08/24  3:26 AM  Result Value Ref Range   WBC 11.4 (H) 4.0 - 10.5  K/uL   RBC 3.39 (L) 3.87 - 5.11 MIL/uL   Hemoglobin 10.8 (L) 12.0 - 15.0 g/dL   HCT 67.9 (L) 63.9 - 53.9 %   MCV 94.4 80.0 - 100.0 fL   MCH 31.9 26.0 - 34.0 pg   MCHC 33.8 30.0 - 36.0 g/dL   RDW 87.3 88.4 - 84.4 %   Platelets 155 150 - 400 K/uL   nRBC 0.0 0.0 - 0.2 %   Neutrophils Relative % 79 %   Neutro Abs 8.9 (H) 1.7 - 7.7 K/uL   Lymphocytes Relative 14 %   Lymphs Abs 1.6 0.7 - 4.0 K/uL   Monocytes Relative 7 %   Monocytes Absolute 0.8 0.1 - 1.0 K/uL   Eosinophils Relative 0 %   Eosinophils Absolute 0.0 0.0 - 0.5 K/uL   Basophils Relative 0 %   Basophils Absolute 0.0 0.0 - 0.1 K/uL   Immature Granulocytes 0 %   Abs Immature Granulocytes 0.04 0.00 - 0.07 K/uL    Comment: Performed at [facility], [address], [place], [place] [address]  MRSA Next Gen by PCR, Nasal     Status: None   Collection Time: 06/08/24 11:36 AM   Specimen: Nasal Mucosa; Nasal Swab  Result Value Ref Range  MRSA by PCR Next Gen NOT DETECTED NOT DETECTED    Comment: (NOTE) The GeneXpert MRSA Assay (FDA approved for NASAL specimens only), is one component of a comprehensive MRSA colonization surveillance program. It is not intended to diagnose MRSA infection nor to guide or monitor treatment for MRSA infections. Test performance is not FDA approved in patients less than 40 years old. Performed at [facility], [address], [place], [place] [address]   CBC     Status: Abnormal   Collection Time: 06/09/24  3:26 AM  Result Value Ref Range   WBC 7.5 4.0 - 10.5 K/uL   RBC 3.42 (L) 3.87 - 5.11 MIL/uL   Hemoglobin 10.4 (L) 12.0 - 15.0 g/dL   HCT 67.5 (L) 63.9 - 53.9 %   MCV 94.7 80.0 - 100.0 fL   MCH 30.4 26.0 - 34.0 pg   MCHC 32.1 30.0 - 36.0 g/dL   RDW 87.2 88.4 - 84.4 %   Platelets 140 (L) 150 - 400 K/uL   nRBC 0.0 0.0 - 0.2 %    Comment: Performed at [facility], [address], [place], [place] [address]  Basic metabolic panel with  GFR     Status: Abnormal   Collection Time: 06/09/24  3:26 AM  Result Value Ref Range   Sodium 135 135 - 145 mmol/L   Potassium 3.8 3.5 - 5.1 mmol/L   Chloride 104 98 - 111 mmol/L   CO2 26 22 - 32 mmol/L   Glucose, Bld 98 70 - 99 mg/dL    Comment: Glucose reference range applies only to samples taken after fasting for at least 8 hours.   BUN 6 6 - 20 mg/dL   Creatinine, Ser 9.26 0.44 - 1.00 mg/dL   Calcium  8.1 (L) 8.9 - 10.3 mg/dL   GFR, Estimated >39 >39 mL/min    Comment: (NOTE) Calculated using the CKD-EPI Creatinine Equation (2021)    Anion gap 5 5 - 15    Comment: Performed at [facility], [address] [place], [place] [address]    CT Soft Tissue Neck W Contrast Result Date: 06/07/2024 CLINICAL DATA:  Soft tissue infection suspected. Right otitis externa progressive swelling despite antibiotics. EXAM: CT NECK WITH CONTRAST TECHNIQUE: Multidetector CT imaging of the neck was performed using the standard protocol following the bolus administration of intravenous contrast. RADIATION DOSE REDUCTION: This exam was performed according to the departmental dose-optimization program which includes automated exposure control, adjustment of the mA and/or kV according to patient size and/or use of iterative reconstruction technique. CONTRAST:  75mL OMNIPAQUE  IOHEXOL  300 MG/ML  SOLN COMPARISON:  None Available. FINDINGS: There is significant soft tissue swelling along the right external auditory canal compatible with otitis externa. Swelling primarily involves the cartilaginous portion of the canal. There is additional abnormal soft tissue within the superior aspect of the osseous external auditory canal. There is mild narrowing of the right external auditory canal. Stranding within the surrounding subcutaneous tissues of the right ear extending inferiorly into the subcutaneous tissues adjacent to the posterior and superficial aspects of the right parotid gland. No focal fluid  collection to suggest abscess formation. The right parotid gland is asymmetrically enlarged with mild enhancement likely reflecting reactive inflammatory changes. There is no evidence of soft tissue swelling within deeper spaces of the neck. Pharynx and larynx: The nasopharynx is symmetric. Symmetric appearance of the palatine tonsils. The oral cavity, floor of mouth, and base of tongue are unremarkable. The epiglottis  is unremarkable. Normal appearance of the retropharynx. Piriform sinuses and aryepiglottic folds are symmetric. Symmetric appearance of the vocal folds. Salivary glands: Right parotid gland changes as above. The left parotid gland and submandibular glands are unremarkable. No abnormal calcification. Thyroid : Normal. Lymph nodes: Right level 2A cervical node measuring up to 1.1 cm in short axis demonstrating mild enhancement, likely reactive in setting of infection. There are additional asymmetrically prominent subcentimeter lymph nodes in right level 1 B and level 2 which are also likely reactive. Vascular: Negative. Limited intracranial: Limited visualization of intracranial structures without focal acute abnormality. Visualized orbits: Negative. Mastoids and visualized paranasal sinuses: Minimal mucosal thickening in the alveolar recess of the right maxillary sinus. No air-fluid levels. Mastoid air cells are clear. Skeleton: No acute or aggressive process. Upper chest: Negative. Other: None. IMPRESSION: Soft tissue swelling along the right external auditory canal compatible with a tightest externa. There is stranding within the adjacent subcutaneous tissues extending inferiorly over the parotid space compatible cellulitis. Asymmetric enlargement and enhancement of the right parotid gland likely reflecting reactive inflammatory changes. No fluid collection or evidence of abscess formation. No swelling within the deeper spaces of the neck. Prominent right-sided cervical nodes, likely reactive.  Electronically Signed   By: Donnice Mania M.D.   On: 06/07/2024 20:37   CT Head Wo Contrast Result Date: 06/07/2024 CLINICAL DATA:  Headache EXAM: CT HEAD WITHOUT CONTRAST TECHNIQUE: Contiguous axial images were obtained from the base of the skull through the vertex without intravenous contrast. RADIATION DOSE REDUCTION: This exam was performed according to the departmental dose-optimization program which includes automated exposure control, adjustment of the mA and/or kV according to patient size and/or use of iterative reconstruction technique. COMPARISON:  None Available. FINDINGS: Brain: No acute intracranial abnormality. Specifically, no hemorrhage, hydrocephalus, mass lesion, acute infarction, or significant intracranial injury. Vascular: No hyperdense vessel or unexpected calcification. Skull: No acute calvarial abnormality. Sinuses/Orbits: No acute findings Other: None IMPRESSION: Normal study. Electronically Signed   By: Franky Crease M.D.   On: 06/07/2024 20:23    Blood pressure 117/71, pulse 68, temperature 98.2 F (36.8 C), temperature source Oral, resp. rate 17, height 5' 8 (1.727 m), weight 61.7 kg, SpO2 99%. Eyes: Pupils are equal, round, reactive to light. Extraocular motion is intact.  Ears: Examination of the ears shows edematous and erythematous right ear canal.  The erythema and tenderness extends to the right face. Nose: Nasal examination shows normal mucosa, septum, turbinates.  Face: Facial examination shows no asymmetry. Palpation of the face elicit right facial tenderness. Mouth: Oral cavity examination shows no mucosal abnormality. Neck: Palpation of the neck reveals reactive lymphadenopathy.  The trachea is midline. The thyroid  is not significantly enlarged.  Neuro: Cranial nerves 2-12 are all grossly in tact.   Assessment/Plan: Acute right otitis externa, with right facial cellulitis.  She has clinically improved after she was treated with IV antibiotic.  No abscess  noted. - Consider discharge home on oral Augmentin  and Ciprodex  eardrops. - The patient may follow-up in my office the day after hospital discharge for ear canal debridement. - Pain medication as needed.  Darilyn Storbeck W Mercia Dowe 06/09/2024, 9:28 AM

## 2024-06-09 NOTE — Plan of Care (Signed)
   Problem: Education: Goal: Knowledge of General Education information will improve Description Including pain rating scale, medication(s)/side effects and non-pharmacologic comfort measures Outcome: Progressing   Problem: Health Behavior/Discharge Planning: Goal: Ability to manage health-related needs will improve Outcome: Progressing

## 2024-06-10 ENCOUNTER — Other Ambulatory Visit (HOSPITAL_COMMUNITY): Payer: Self-pay

## 2024-06-10 ENCOUNTER — Ambulatory Visit (INDEPENDENT_AMBULATORY_CARE_PROVIDER_SITE_OTHER): Admitting: Otolaryngology

## 2024-06-10 ENCOUNTER — Encounter (INDEPENDENT_AMBULATORY_CARE_PROVIDER_SITE_OTHER): Payer: Self-pay | Admitting: Otolaryngology

## 2024-06-10 ENCOUNTER — Other Ambulatory Visit (HOSPITAL_BASED_OUTPATIENT_CLINIC_OR_DEPARTMENT_OTHER): Payer: Self-pay

## 2024-06-10 VITALS — BP 99/67 | HR 74

## 2024-06-10 DIAGNOSIS — H60501 Unspecified acute noninfective otitis externa, right ear: Secondary | ICD-10-CM | POA: Diagnosis not present

## 2024-06-10 DIAGNOSIS — H6091 Unspecified otitis externa, right ear: Secondary | ICD-10-CM | POA: Diagnosis not present

## 2024-06-10 DIAGNOSIS — H60331 Swimmer's ear, right ear: Secondary | ICD-10-CM

## 2024-06-10 LAB — CBC
HCT: 33.5 % — ABNORMAL LOW (ref 36.0–46.0)
Hemoglobin: 10.9 g/dL — ABNORMAL LOW (ref 12.0–15.0)
MCH: 31 pg (ref 26.0–34.0)
MCHC: 32.5 g/dL (ref 30.0–36.0)
MCV: 95.2 fL (ref 80.0–100.0)
Platelets: 154 K/uL (ref 150–400)
RBC: 3.52 MIL/uL — ABNORMAL LOW (ref 3.87–5.11)
RDW: 12.8 % (ref 11.5–15.5)
WBC: 6.3 K/uL (ref 4.0–10.5)
nRBC: 0 % (ref 0.0–0.2)

## 2024-06-10 LAB — BASIC METABOLIC PANEL WITH GFR
Anion gap: 7 (ref 5–15)
BUN: 8 mg/dL (ref 6–20)
CO2: 27 mmol/L (ref 22–32)
Calcium: 8.4 mg/dL — ABNORMAL LOW (ref 8.9–10.3)
Chloride: 102 mmol/L (ref 98–111)
Creatinine, Ser: 0.93 mg/dL (ref 0.44–1.00)
GFR, Estimated: >60 mL/min (ref >60)
Glucose, Bld: 86 mg/dL (ref 70–99)
Potassium: 3.8 mmol/L (ref 3.5–5.1)
Sodium: 136 mmol/L (ref 135–145)

## 2024-06-10 MED ORDER — CIPROFLOXACIN-DEXAMETHASONE 0.3-0.1 % OT SUSP
4.0000 [drp] | Freq: Two times a day (BID) | OTIC | 0 refills | Status: AC
  Filled 2024-06-10: qty 7.5, 10d supply, fill #0

## 2024-06-10 MED ORDER — OXYCODONE-ACETAMINOPHEN 5-325 MG PO TABS
1.0000 | ORAL_TABLET | ORAL | 0 refills | Status: DC | PRN
  Filled 2024-06-10 (×2): qty 15, 2d supply, fill #0

## 2024-06-10 MED ORDER — AMOXICILLIN-POT CLAVULANATE 875-125 MG PO TABS
1.0000 | ORAL_TABLET | Freq: Two times a day (BID) | ORAL | 0 refills | Status: AC
  Filled 2024-06-10: qty 14, 7d supply, fill #0

## 2024-06-10 NOTE — Discharge Summary (Signed)
 Physician Discharge Summary  Tracy Gallegos FMW:[id number] DOB: 1986-03-17 DOA: 06/07/2024  PCP: Thedora Garnette HERO, MD  Admit date: 06/07/2024 Discharge date: 06/10/2024 30 Day Unplanned Readmission Risk Score    Flowsheet Row ED to Hosp-Admission (Current) from 06/07/2024 in [facility]  30 Day Unplanned Readmission Risk Score (%) 8.26 Filed at 06/10/2024 0801    This score is the patient's risk of an unplanned readmission within 30 days of being discharged (0 -100%). The score is based on dignosis, age, lab data, medications, orders, and past utilization.   Low:  0-14.9   Medium: 15-21.9   High: 22-29.9   Extreme: 30 and above          Admitted From: Home Disposition: Home  Recommendations for Outpatient Follow-up:  Follow up with PCP in 1-2 weeks Please obtain BMP/CBC in one week Follow-up with ENT Dr. Karis tomorrow Please follow up with your PCP on the following pending results: Unresulted Labs (From admission, onward)     Start     Ordered   06/09/24 0500  CBC  Daily,   R      06/08/24 1449   06/09/24 0500  Basic metabolic panel with GFR  Daily,   R      06/08/24 1449   06/08/24 0104  CBC  (heparin )  Once,   R       Comments: Baseline for heparin  therapy IF NOT ALREADY DRAWN.  Notify MD if PLT < 100 K.    06/08/24 0105              Home Health: None Equipment/Devices: None  Discharge Condition: Stable CODE STATUS: Full code Diet recommendation: Cardiac  Subjective: Seen and examined, son at the bedside.  Patient's right ear pain is improving.  No new complaint.  Patient is afebrile with leukocytosis improved.  Lengthy discussion with patient and her husband who is a physician himself, they are in agreement with going home.  Brief/Interim Summary: Tracy Gallegos is a pleasant 38 year old female with history of recurrent otitis externa.  She follows with Dr. Karis ENT in the outpatient setting.  Appears to have had multiple infections now  presenting with intractable pain and external erythema over her superficial lateral mandible and maxillary regions. Imaging does not remark on any underlying fluid collection or abscess, notably in large lymph nodes in the area which may be contributing to her intractable pain.  Discussed case with on-call ENT who recommended starting Ciprodex  eardrops and to continue antibiotic coverage -recommends Augmentin  at discharge.  See details below.   Sepsis secondary to right otitis externa with concurrent cellulitis of the right side of the face, POA: Patient started on Ciprodex  eardrops and Zosyn  IV antibiotics.  Still complains of significant/excruciating pain around the right ear extending to the right side of the face.  On my examination, I could not appreciate any erythema at all yesterday or today, external ear canal has some serosanguineous discharge.  Has decreased hearing in the right ear.  Patient was seen by her primary ENT Dr. Karis yesterday, who recommended discharging patient on Augmentin  and Ciprodex  and to follow-up with him next day.  Patient and family will call his office to make an appointment to see him tomorrow.  Discharging on Percocet as needed per patient's request.  She will be taking ibuprofen  and Tylenol  alternatively as the first line of pain management.     History of ADHD: Takes as needed Adderall.   History of depression: Continue Lexapro .  Discharge plan was discussed with patient and/or family member and they verbalized understanding and agreed with it.  Discharge Diagnoses:  Principal Problem:   Right otitis externa Active Problems:   ADHD   Hair loss   MDD (major depressive disorder), recurrent episode, moderate (HCC)   Prediabetes   Acute otitis externa   Facial cellulitis    Discharge Instructions   Allergies as of 06/10/2024       Reactions   Morphine  Sulfate    Severe abd cramping    Benadryl  [diphenhydramine ] Other (See Comments)   Cramping muscles.   Pt reports she CAN take this medication safely when needed.        Medication List     STOP taking these medications    cefdinir  300 MG capsule Commonly known as: OMNICEF    clindamycin  1 % gel Commonly known as: CLINDAGEL    clindamycin  300 MG capsule Commonly known as: Cleocin    ketorolac  10 MG tablet Commonly known as: TORADOL    ondansetron  4 MG tablet Commonly known as: Zofran        TAKE these medications    acetaminophen  500 MG tablet Commonly known as: TYLENOL  Take 500-1,000 mg by mouth every 6 (six) hours as needed for moderate pain (pain score 4-6).   amoxicillin -clavulanate 875-125 MG tablet Commonly known as: AUGMENTIN  Take 1 tablet by mouth 2 (two) times daily for 7 days.   amphetamine -dextroamphetamine  10 MG tablet Commonly known as: ADDERALL Take 1 tablet (10 mg total) by mouth daily with breakfast.   calcium  carbonate 500 MG chewable tablet Commonly known as: TUMS - dosed in mg elemental calcium  Chew 1 tablet by mouth daily as needed for indigestion or heartburn.   cetirizine 10 MG tablet Commonly known as: ZYRTEC Take 10 mg by mouth daily as needed for allergies.   ciprofloxacin -dexamethasone  OTIC suspension Commonly known as: CIPRODEX  Place 4 drops into the right ear 2 (two) times daily for 10 days. What changed:  when to take this reasons to take this   cyclobenzaprine  5 MG tablet Commonly known as: FLEXERIL  Take 1 tablet (5 mg total) by mouth 3 (three) times daily as needed for muscle spasms.   dicyclomine  20 MG tablet Commonly known as: BENTYL  Take 0.5 tablets (10 mg total) by mouth 3 (three) times daily for abdominal pain.   escitalopram  20 MG tablet Commonly known as: Lexapro  Take 1 tablet (20 mg total) by mouth daily.   FT Allergy Relief 24 Hour 180 MG tablet Generic drug: fexofenadine  Take 1 tablet (180 mg total) by mouth daily. What changed:  when to take this reasons to take this   ibuprofen  200 MG tablet Commonly  known as: ADVIL  Take 200-800 mg by mouth every 6 (six) hours as needed for moderate pain (pain score 4-6).   ketoconazole  2 % shampoo Commonly known as: NIZORAL  Apply to scalp and shampoo as usual. Repeat weekly What changed:  how much to take when to take this reasons to take this   levonorgestrel  20 MCG/DAY Iud Commonly known as: MIRENA 1 each by Intrauterine route once.   loratadine 10 MG tablet Commonly known as: CLARITIN Take 10 mg by mouth daily as needed for allergies.   melatonin 3 MG Tabs tablet Take 2.5-5 mg by mouth at bedtime as needed (Sleep).   mirabegron  ER 25 MG Tb24 tablet Commonly known as: MYRBETRIQ  Take 1 tablet (25 mg total) by mouth daily. What changed:  when to take this reasons to take this   mometasone  0.1 % cream  Commonly known as: ELOCON  Apply topically daily as needed for itching.   multivitamin tablet Take 1 tablet by mouth daily.   naproxen  500 MG tablet Commonly known as: NAPROSYN  Take 1 tablet (500 mg total) by mouth as needed. Takes as needed only   oxyCODONE -acetaminophen  5-325 MG tablet Commonly known as: PERCOCET/ROXICET Take 1-2 tablets by mouth every 4 (four) hours as needed for severe pain (pain score 7-10).   polyethylene glycol 17 g packet Commonly known as: MIRALAX  / GLYCOLAX  Take 17 g by mouth daily as needed for moderate constipation.   prednisoLONE  acetate 1 % ophthalmic suspension Commonly known as: PRED FORTE  Place 4 drops into the right ear as needed for itching.   spironolactone  100 MG tablet Commonly known as: ALDACTONE  Take 1 tablet (100 mg total) by mouth daily. What changed: how much to take   traZODone  50 MG tablet Commonly known as: DESYREL  Take 1/2 tablet (25 mg total) by mouth at bedtime as needed for sleep.   tretinoin  0.01 % gel Commonly known as: RETIN-A  Apply topically at bedtime. What changed:  how much to take when to take this reasons to take this   verapamil  120 MG CR tablet Commonly  known as: CALAN -SR Take 1 tablet (120 mg total) by mouth at bedtime. What changed:  when to take this reasons to take this        Follow-up Information     Rudd, Garnette HERO, MD Follow up in 1 week(s).   Specialty: Family Medicine Contact information: [address] [place] [address] [phone]                Allergies  Allergen Reactions   Morphine  Sulfate     Severe abd cramping    Benadryl  [Diphenhydramine ] Other (See Comments)    Cramping muscles.  Pt reports she CAN take this medication safely when needed.    Consultations: ENT   Procedures/Studies: CT Soft Tissue Neck W Contrast Result Date: 06/07/2024 CLINICAL DATA:  Soft tissue infection suspected. Right otitis externa progressive swelling despite antibiotics. EXAM: CT NECK WITH CONTRAST TECHNIQUE: Multidetector CT imaging of the neck was performed using the standard protocol following the bolus administration of intravenous contrast. RADIATION DOSE REDUCTION: This exam was performed according to the departmental dose-optimization program which includes automated exposure control, adjustment of the mA and/or kV according to patient size and/or use of iterative reconstruction technique. CONTRAST:  75mL OMNIPAQUE  IOHEXOL  300 MG/ML  SOLN COMPARISON:  None Available. FINDINGS: There is significant soft tissue swelling along the right external auditory canal compatible with otitis externa. Swelling primarily involves the cartilaginous portion of the canal. There is additional abnormal soft tissue within the superior aspect of the osseous external auditory canal. There is mild narrowing of the right external auditory canal. Stranding within the surrounding subcutaneous tissues of the right ear extending inferiorly into the subcutaneous tissues adjacent to the posterior and superficial aspects of the right parotid gland. No focal fluid collection to suggest abscess formation. The right parotid gland is  asymmetrically enlarged with mild enhancement likely reflecting reactive inflammatory changes. There is no evidence of soft tissue swelling within deeper spaces of the neck. Pharynx and larynx: The nasopharynx is symmetric. Symmetric appearance of the palatine tonsils. The oral cavity, floor of mouth, and base of tongue are unremarkable. The epiglottis is unremarkable. Normal appearance of the retropharynx. Piriform sinuses and aryepiglottic folds are symmetric. Symmetric appearance of the vocal folds. Salivary glands: Right parotid gland changes as above. The left  parotid gland and submandibular glands are unremarkable. No abnormal calcification. Thyroid : Normal. Lymph nodes: Right level 2A cervical node measuring up to 1.1 cm in short axis demonstrating mild enhancement, likely reactive in setting of infection. There are additional asymmetrically prominent subcentimeter lymph nodes in right level 1 B and level 2 which are also likely reactive. Vascular: Negative. Limited intracranial: Limited visualization of intracranial structures without focal acute abnormality. Visualized orbits: Negative. Mastoids and visualized paranasal sinuses: Minimal mucosal thickening in the alveolar recess of the right maxillary sinus. No air-fluid levels. Mastoid air cells are clear. Skeleton: No acute or aggressive process. Upper chest: Negative. Other: None. IMPRESSION: Soft tissue swelling along the right external auditory canal compatible with a tightest externa. There is stranding within the adjacent subcutaneous tissues extending inferiorly over the parotid space compatible cellulitis. Asymmetric enlargement and enhancement of the right parotid gland likely reflecting reactive inflammatory changes. No fluid collection or evidence of abscess formation. No swelling within the deeper spaces of the neck. Prominent right-sided cervical nodes, likely reactive. Electronically Signed   By: Donnice Mania M.D.   On: 06/07/2024 20:37    CT Head Wo Contrast Result Date: 06/07/2024 CLINICAL DATA:  Headache EXAM: CT HEAD WITHOUT CONTRAST TECHNIQUE: Contiguous axial images were obtained from the base of the skull through the vertex without intravenous contrast. RADIATION DOSE REDUCTION: This exam was performed according to the departmental dose-optimization program which includes automated exposure control, adjustment of the mA and/or kV according to patient size and/or use of iterative reconstruction technique. COMPARISON:  None Available. FINDINGS: Brain: No acute intracranial abnormality. Specifically, no hemorrhage, hydrocephalus, mass lesion, acute infarction, or significant intracranial injury. Vascular: No hyperdense vessel or unexpected calcification. Skull: No acute calvarial abnormality. Sinuses/Orbits: No acute findings Other: None IMPRESSION: Normal study. Electronically Signed   By: Franky Crease M.D.   On: 06/07/2024 20:23     Discharge Exam: Vitals:   06/09/24 2206 06/10/24 0630  BP: (!) 101/57 106/68  Pulse: 67 68  Resp: 18 18  Temp: 97.9 F (36.6 C) 97.8 F (36.6 C)  SpO2: 98% 100%   Vitals:   06/09/24 0659 06/09/24 1336 06/09/24 2206 06/10/24 0630  BP: 117/71 104/74 (!) 101/57 106/68  Pulse: 68 68 67 68  Resp: 17 18 18 18   Temp: 98.2 F (36.8 C) 97.8 F (36.6 C) 97.9 F (36.6 C) 97.8 F (36.6 C)  TempSrc: Oral Oral  Oral  SpO2: 99% 100% 98% 100%  Weight:      Height:        General: Pt is alert, awake, not in acute distress Cardiovascular: RRR, S1/S2 +, no rubs, no gallops Respiratory: CTA bilaterally, no wheezing, no rhonchi Abdominal: Soft, NT, ND, bowel sounds + Extremities: no edema, no cyanosis    The results of significant diagnostics from this hospitalization (including imaging, microbiology, ancillary and laboratory) are listed below for reference.     Microbiology: Recent Results (from the past 240 hours)  Blood culture (routine x 2)     Status: None (Preliminary result)    Collection Time: 06/07/24  6:12 PM   Specimen: BLOOD  Result Value Ref Range Status   Specimen Description   Final    BLOOD RIGHT ANTECUBITAL Performed at [facility], [address], [place], [place] [address]    Special Requests   Final    Blood Culture adequate volume BOTTLES DRAWN AEROBIC AND ANAEROBIC Performed at [facility], [address] [place], [place] [address]    Culture  Final    NO GROWTH 2 DAYS Performed at [facility], [address] [place], [place] [address]    Report Status PENDING  Incomplete  Blood culture (routine x 2)     Status: None (Preliminary result)   Collection Time: 06/07/24  6:17 PM   Specimen: BLOOD  Result Value Ref Range Status   Specimen Description   Final    BLOOD LEFT ANTECUBITAL Performed at [facility], [address], [place], [place] [address]    Special Requests   Final    Blood Culture adequate volume BOTTLES DRAWN AEROBIC AND ANAEROBIC Performed at [facility], [address] [place], [place] [address]    Culture   Final    NO GROWTH 2 DAYS Performed at [facility], [address], [place], [place] [address]    Report Status PENDING  Incomplete  MRSA Next Gen by PCR, Nasal     Status: None   Collection Time: 06/08/24 11:36 AM   Specimen: Nasal Mucosa; Nasal Swab  Result Value Ref Range Status   MRSA by PCR Next Gen NOT DETECTED NOT DETECTED Final    Comment: (NOTE) The GeneXpert MRSA Assay (FDA approved for NASAL specimens only), is one component of a comprehensive MRSA colonization surveillance program. It is not intended to diagnose MRSA infection nor to guide or monitor treatment for MRSA infections. Test performance is not FDA approved in patients less than 84 years old. Performed at [facility], [address] [place], [place] [address]      Labs: BNP (last 3 results) No results for input(s):  BNP in the last 8760 hours. Basic Metabolic Panel: Recent Labs  Lab 06/07/24 1842 06/08/24 0326 06/09/24 0326 06/10/24 0720  NA 136 134* 135 136  K 3.6 3.5 3.8 3.8  CL 101 102 104 102  CO2 25 23 26 27   GLUCOSE 183* 153* 98 86  BUN 9 7 6 8   CREATININE 0.88 0.93 0.73 0.93  CALCIUM  9.1 7.9* 8.1* 8.4*   Liver Function Tests: Recent Labs  Lab 06/07/24 1842  AST 16  ALT 10  ALKPHOS 57  BILITOT 1.6*  PROT 6.9  ALBUMIN 4.3   No results for input(s): LIPASE, AMYLASE in the last 168 hours. No results for input(s): AMMONIA in the last 168 hours. CBC: Recent Labs  Lab 06/07/24 1842 06/08/24 0326 06/09/24 0326 06/10/24 0720  WBC 13.6* 11.4* 7.5 6.3  NEUTROABS 11.9* 8.9*  --   --   HGB 12.6 10.8* 10.4* 10.9*  HCT 36.7 32.0* 32.4* 33.5*  MCV 92.2 94.4 94.7 95.2  PLT 191 155 140* 154   Cardiac Enzymes: No results for input(s): CKTOTAL, CKMB, CKMBINDEX, TROPONINI in the last 168 hours. BNP: Invalid input(s): POCBNP CBG: No results for input(s): GLUCAP in the last 168 hours. D-Dimer No results for input(s): DDIMER in the last 72 hours. Hgb A1c No results for input(s): HGBA1C in the last 72 hours. Lipid Profile No results for input(s): CHOL, HDL, LDLCALC, TRIG, CHOLHDL, LDLDIRECT in the last 72 hours. Thyroid  function studies No results for input(s): TSH, T4TOTAL, T3FREE, THYROIDAB in the last 72 hours.  Invalid input(s): FREET3 Anemia work up No results for input(s): VITAMINB12, FOLATE, FERRITIN, TIBC, IRON, RETICCTPCT in the last 72 hours. Urinalysis    Component Value Date/Time   COLORURINE COLORLESS (A) 04/24/2023 1418   APPEARANCEUR CLEAR 04/24/2023 1418   LABSPEC 1.002 (L) 04/24/2023 1418   PHURINE 8.0 04/24/2023 1418  GLUCOSEU NEGATIVE 04/24/2023 1418   HGBUR SMALL (A) 04/24/2023 1418   BILIRUBINUR NEGATIVE 04/24/2023 1418   KETONESUR 20 (A) 04/24/2023 1418   PROTEINUR NEGATIVE 04/24/2023 1418    UROBILINOGEN 0.2 11/05/2017 1656   NITRITE NEGATIVE 04/24/2023 1418   LEUKOCYTESUR NEGATIVE 04/24/2023 1418   Sepsis Labs Recent Labs  Lab 06/07/24 1842 06/08/24 0326 06/09/24 0326 06/10/24 0720  WBC 13.6* 11.4* 7.5 6.3   Microbiology Recent Results (from the past 240 hours)  Blood culture (routine x 2)     Status: None (Preliminary result)   Collection Time: 06/07/24  6:12 PM   Specimen: BLOOD  Result Value Ref Range Status   Specimen Description   Final    BLOOD RIGHT ANTECUBITAL Performed at [facility], [address], [place], [place] [address]    Special Requests   Final    Blood Culture adequate volume BOTTLES DRAWN AEROBIC AND ANAEROBIC Performed at [facility], [address] [place], [place] [address]    Culture   Final    NO GROWTH 2 DAYS Performed at [facility], [address] [place], [place] [address]    Report Status PENDING  Incomplete  Blood culture (routine x 2)     Status: None (Preliminary result)   Collection Time: 06/07/24  6:17 PM   Specimen: BLOOD  Result Value Ref Range Status   Specimen Description   Final    BLOOD LEFT ANTECUBITAL Performed at [facility], [address] [place], [place] [address]    Special Requests   Final    Blood Culture adequate volume BOTTLES DRAWN AEROBIC AND ANAEROBIC Performed at [facility], [address] [place], [place] [address]    Culture   Final    NO GROWTH 2 DAYS Performed at [facility], [address], [place], [place] [address]    Report Status PENDING  Incomplete  MRSA Next Gen by PCR, Nasal     Status: None   Collection Time: 06/08/24 11:36 AM   Specimen: Nasal Mucosa; Nasal Swab  Result Value Ref Range Status   MRSA by PCR Next Gen NOT DETECTED NOT DETECTED Final    Comment: (NOTE) The GeneXpert MRSA Assay (FDA approved for NASAL specimens only), is one component of a comprehensive MRSA  colonization surveillance program. It is not intended to diagnose MRSA infection nor to guide or monitor treatment for MRSA infections. Test performance is not FDA approved in patients less than 11 years old. Performed at [facility], [address], [place], [place] [address]     FURTHER DISCHARGE INSTRUCTIONS:   Get Medicines reviewed and adjusted: Please take all your medications with you for your next visit with your Primary MD   Laboratory/radiological data: Please request your Primary MD to go over all hospital tests and procedure/radiological results at the follow up, please ask your Primary MD to get all Hospital records sent to his/her office.   In some cases, they will be blood work, cultures and biopsy results pending at the time of your discharge. Please request that your primary care M.D. goes through all the records of your hospital data and follows up on these results.   Also Note the following: If you experience worsening of your admission symptoms, develop shortness of breath, life threatening emergency, suicidal or homicidal thoughts you must seek medical attention immediately by calling 911 or calling your MD immediately  if symptoms less severe.   You must read  complete instructions/literature along with all the possible adverse reactions/side effects for all the Medicines you take and that have been prescribed to you. Take any new Medicines after you have completely understood and accpet all the possible adverse reactions/side effects.    patient was instructed, not to drive, operate heavy machinery, perform activities at heights, swimming or participation in water activities or provide baby-sitting services while on Pain, Sleep and Anxiety Medications; until their outpatient Physician has advised to do so again. Also recommended to not to take more than prescribed Pain, Sleep and Anxiety Medications.  It is not advisable to combine anxiety, sleep and  pain medications without talking with your primary care provider.     Wear Seat belts while driving.   Please note: You were cared for by a hospitalist during your hospital stay. Once you are discharged, your primary care physician will handle any further medical issues. Please note that NO REFILLS for any discharge medications will be authorized once you are discharged, as it is imperative that you return to your primary care physician (or establish a relationship with a primary care physician if you do not have one) for your post hospital discharge needs so that they can reassess your need for medications and monitor your lab values  Time coordinating discharge: Over 30 minutes  SIGNED:   Fredia Skeeter, MD  [facility] 06/10/2024, 10:21 AM *Please note that this is a verbal dictation therefore any spelling or grammatical errors are due to the Dragon [employer] One system interpretation. If 7PM-7AM, please contact night-coverage [email or web address]

## 2024-06-12 LAB — CULTURE, BLOOD (ROUTINE X 2)
Culture: NO GROWTH
Culture: NO GROWTH
Special Requests: ADEQUATE
Special Requests: ADEQUATE

## 2024-06-12 NOTE — Progress Notes (Signed)
 Patient ID: Tracy Gallegos, female   DOB: [birth year], 38 y.o.   MRN: [id number]  Follow-up: Right otitis externa  HPI: The patient is a 38 year old female who returns today for her follow-up evaluation.  The patient was admitted to [facility] 2 days ago for treatment of her severe right otitis externa.  She was treated with IV antibiotic and Ciprodex  eardrops.  Her CT scan did not show any abscess.  The patient was discharged home on oral Augmentin  and Ciprodex .  According to the patient, she continues to have significant right ear pain.  Her right ear hearing is also muffled.  Exam: General: Communicates without difficulty, well nourished, no acute distress. Head: Normocephalic, no evidence injury, no tenderness, facial buttresses intact without stepoff. Face/sinus: No tenderness to palpation and percussion. Facial movement is normal and symmetric. Eyes: PERRL, EOMI. No scleral icterus, conjunctivae clear. Neuro: CN II exam reveals vision grossly intact.  No nystagmus at any point of gaze. Ears: Auricles well formed without lesions.  The left ear canal and tympanic membrane are normal.  Purulent drainage is noted within the right ear canal.  The right ear canal is edematous and erythematous.  Under the operating microscope, the right ear canal is debrided with a suction catheter.  The right tympanic membrane is noted to be edematous.  Nose: External evaluation reveals normal support and skin without lesions.  Dorsum is intact.  Anterior rhinoscopy reveals congested mucosa over anterior aspect of inferior turbinates and intact septum.  No purulence noted. Oral:  Oral cavity and oropharynx are intact, symmetric, without erythema or edema.  Mucosa is moist without lesions. Neck: Full range of motion without pain.  There is no significant lymphadenopathy.  No masses palpable.  Thyroid  bed within normal limits to palpation.  Parotid glands and submandibular glands equal bilaterally without mass.   Trachea is midline. Neuro:  CN 2-12 grossly intact.   Assessment: 1.  Acute right otitis externa.  Plan: 1.  Otomicroscopy with debridement of the right ear canal. 2.  The physical exam findings are reviewed with the patient and her husband. 3.  The patient should complete her current course of Ciprodex  and Augmentin . 4.  The patient will return for reevaluation in 1 week.

## 2024-06-16 ENCOUNTER — Other Ambulatory Visit (HOSPITAL_COMMUNITY): Payer: Self-pay

## 2024-06-16 ENCOUNTER — Other Ambulatory Visit: Payer: Self-pay

## 2024-06-16 ENCOUNTER — Other Ambulatory Visit (HOSPITAL_BASED_OUTPATIENT_CLINIC_OR_DEPARTMENT_OTHER): Payer: Self-pay

## 2024-06-17 ENCOUNTER — Encounter (INDEPENDENT_AMBULATORY_CARE_PROVIDER_SITE_OTHER): Payer: Self-pay | Admitting: Otolaryngology

## 2024-06-17 ENCOUNTER — Ambulatory Visit (INDEPENDENT_AMBULATORY_CARE_PROVIDER_SITE_OTHER): Admitting: Otolaryngology

## 2024-06-17 VITALS — Ht 68.0 in | Wt 140.0 lb

## 2024-06-17 DIAGNOSIS — H6091 Unspecified otitis externa, right ear: Secondary | ICD-10-CM | POA: Diagnosis not present

## 2024-06-17 DIAGNOSIS — H60331 Swimmer's ear, right ear: Secondary | ICD-10-CM

## 2024-06-19 NOTE — Progress Notes (Signed)
 Patient ID: Tracy Gallegos, female   DOB: [birth year], 38 y.o.   MRN: [id number]  Follow-up: Acute right otitis externa  HPI: The patient is a 38 year old female who returns today for follow-up evaluation.  The patient was last seen 1 week ago.  At that time, she was noted to have a severe right otitis externa.  She was treated with Ciprodex  and Augmentin .  The patient returns today complaining of persistent right ear pain.  Her right ear hearing is also muffled.  The patient has changed her antibiotic to cefdinir  and clindamycin .  She denies any otorrhea or vertigo.  Exam: General: Communicates without difficulty, well nourished, no acute distress. Head: Normocephalic, no evidence injury, no tenderness, facial buttresses intact without stepoff. Face/sinus: No tenderness to palpation and percussion. Facial movement is normal and symmetric. Eyes: PERRL, EOMI. No scleral icterus, conjunctivae clear. Neuro: CN II exam reveals vision grossly intact.  No nystagmus at any point of gaze. Ears: Auricles well formed without lesions.  Ear canals are intact without mass or lesion.  No erythema or edema is appreciated.  The TMs are intact with crusting noted over the right tympanic membrane.  Nose: External evaluation reveals normal support and skin without lesions.  Dorsum is intact.  Anterior rhinoscopy reveals congested mucosa over anterior aspect of inferior turbinates and intact septum.  No purulence noted. Oral:  Oral cavity and oropharynx are intact, symmetric, without erythema or edema.  Mucosa is moist without lesions. Neck: Full range of motion without pain.  There is no significant lymphadenopathy.  No masses palpable.  Thyroid  bed within normal limits to palpation.  Parotid glands and submandibular glands equal bilaterally without mass.  Trachea is midline. Neuro:  CN 2-12 grossly intact.   Assessment: 1.  The patient's acute right otitis externa has objectively improved.  The ear canal swelling has  resolved.  However, the patient is still experiencing significant right ear pain.  Plan: 1.  The physical exam findings are reviewed with the patient. 2.  The patient is reassured that her right ear canal swelling has resolved. 3.  Complete her current course of cefdinir  and clindamycin . 4.  The patient will return for reevaluation in 2 weeks.

## 2024-06-25 ENCOUNTER — Emergency Department (HOSPITAL_COMMUNITY)

## 2024-06-25 ENCOUNTER — Other Ambulatory Visit: Payer: Self-pay

## 2024-06-25 ENCOUNTER — Encounter (HOSPITAL_COMMUNITY): Payer: Self-pay

## 2024-06-25 ENCOUNTER — Emergency Department (HOSPITAL_COMMUNITY)
Admission: EM | Admit: 2024-06-25 | Discharge: 2024-06-26 | Disposition: A | Discharge status: Home / Self Care | Attending: Emergency Medicine | Admitting: Emergency Medicine

## 2024-06-25 ENCOUNTER — Other Ambulatory Visit (HOSPITAL_COMMUNITY)

## 2024-06-25 DIAGNOSIS — R404 Transient alteration of awareness: Secondary | ICD-10-CM | POA: Diagnosis not present

## 2024-06-25 DIAGNOSIS — R55 Syncope and collapse: Secondary | ICD-10-CM | POA: Diagnosis not present

## 2024-06-25 DIAGNOSIS — R4182 Altered mental status, unspecified: Secondary | ICD-10-CM | POA: Diagnosis not present

## 2024-06-25 DIAGNOSIS — R9431 Abnormal electrocardiogram [ECG] [EKG]: Secondary | ICD-10-CM | POA: Diagnosis not present

## 2024-06-25 LAB — COMPREHENSIVE METABOLIC PANEL WITH GFR
ALT: 16 U/L (ref 0–44)
AST: 17 U/L (ref 15–41)
Albumin: 3.8 g/dL (ref 3.5–5.0)
Alkaline Phosphatase: 47 U/L (ref 38–126)
Anion gap: 11 (ref 5–15)
BUN: 13 mg/dL (ref 6–20)
CO2: 20 mmol/L — ABNORMAL LOW (ref 22–32)
Calcium: 8.8 mg/dL — ABNORMAL LOW (ref 8.9–10.3)
Chloride: 102 mmol/L (ref 98–111)
Creatinine, Ser: 0.77 mg/dL (ref 0.44–1.00)
GFR, Estimated: >60 mL/min (ref >60)
Glucose, Bld: 115 mg/dL — ABNORMAL HIGH (ref 70–99)
Potassium: 3.4 mmol/L — ABNORMAL LOW (ref 3.5–5.1)
Sodium: 133 mmol/L — ABNORMAL LOW (ref 135–145)
Total Bilirubin: 1.4 mg/dL — ABNORMAL HIGH (ref 0.0–1.2)
Total Protein: 6.3 g/dL — ABNORMAL LOW (ref 6.5–8.1)

## 2024-06-25 LAB — CBC
HCT: 32.8 % — ABNORMAL LOW (ref 36.0–46.0)
Hemoglobin: 11.4 g/dL — ABNORMAL LOW (ref 12.0–15.0)
MCH: 31.5 pg (ref 26.0–34.0)
MCHC: 34.8 g/dL (ref 30.0–36.0)
MCV: 90.6 fL (ref 80.0–100.0)
Platelets: 228 K/uL (ref 150–400)
RBC: 3.62 MIL/uL — ABNORMAL LOW (ref 3.87–5.11)
RDW: 12.3 % (ref 11.5–15.5)
WBC: 9.6 K/uL (ref 4.0–10.5)
nRBC: 0 % (ref 0.0–0.2)

## 2024-06-25 LAB — HCG, SERUM, QUALITATIVE: Preg, Serum: NEGATIVE

## 2024-06-25 NOTE — ED Triage Notes (Signed)
 Pt is coming in for  Altered mental status , she is alert and oriented, She reports after taking her kids on a play date, when she got home she looked at the neighbors house and was  transferred into the house . She has no other problems, no chest pain, no shortness of breath. Negative stroke screen. She does hyperventilate before these  episodes .   Medic vitals   120/82 70hr 16rr 98%ra 137bgl  20g left Ac

## 2024-06-25 NOTE — ED Provider Triage Note (Addendum)
 Emergency Medicine Provider Triage Evaluation Note  Tracy Gallegos , a 38 y.o. female  was evaluated in triage.  Pt complains of altered mental status.  Patient reports that she noted that around 6 PM when she extremity gets home she had an episode in which she began to somewhat dissociate felt that she had a out of body experience.  She states that she felt that she was transported into a neighbor's home.  She states that she has had this occur multiple times since this initial episode occurred at about 6 PM.  She reports that her husband was able to evaluate her at home and had no acutely concerning vitals.  9 1 was called and had no obvious abnormal EKG findings.  Denies any recent change or additions of any medications.  Last alcohol use was 1 week ago.  Denies nausea, vomiting, dizziness, headache, lightheadedness, chest pain, shortness of breath.  Review of Systems  Positive: As above Negative: As above  Physical Exam  BP 115/71 (BP Location: Right Arm)   Pulse 83   Temp 99.7 F (37.6 C) (Oral)   Resp (!) 22   LMP  (LMP Unknown)   SpO2 100%  Gen:   Awake, no distress   Resp:  Normal effort MSK:   Moves extremities without difficulty Other:  Bilateral upper and lower extremity strength is 5 out of 5, no facial droop or slurred speech, no subjective tingling or numbness.  Can answer all orientation questions appropriately.  She did briefly have some difficulty recalling one of her children's names which appeared to cause a fair amount of distress.  Medical Decision Making  Medically screening exam initiated at 9:46 PM.  Appropriate orders placed.  Tracy Gallegos was informed that the remainder of the evaluation will be completed by another provider, this initial triage assessment does not replace that evaluation, and the importance of remaining in the ED until their evaluation is complete.     Waynetta Metheny A, PA-C 06/25/24 2147    Shawn Dannenberg A, PA-C 06/25/24  2155

## 2024-06-25 NOTE — ED Provider Notes (Signed)
 [place] [facility] Provider Note   CSN: [id number] Arrival date & time: 06/25/24  2059     Patient presents with: Altered Mental Status   Tracy Gallegos is a 38 y.o. female with history of depression, endometriosis, ovarian cyst, chronic abdominal pain, IBS, fatigue.  Patient presents to ED for evaluation of altered mental status.  Reports that this evening she had just picked up her daughter from school and was putting her daughter in the car seat.  Reports that she just turned her head to the left to look at pretty houses when she all of a sudden had a feeling of pins-and-needles, tingling throughout her entire body.  Reports that this lasted for maybe 5 to 20 minutes, she is unsure.  Apparently, her daughter was concerned and called her father who arrived to the scene.  Patient apparently had all symptoms resolved and went home.  At home, patient had repeat episode where husband states that she gave out but did not lose consciousness.  Reports that patient just went out of it and was not responsive.  Reports that she was opening her eyes and looking around.  States that after this, the patient began to cry and was inconsolable for about 5 minutes.  The patient reports that currently she is asymptomatic.  Denies any headache, chest pain, shortness of breath, nausea, vomiting, abdominal pain.  States LMP is unknown because she has IUD.   Altered Mental Status      Prior to Admission medications   Medication Sig Start Date End Date Taking? Authorizing Provider  acetaminophen  (TYLENOL ) 500 MG tablet Take 500-1,000 mg by mouth every 6 (six) hours as needed for moderate pain (pain score 4-6).    [provider]  amphetamine -dextroamphetamine  (ADDERALL) 10 MG tablet Take 1 tablet (10 mg total) by mouth daily with breakfast. 04/20/24   Thedora Garnette HERO, MD  calcium  carbonate (TUMS - DOSED IN MG ELEMENTAL CALCIUM ) 500 MG chewable tablet Chew 1  tablet by mouth daily as needed for indigestion or heartburn.    [provider]  cetirizine (ZYRTEC) 10 MG tablet Take 10 mg by mouth daily as needed for allergies.    [provider]  ciprofloxacin -dexamethasone  (CIPRODEX ) OTIC suspension Place 4 drops into the right ear 2 (two) times daily for 10 days. 06/10/24   Vernon Ranks, MD  cyclobenzaprine  (FLEXERIL ) 5 MG tablet Take 1 tablet (5 mg total) by mouth 3 (three) times daily as needed for muscle spasms. 11/19/23   Kennyth Domino, FNP  dicyclomine  (BENTYL ) 20 MG tablet Take 0.5 tablets (10 mg total) by mouth 3 (three) times daily for abdominal pain. 01/20/23     escitalopram  (LEXAPRO ) 20 MG tablet Take 1 tablet (20 mg total) by mouth daily. 08/29/23   Thedora Garnette HERO, MD  fexofenadine  (ALLEGRA  ALLERGY) 180 MG tablet Take 1 tablet (180 mg total) by mouth daily. Patient taking differently: Take 180 mg by mouth daily as needed for allergies. 03/23/23   Lavell Bari DELENA, FNP  ibuprofen  (ADVIL ) 200 MG tablet Take 200-800 mg by mouth every 6 (six) hours as needed for moderate pain (pain score 4-6).    [provider]  ketoconazole  (NIZORAL ) 2 % shampoo Apply to scalp and shampoo as usual. Repeat weekly Patient taking differently: Apply 1 Application topically as needed for irritation. 06/12/23     levonorgestrel  (MIRENA) 20 MCG/DAY IUD 1 each by Intrauterine route once.    [provider]  loratadine (CLARITIN) 10  MG tablet Take 10 mg by mouth daily as needed for allergies.    [provider]  melatonin 3 MG TABS tablet Take 2.5-5 mg by mouth at bedtime as needed (Sleep).    [provider]  mirabegron  ER (MYRBETRIQ ) 25 MG TB24 tablet Take 1 tablet (25 mg total) by mouth daily. Patient taking differently: Take 25 mg by mouth daily as needed (Overactive bladder). 08/21/22     mometasone  (ELOCON ) 0.1 % cream Apply topically daily as needed for itching. 12/31/22   Karis Clunes, MD  Multiple Vitamin (MULTIVITAMIN)  tablet Take 1 tablet by mouth daily.    [provider]  naproxen  (NAPROSYN ) 500 MG tablet Take 1 tablet (500 mg total) by mouth as needed. Takes as needed only 01/21/23   Thedora Garnette HERO, MD  oxyCODONE -acetaminophen  (PERCOCET/ROXICET) 5-325 MG tablet Take 1-2 tablets by mouth every 4 (four) hours as needed for severe pain (pain score 7-10). 06/10/24   Vernon Ranks, MD  polyethylene glycol (MIRALAX  / GLYCOLAX ) 17 g packet Take 17 g by mouth daily as needed for moderate constipation.    [provider]  prednisoLONE  acetate (PRED FORTE ) 1 % ophthalmic suspension Place 4 drops into the right ear as needed for itching. 08/16/22   Karis Clunes, MD  spironolactone  (ALDACTONE ) 100 MG tablet Take 1 tablet (100 mg total) by mouth daily. Patient taking differently: Take 50 mg by mouth daily. 01/16/24   Thedora Garnette HERO, MD  traZODone  (DESYREL ) 50 MG tablet Take 1/2 tablet (25 mg total) by mouth at bedtime as needed for sleep. 11/26/23   Dohmeier, Dedra, MD  tretinoin  (RETIN-A ) 0.01 % gel Apply topically at bedtime. Patient taking differently: Apply 1 Application topically at bedtime as needed (Acne). 03/19/23   Moishe Chiquita HERO, NP  verapamil  (CALAN -SR) 120 MG CR tablet Take 1 tablet (120 mg total) by mouth at bedtime. Patient taking differently: Take 120 mg by mouth at bedtime as needed (SVT). 05/14/23   Dohmeier, Dedra, MD    Allergies: Penicillins, Morphine  sulfate, and Benadryl  [diphenhydramine ]    Review of Systems  All other systems reviewed and are negative.   Updated Vital Signs BP 115/71 (BP Location: Right Arm)   Pulse 83   Temp 99.7 F (37.6 C) (Oral)   Resp (!) 22   LMP  (LMP Unknown)   SpO2 100%   Physical Exam Vitals and nursing note reviewed.  Constitutional:      General: She is not in acute distress.    Appearance: She is well-developed.  HENT:     Head: Normocephalic and atraumatic.  Eyes:     Conjunctiva/sclera: Conjunctivae normal.  Cardiovascular:     Rate and  Rhythm: Normal rate and regular rhythm.     Heart sounds: No murmur heard. Pulmonary:     Effort: Pulmonary effort is normal. No respiratory distress.     Breath sounds: Normal breath sounds.  Abdominal:     Palpations: Abdomen is soft.     Tenderness: There is no abdominal tenderness.  Musculoskeletal:        General: No swelling.     Cervical back: Neck supple.  Skin:    General: Skin is warm and dry.     Capillary Refill: Capillary refill takes less than 2 seconds.  Neurological:     Mental Status: She is alert and oriented to person, place, and time. Mental status is at baseline.     Comments: CN III through XII are intact.  Intact finger-nose, heel-to-shin.  No pronator drift, slurred speech.  Equal strength throughout.  Equal sensation throughout.  PERRL.  Tracks cross midline.  Alert and oriented x 4.  Psychiatric:        Mood and Affect: Mood normal.     (all labs ordered are listed, but only abnormal results are displayed) Labs Reviewed  COMPREHENSIVE METABOLIC PANEL WITH GFR - Abnormal; Notable for the following components:      Result Value   Sodium 133 (*)    Potassium 3.4 (*)    CO2 20 (*)    Glucose, Bld 115 (*)    Calcium  8.8 (*)    Total Protein 6.3 (*)    Total Bilirubin 1.4 (*)    All other components within normal limits  CBC - Abnormal; Notable for the following components:   RBC 3.62 (*)    Hemoglobin 11.4 (*)    HCT 32.8 (*)    All other components within normal limits  HCG, SERUM, QUALITATIVE  RAPID URINE DRUG SCREEN, HOSP PERFORMED  URINALYSIS, ROUTINE W REFLEX MICROSCOPIC    EKG: EKG Interpretation Date/Time:  Friday June 25 2024 21:08:41 EDT Ventricular Rate:  72 PR Interval:  174 QRS Duration:  112 QT Interval:  428 QTC Calculation: 468 R Axis:   83  Text Interpretation: Normal sinus rhythm Normal ECG Confirmed by Griselda Norris [phone]) on 06/25/2024 11:20:50 PM  Radiology: [facility] Chest 1 View Result Date: 06/26/2024 CLINICAL DATA:   Syncope EXAM: DG CHEST 1V COMPARISON:  12/23/2017 FINDINGS: The heart size and mediastinal contours are within normal limits. Both lungs are clear. The visualized skeletal structures are unremarkable. No pneumothorax. IMPRESSION: No active disease. Electronically Signed   By: Franky Crease M.D.   On: 06/26/2024 00:03   CT Head Wo Contrast Result Date: 06/25/2024 EXAM: CT HEAD WITHOUT CONTRAST 06/25/2024 10:01:00 PM TECHNIQUE: CT of the head was performed without the administration of intravenous contrast. Automated exposure control, iterative reconstruction, and/or weight based adjustment of the mA/kV was utilized to reduce the radiation dose to as low as reasonably achievable. COMPARISON: 06/07/2024 CLINICAL HISTORY: Mental status change, unknown cause. Chief complaints; Altered Mental Status. FINDINGS: BRAIN AND VENTRICLES: No acute hemorrhage. Gray-white differentiation is preserved. No hydrocephalus. No extra-axial collection. No mass effect or midline shift. ORBITS: No acute abnormality. SINUSES: No acute abnormality. SOFT TISSUES AND SKULL: No acute soft tissue abnormality. No skull fracture. IMPRESSION: 1. No acute intracranial abnormality. Electronically signed by: Franky Stanford MD 06/25/2024 10:48 PM EDT RP Workstation: [id number]     Procedures   Medications Ordered in the ED - No data to display  Clinical Course as of 06/26/24 0215  Sat Jun 26, 2024  0023 Urine and then discharge [CG]    Clinical Course User Index [CG] Ruthell Lonni FALCON, PA-C    Medical Decision Making Amount and/or Complexity of Data Reviewed Labs: ordered.   This is a 38 year old female presenting to the ED out of concern of altered mental status.  On examination, she is afebrile and nontachycardic.  Her lung sounds are clear bilaterally, she is not hypoxic.  Abdomen is soft and compressible.  Neuroexam at baseline without focal neurodeficits.  Alert and oriented x 4.  Denies SI, HI, AVH.  Patient labwork  initiated in triage include CBC, CMP, hCG, urinalysis, UDS, CT head, chest x-ray, EKG.  CBC without leukocytosis, baseline hemoglobin.  Metabolic panel with sodium 133, potassium 3.4.  No elevated LFTs, anion gap 11.  UDS negative for all.  hCG negative.  CT  head unremarkable.  Chest x-ray unremarkable.  EKG nonischemic.  At this time, patient workup unremarkable.  States she currently feels back to her baseline.  Patient will be discharged home at this time, will follow-up with neurology.  She was given return precautions and she voiced understanding.  She is stable to discharge home.    Final diagnoses:  Altered mental status, unspecified altered mental status type    ED Discharge Orders     None          Ruthell Lonni JULIANNA DEVONNA 06/26/24 0238    Griselda Norris, MD 06/27/24 [phone]

## 2024-06-25 NOTE — ED Notes (Signed)
 Patient unable to urinate at this time .

## 2024-06-26 LAB — RAPID URINE DRUG SCREEN, HOSP PERFORMED
Amphetamines: NOT DETECTED
Barbiturates: NOT DETECTED
Benzodiazepines: NOT DETECTED
Cocaine: NOT DETECTED
Opiates: NOT DETECTED
Tetrahydrocannabinol: NOT DETECTED

## 2024-06-26 NOTE — Discharge Instructions (Signed)
 It was a pleasure taking part in your care.  As we discussed, your work appears reassuring.  Please follow-up with your PCP, neurology team for further care.  Return to the ED with any new symptoms.

## 2024-07-02 ENCOUNTER — Other Ambulatory Visit (HOSPITAL_COMMUNITY): Payer: Self-pay

## 2024-07-02 ENCOUNTER — Other Ambulatory Visit: Payer: Self-pay

## 2024-07-06 ENCOUNTER — Ambulatory Visit (INDEPENDENT_AMBULATORY_CARE_PROVIDER_SITE_OTHER): Admitting: Otolaryngology

## 2024-07-07 ENCOUNTER — Encounter (INDEPENDENT_AMBULATORY_CARE_PROVIDER_SITE_OTHER): Payer: Self-pay

## 2024-07-19 ENCOUNTER — Ambulatory Visit (INDEPENDENT_AMBULATORY_CARE_PROVIDER_SITE_OTHER): Admitting: Neurology

## 2024-07-19 DIAGNOSIS — F514 Sleep terrors [night terrors]: Secondary | ICD-10-CM

## 2024-07-19 DIAGNOSIS — R4182 Altered mental status, unspecified: Secondary | ICD-10-CM | POA: Diagnosis not present

## 2024-07-19 DIAGNOSIS — F518 Other sleep disorders not due to a substance or known physiological condition: Secondary | ICD-10-CM

## 2024-07-19 DIAGNOSIS — G43109 Migraine with aura, not intractable, without status migrainosus: Secondary | ICD-10-CM

## 2024-07-21 ENCOUNTER — Ambulatory Visit: Payer: Self-pay | Admitting: Neurology

## 2024-07-21 NOTE — Progress Notes (Signed)
 [facility]  EEG (ELECTROENCEPHALOGRAM) REPORT  Tracy Gallegos    ORDERING CLINICIAN: Dedra Gores, M.D.  TECHNOLOGIST: , Burnard Plummer , REEGT TECHNIQUE:  This EEG study was performed according to the 10-20 International system of electrode placement. Electrical activity was reviewed with band pass filter of 1-70Hz , sensitivity of 7 uV/mm, display speed of 56mm/sec with a 60Hz  notched filter applied as appropriate. EEG data were recorded continuously and digitally stored.    Recoding duration : 25 m and 29 s Activation included:  Photic stimulation and Hyperventilation.      Description: The EEG's posterior dominant background rhythm of 11 hertz was symmetrically displayed while the patient's eyes were closed  and attenuated promptly with eye opening. At baseline, the recording showed a moderate -high  amplitude in symmetric fashion. Frontal muscle activation persisted throughout the first 20 minutes of the recording.   Photic stimulation was initiated at frequencies from 3- through 21 Hertz, resulting in strong photic entrainment at all frequencies accompanied by eyelid flutter artefact .Hyperventilation maneuver was initiated,  leading to amplitude build-up,  not leading to slowing.  Anterior fronto-polar muscle artefact continued.  Following hyperventilation the patient's EEG was reviewed for a period of 1 and 2 minutes post maneuver, and indicated hypersynchrony of all activity channels with relaxation ( slow 3 hertz waves )  and drowsiness , finally entering early NREM sleep .   The patient was recorded while awake, while drowsy ,and  while asleep.   ECG: heart rate at 65-75 bpm in normal sinus rhythm.   IMPRESSION:  This is a normal EEG by absence of any epileptiform discharges but  the hypersynchrony is an unusual finding in an adult patient and may indicate sleep deprivation.     Dedra Gores, M.D. Neurology and Sleep Medicine  Accredited by  the ABPN, ABSM.

## 2024-07-21 NOTE — Procedures (Signed)
 [facility]  EEG (ELECTROENCEPHALOGRAM) REPORT  Almarie DELENA Hummer    ORDERING CLINICIAN: Dedra Gores, M.D.  TECHNOLOGIST: , Burnard Plummer , REEGT TECHNIQUE:  This EEG study was performed according to the 10-20 International system of electrode placement. Electrical activity was reviewed with band pass filter of 1-70Hz , sensitivity of 7 uV/mm, display speed of 43mm/sec with a 60Hz  notched filter applied as appropriate. EEG data were recorded continuously and digitally stored.    Recoding duration : 25 m and 29 s Activation included:  Photic stimulation and Hyperventilation.      Description: The EEG's posterior dominant background rhythm of 11 hertz was symmetrically displayed while the patient's eyes were closed  and attenuated promptly with eye opening. At baseline, the recording showed a moderate -high  amplitude in symmetric fashion. Frontal muscle activation persisted throughout the first 20 minutes of the recording.   Photic stimulation was initiated at frequencies from 3- through 21 Hertz, resulting in strong photic entrainment at all frequencies accompanied by eyelid flutter artefact .Hyperventilation maneuver was initiated,  leading to amplitude build-up,  not leading to slowing.  Anterior fronto-polar muscle artefact continued.  Following hyperventilation the patient's EEG was reviewed for a period of 1 and 2 minutes post maneuver, and indicated hypersynchrony of all activity channels with relaxation ( slow 3 hertz waves )  and drowsiness , finally entering early NREM sleep .   The patient was recorded while awake, while drowsy and while asleep.   EKG : during this EEG recording the single ECG channel showed a regular  heart rate at 65-75 bpm in normal sinus rhythm.   IMPRESSION:  This is a normal EEG by absence of any epileptiform discharges but  the hypersynchrony is an unusual finding in an adult patient and may indicate sleep deprivation.      Dedra Gores, M.D. Neurology and Sleep Medicine  Accredited by the ABPN, ABSM.

## 2024-08-11 ENCOUNTER — Other Ambulatory Visit: Payer: Self-pay | Admitting: Family Medicine

## 2024-08-11 ENCOUNTER — Other Ambulatory Visit: Payer: Self-pay

## 2024-08-11 ENCOUNTER — Other Ambulatory Visit (HOSPITAL_BASED_OUTPATIENT_CLINIC_OR_DEPARTMENT_OTHER): Payer: Self-pay

## 2024-08-11 DIAGNOSIS — G479 Sleep disorder, unspecified: Secondary | ICD-10-CM

## 2024-08-11 DIAGNOSIS — R5383 Other fatigue: Secondary | ICD-10-CM

## 2024-08-11 MED ORDER — AMPHETAMINE-DEXTROAMPHETAMINE 10 MG PO TABS
10.0000 mg | ORAL_TABLET | Freq: Every day | ORAL | 0 refills | Status: DC
  Filled 2024-08-11: qty 30, 30d supply, fill #0

## 2024-08-11 NOTE — Telephone Encounter (Signed)
 Appointment scheduled for 09/01/24. Dm/cma

## 2024-08-16 ENCOUNTER — Other Ambulatory Visit (HOSPITAL_BASED_OUTPATIENT_CLINIC_OR_DEPARTMENT_OTHER): Payer: Self-pay

## 2024-08-25 ENCOUNTER — Ambulatory Visit: Admitting: Family Medicine

## 2024-08-25 ENCOUNTER — Encounter: Payer: Self-pay | Admitting: Family Medicine

## 2024-08-25 VITALS — BP 116/68 | HR 84 | Temp 97.0°F | Ht 68.0 in | Wt 135.2 lb

## 2024-08-25 DIAGNOSIS — Z23 Encounter for immunization: Secondary | ICD-10-CM | POA: Diagnosis not present

## 2024-08-25 DIAGNOSIS — L658 Other specified nonscarring hair loss: Secondary | ICD-10-CM

## 2024-08-25 DIAGNOSIS — F331 Major depressive disorder, recurrent, moderate: Secondary | ICD-10-CM

## 2024-08-25 NOTE — Progress Notes (Signed)
 [facility] [address] [place] [address] Dept: [phone] Dept Fax: [phone]  Chronic Care Office Visit  Subjective:    Patient ID: Tracy Gallegos, female    DOB: 1985-12-04, 38 y.o..   MRN: [id number]  Chief Complaint  Patient presents with   Depression    6 month f/u depression.  Referral to psychiatrist and dermatology.  Flu shot today.    History of Present Illness:  Patient is in today for reassessment of chronic medical issues.  Tracy Gallegos has a history of depression with anxiety. She is managed on escitalopram  20 mg daily. She has experienced multiple family and marital stressors. She finds that she still struggles with this at times. She asks about a referral to a psychiatrist to help with medication management. She had an episode of altered mental status with a sensation of pins & needles throughout her body and tingling a period where she did not respond to others. She was seen in the ED on 8/15 with this. Workup was not revealing of an organic cause. She is considering that this may be associated with her psychiatric issues. She also wonders if she could have PTSD related to childhood traumas.   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 She is currently managed on minoxidil (Rogaine) topical daily. She would prefer to be on a compounded topical finasteride . Her current dermatologist has been reluctant to prescribe this. She would like to see a different dermatologist to see if this is a possible therapy.   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   Elevated AST (SGOT) 05/01/2023   Ocular migraine 04/30/2023   Nocturia 04/02/2023   Night terrors 04/02/2023   Abnormal dreams 04/02/2023   Snoring 04/02/2023   History of colonic polyps 01/21/2023   Scarlet fever 01/21/2023   Irritable bowel syndrome (IBS) 10/16/2022   IUD (intrauterine device) in place 10/16/2022   Acne 10/16/2022   Hypersomnolence disorder 10/16/2022   Seborrhea 10/16/2021   Hair loss 10/16/2021   Chronic abdominal pain 08/02/2021   Fatigue 06/07/2019   Depression with anxiety 06/30/2018   History of gestational diabetes 06/30/2018   Sleep disorder, unspecified 06/30/2018   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   Anti-D antibodies present during pregnancy 10-Oct-2017   Primary female infertility 09/15/2014   ADHD 09/15/2014   Fibromyalgia 08/03/2013   Past Surgical History:  Procedure Laterality Date   OVARIAN CYST SURGERY     WISDOM TOOTH EXTRACTION     Family History  Problem Relation Age of Onset   Obesity Mother    Depression Mother    Alcohol abuse Mother    Heart disease Father    Early death Father    Obesity Brother    Alcohol abuse Maternal Uncle    Depression Maternal Uncle    Hypertension Maternal Grandmother    Depression Maternal Grandmother    Alcohol abuse Maternal Grandmother    Alzheimer's disease Maternal Grandmother    Depression Maternal Grandfather    Stroke Maternal Grandfather    Heart disease Maternal Grandfather    Suicidality Maternal Grandfather    Suicidality Paternal Grandmother  Depression Paternal Grandmother    Heart disease Paternal Grandfather    Early death Paternal Grandfather    Outpatient Medications Prior to Visit  Medication Sig Dispense Refill   acetaminophen  (TYLENOL ) 500 MG tablet Take 500-1,000 mg by mouth every 6 (six) hours as needed for moderate pain (pain score 4-6).     amphetamine -dextroamphetamine  (ADDERALL) 10 MG tablet Take 1 tablet (10 mg total) by mouth daily with breakfast.  30 tablet 0   calcium  carbonate (TUMS - DOSED IN MG ELEMENTAL CALCIUM ) 500 MG chewable tablet Chew 1 tablet by mouth daily as needed for indigestion or heartburn.     cetirizine (ZYRTEC) 10 MG tablet Take 10 mg by mouth daily as needed for allergies.     ciprofloxacin -dexamethasone  (CIPRODEX ) OTIC suspension Place 4 drops into the right ear 2 (two) times daily for 10 days. 7.5 mL 0   escitalopram  (LEXAPRO ) 20 MG tablet Take 1 tablet (20 mg total) by mouth daily. 180 tablet 3   fexofenadine  (ALLEGRA  ALLERGY) 180 MG tablet Take 1 tablet (180 mg total) by mouth daily. 100 tablet 1   ibuprofen  (ADVIL ) 200 MG tablet Take 200-800 mg by mouth every 6 (six) hours as needed for moderate pain (pain score 4-6).     ketoconazole  (NIZORAL ) 2 % shampoo Apply to scalp and shampoo as usual. Repeat weekly (Patient taking differently: Apply 1 Application topically as needed for irritation.) 120 mL 11   levonorgestrel  (MIRENA) 20 MCG/DAY IUD 1 each by Intrauterine route once.     loratadine (CLARITIN) 10 MG tablet Take 10 mg by mouth daily as needed for allergies.     melatonin 3 MG TABS tablet Take 2.5-5 mg by mouth at bedtime as needed (Sleep).     mometasone  (ELOCON ) 0.1 % cream Apply topically daily as needed for itching. 15 g 5   Multiple Vitamin (MULTIVITAMIN) tablet Take 1 tablet by mouth daily.     naproxen  (NAPROSYN ) 500 MG tablet Take 1 tablet (500 mg total) by mouth as needed. Takes as needed only 30 tablet 2   polyethylene glycol (MIRALAX  / GLYCOLAX ) 17 g packet Take 17 g by mouth daily as needed for moderate constipation.     prednisoLONE  acetate (PRED FORTE ) 1 % ophthalmic suspension Place 4 drops into the right ear as needed for itching. 5 mL 5   spironolactone  (ALDACTONE ) 100 MG tablet Take 1 tablet (100 mg total) by mouth daily. 90 tablet 1   traZODone  (DESYREL ) 50 MG tablet Take 1/2 tablet (25 mg total) by mouth at bedtime as needed for sleep. 30 tablet 3   tretinoin  (RETIN-A ) 0.01 % gel Apply  topically at bedtime. 15 g 0   cyclobenzaprine  (FLEXERIL ) 5 MG tablet Take 1 tablet (5 mg total) by mouth 3 (three) times daily as needed for muscle spasms. (Patient not taking: Reported on 08/25/2024) 30 tablet 1   dicyclomine  (BENTYL ) 20 MG tablet Take 0.5 tablets (10 mg total) by mouth 3 (three) times daily for abdominal pain. (Patient not taking: Reported on 08/25/2024) 45 tablet 5   mirabegron  ER (MYRBETRIQ ) 25 MG TB24 tablet Take 1 tablet (25 mg total) by mouth daily. (Patient not taking: Reported on 08/25/2024) 30 tablet 3   oxyCODONE -acetaminophen  (PERCOCET/ROXICET) 5-325 MG tablet Take 1-2 tablets by mouth every 4 (four) hours as needed for severe pain (pain score 7-10). 15 tablet 0   verapamil  (CALAN -SR) 120 MG CR tablet Take 1 tablet (120 mg total) by mouth at bedtime. (Patient not taking: Reported on 08/25/2024) 30  tablet 1   No facility-administered medications prior to visit.   Allergies  Allergen Reactions   Penicillins Hives   Morphine  Sulfate     Severe abd cramping    Benadryl  [Diphenhydramine ] Other (See Comments)    Cramping muscles.  Pt reports she CAN take this medication safely when needed.   Objective:   Today's Vitals   08/25/24 1300  BP: 116/68  Pulse: 84  Temp: (!) 97 F (36.1 C)  TempSrc: Temporal  SpO2: 100%  Weight: 135 lb 3.2 oz (61.3 kg)  Height: 5' 8 (1.727 m)   Body mass index is 20.56 kg/m.   General: Well developed, well nourished. No acute distress. Scalp: Thinning along apex of scalp, but no areas of alopecia. Psych: Alert and oriented. Normal mood and affect.  Health Maintenance Due  Topic Date Due   Hepatitis B Vaccines 19-59 Average Risk (1 of 3 - 19+ 3-dose series) Never done   HPV VACCINES (2 - 3-dose SCDM series) 08/02/2020   Influenza Vaccine  06/11/2024     Assessment & Plan:   Problem List Items Addressed This Visit       Musculoskeletal and Integument   Female pattern hair loss   Continue use of topical minoxidil for  now. I will refer her to [facility] to discuss options for further management.      Relevant Orders   Ambulatory referral to Dermatology     Other   MDD (major depressive disorder), recurrent episode, moderate (HCC) - Primary   Ms. Belardo has had complex depression and anxiety. She has had multiple neurological symptoms over the past 2 years which could reflect conversion reactions. Her most recent episode sounds like a brief catatonia. I will refer her to psychiatry for a more thorough evaluation and to discuss therapeutic options.      Relevant Orders   Ambulatory referral to Psychiatry   Other Visit Diagnoses       Need for immunization against influenza       Relevant Orders   Flu vaccine trivalent PF, 6mos and older(Flulaval,Afluria,Fluarix,Fluzone) (Completed)       Return in about 6 months (around 02/23/2025) for Reassessment.   Garnette CHRISTELLA Simpler, MD

## 2024-08-25 NOTE — Assessment & Plan Note (Signed)
 Tracy Gallegos has had complex depression and anxiety. She has had multiple neurological symptoms over the past 2 years which could reflect conversion reactions. Her most recent episode sounds like a brief catatonia. I will refer her to psychiatry for a more thorough evaluation and to discuss therapeutic options.

## 2024-08-25 NOTE — Assessment & Plan Note (Signed)
 Continue use of topical minoxidil for now. I will refer her to [facility] to discuss options for further management.

## 2024-09-01 ENCOUNTER — Ambulatory Visit: Admitting: Family Medicine

## 2024-09-09 DIAGNOSIS — H5213 Myopia, bilateral: Secondary | ICD-10-CM | POA: Diagnosis not present

## 2024-09-13 ENCOUNTER — Encounter

## 2024-09-13 ENCOUNTER — Ambulatory Visit: Admitting: Podiatry

## 2024-09-13 ENCOUNTER — Ambulatory Visit (INDEPENDENT_AMBULATORY_CARE_PROVIDER_SITE_OTHER)

## 2024-09-13 ENCOUNTER — Encounter: Payer: Self-pay | Admitting: Podiatry

## 2024-09-13 DIAGNOSIS — M21612 Bunion of left foot: Secondary | ICD-10-CM | POA: Diagnosis not present

## 2024-09-13 DIAGNOSIS — M2012 Hallux valgus (acquired), left foot: Secondary | ICD-10-CM

## 2024-09-13 DIAGNOSIS — M2011 Hallux valgus (acquired), right foot: Secondary | ICD-10-CM

## 2024-09-13 NOTE — Progress Notes (Signed)
  Subjective:  Patient ID: Tracy Gallegos, female    DOB: August 07, 1986,   MRN: [id number]  Chief Complaint  Patient presents with   Foot Pain    I have a bunion on my left foot that has been bothering me.    38 y.o. female presents for left foot bunion that has been present for years.  Relates that it started to been painful in the last few weeks.  She relates she has tried different shoes.  She is wondering if there is anything that can be done early on to help with the bunion aside from surgery. Denies any other pedal complaints. Denies n/v/f/c.   Past Medical History:  Diagnosis Date   Depression    Endometriosis    History of gestational diabetes 01/23/2018   Ovarian cyst    Bilateral    Objective:  Physical Exam: Vascular: DP/PT pulses 2/4 bilateral. CFT <3 seconds. Normal hair growth on digits. No edema.  Skin. No lacerations or abrasions bilateral feet.  Musculoskeletal: MMT 5/5 bilateral lower extremities in DF, PF, Inversion and Eversion. Deceased ROM in DF of ankle joint.  Left  HAV deformity noted.  Mildly tender to medial eminence.  No pain with range of motion of the first MPJ no restriction range of motion.  No hypermobility noted of the first ray. Neurological: Sensation intact to light touch.   Assessment:   1. Bunion of left foot      Plan:  Patient was evaluated and treated and all questions answered. -Xrays reviewed.  No acute fractures or dislocations noted.  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  Sesamoid position of 3.  No degenerative changes noted to the first MPJ. -Discussed HAV and treatment options;conservative and surgical management; risks, benefits, alternatives discussed. All patient's questions answered. -Discussed padding and wide shoe gear.   -Recommend continue with good supportive shoes and inserts.  -Discussed surgical options.  -Patient to return to office as needed or sooner if condition  worsens.   Asberry Failing, DPM

## 2024-09-23 ENCOUNTER — Ambulatory Visit: Admitting: Dermatology

## 2024-09-23 ENCOUNTER — Encounter: Payer: Self-pay | Admitting: Dermatology

## 2024-09-23 DIAGNOSIS — L680 Hirsutism: Secondary | ICD-10-CM

## 2024-09-23 DIAGNOSIS — L649 Androgenic alopecia, unspecified: Secondary | ICD-10-CM | POA: Diagnosis not present

## 2024-09-23 MED ORDER — SAFETY SEAL MISCELLANEOUS MISC: 11 refills | Status: AC

## 2024-09-23 NOTE — Progress Notes (Signed)
   New Patient Visit   Subjective  Tracy Gallegos is a 38 y.o. female who presents for a NEW PATIENT appointment to be examined for the concerns as listed below.   Hair Loss: She was previously seen by Dr. Mollie a year ago but Tracy Gallegos is closer to her so she wanted to switch dermatologist. She started her on oral finasteride  that she took for 31mo but D/C due to side effects with mood changes. She is currently only doing topical minoxidil nightly over the last 3 years which she has seen a lot of improvement but she has seen an increase in shedding over the last 12mo and is interested in discussing a topical finasteride .    Are you nursing, pregnant or trying to conceive? No   The following portions of the chart were reviewed this encounter and updated as appropriate: medications, allergies, medical history  Review of Systems:  No other skin or systemic complaints except as noted in HPI or Assessment and Plan.  Objective  Well appearing patient in no apparent distress; mood and affect are within normal limits.   A focused examination was performed of the following areas: scalp   Relevant exam findings are noted in the Assessment and Plan.                  Assessment & Plan   Androgenic alopecia Chronic androgenic alopecia with recent increase in shedding over the last six months. Previously on oral finasteride , discontinued due to mood changes. Currently using topical minoxidil 5% foam with improvement but recent increase in shedding. Interested in topical finasteride . No significant hair loss on examination, but some thinning on the sides. Discussed potential for shedding with new regimen, though unlikely due to continued use of minoxidil. Discussed potential for oral minoxidil if no improvement in six months, with 90% of patients on half a tablet seeing great regrowth without excessive facial hair. - Prescribed compounded topical minoxidil 8% and finasteride  0.1%  to be applied once daily in the morning. - Advised on proper application technique to avoid unwanted hair growth on the face. - Recommended Vital Proteins collagen supplement to strengthen hair and improve growth. - Scheduled follow-up in six months to assess progress.  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 Currently using an epilator for management. Discussed hormonal influence on hair growth and potential for oral minoxidil to address hirsutism if needed. - Will consider oral minoxidil if facial hirsutism becomes problematic.   Return in about 6 months (around 03/23/2025) for ANDROGENETIC ALOPECIA.   Documentation: I have reviewed the above documentation for accuracy and completeness, and I agree with the above.  I, Shirron Maranda, CMA, am acting as scribe for [employer], DO.   Delon Lenis, DO

## 2024-09-23 NOTE — Patient Instructions (Addendum)
 VISIT SUMMARY:  You came in today to discuss your ongoing hair loss and facial hair growth. We reviewed your history and current treatments, and made some adjustments to your regimen to help improve your hair density and manage your symptoms more effectively.  YOUR PLAN:  -ANDROGENIC ALOPECIA:  Androgenic alopecia is a common form of hair loss in both men and women, often related to hormones and genetics.   We have prescribed a compounded topical solution containing 8% minoxidil and 0.1% finasteride  to be applied once daily in the morning.   This should help increase hair density. We also discussed the proper application technique to avoid unwanted hair growth on the face. Additionally, we recommended taking Vital Proteins collagen supplement to strengthen your hair and improve growth.   We will reassess your progress in six months, and if there is no improvement, we may consider oral minoxidil at that time.  -FACIAL HIRSUTISM:  Facial hirsutism is the growth of thick, dark hair on the face, often influenced by hormones. You are currently managing this with an epilator. We discussed the potential for oral minoxidil if the facial hair growth becomes more problematic in the future.  INSTRUCTIONS:  Please follow the new regimen as prescribed and apply the topical solution once daily in the morning. Continue using the epilator for facial hair management. Take the Vital Proteins collagen supplement as recommended. We will have a follow-up appointment in six months to assess your progress. If you notice any significant changes or have concerns before then, please contact our office.         Important Information  Due to recent changes in healthcare laws, you may see results of your pathology and/or laboratory studies on MyChart before the doctors have had a chance to review them. We understand that in some cases there may be results that are confusing or concerning to you. Please understand  that not all results are received at the same time and often the doctors may need to interpret multiple results in order to provide you with the best plan of care or course of treatment. Therefore, we ask that you please give us  2 business days to thoroughly review all your results before contacting the office for clarification. Should we see a critical lab result, you will be contacted sooner.   If You Need Anything After Your Visit  If you have any questions or concerns for your doctor, please call our main line at [phone] If no one answers, please leave a voicemail as directed and we will return your call as soon as possible. Messages left after 4 pm will be answered the following business day.   You may also send us  a message via MyChart. We typically respond to MyChart messages within 1-2 business days.  For prescription refills, please ask your pharmacy to contact our office. Our fax number is [phone].  If you have an urgent issue when the clinic is closed that cannot wait until the next business day, you can page your doctor at the number below.    Please note that while we do our best to be available for urgent issues outside of office hours, we are not available 24/7.   If you have an urgent issue and are unable to reach us , you may choose to seek medical care at your doctor's office, retail clinic, urgent care center, or emergency room.  If you have a medical emergency, please immediately call 911 or go to the emergency department. In the event  of inclement weather, please call our main line at [phone] for an update on the status of any delays or closures.  Dermatology Medication Tips: Please keep the boxes that topical medications come in in order to help keep track of the instructions about where and how to use these. Pharmacies typically print the medication instructions only on the boxes and not directly on the medication tubes.   If your medication is too  expensive, please contact our office at [phone] or send us  a message through MyChart.   We are unable to tell what your co-pay for medications will be in advance as this is different depending on your insurance coverage. However, we may be able to find a substitute medication at lower cost or fill out paperwork to get insurance to cover a needed medication.   If a prior authorization is required to get your medication covered by your insurance company, please allow us  1-2 business days to complete this process.  Drug prices often vary depending on where the prescription is filled and some pharmacies may offer cheaper prices.  The website www.goodrx.com contains coupons for medications through different pharmacies. The prices here do not account for what the cost may be with help from insurance (it may be cheaper with your insurance), but the website can give you the price if you did not use any insurance.  - You can print the associated coupon and take it with your prescription to the pharmacy.  - You may also stop by our office during regular business hours and pick up a GoodRx coupon card.  - If you need your prescription sent electronically to a different pharmacy, notify our office through [facility] or by phone at [phone]

## 2024-09-27 ENCOUNTER — Encounter: Payer: Self-pay | Admitting: Family Medicine

## 2024-09-30 ENCOUNTER — Other Ambulatory Visit (HOSPITAL_BASED_OUTPATIENT_CLINIC_OR_DEPARTMENT_OTHER): Payer: Self-pay

## 2024-09-30 ENCOUNTER — Other Ambulatory Visit: Payer: Self-pay

## 2024-09-30 ENCOUNTER — Other Ambulatory Visit (INDEPENDENT_AMBULATORY_CARE_PROVIDER_SITE_OTHER): Payer: Self-pay

## 2024-10-01 DIAGNOSIS — Z01419 Encounter for gynecological examination (general) (routine) without abnormal findings: Secondary | ICD-10-CM | POA: Diagnosis not present

## 2024-10-01 DIAGNOSIS — Z113 Encounter for screening for infections with a predominantly sexual mode of transmission: Secondary | ICD-10-CM | POA: Diagnosis not present

## 2024-10-01 DIAGNOSIS — Z01411 Encounter for gynecological examination (general) (routine) with abnormal findings: Secondary | ICD-10-CM | POA: Diagnosis not present

## 2024-10-01 DIAGNOSIS — F520 Hypoactive sexual desire disorder: Secondary | ICD-10-CM | POA: Diagnosis not present

## 2024-10-01 DIAGNOSIS — Z0142 Encounter for cervical smear to confirm findings of recent normal smear following initial abnormal smear: Secondary | ICD-10-CM | POA: Diagnosis not present

## 2024-10-01 DIAGNOSIS — Z1331 Encounter for screening for depression: Secondary | ICD-10-CM | POA: Diagnosis not present

## 2024-10-01 DIAGNOSIS — Z124 Encounter for screening for malignant neoplasm of cervix: Secondary | ICD-10-CM | POA: Diagnosis not present

## 2024-10-01 DIAGNOSIS — Z30431 Encounter for routine checking of intrauterine contraceptive device: Secondary | ICD-10-CM | POA: Diagnosis not present

## 2024-10-02 ENCOUNTER — Other Ambulatory Visit (HOSPITAL_COMMUNITY): Payer: Self-pay

## 2024-10-02 ENCOUNTER — Other Ambulatory Visit (HOSPITAL_BASED_OUTPATIENT_CLINIC_OR_DEPARTMENT_OTHER): Payer: Self-pay

## 2024-10-04 ENCOUNTER — Telehealth (INDEPENDENT_AMBULATORY_CARE_PROVIDER_SITE_OTHER): Payer: Self-pay

## 2024-10-04 ENCOUNTER — Other Ambulatory Visit (HOSPITAL_BASED_OUTPATIENT_CLINIC_OR_DEPARTMENT_OTHER): Payer: Self-pay

## 2024-10-04 ENCOUNTER — Other Ambulatory Visit (INDEPENDENT_AMBULATORY_CARE_PROVIDER_SITE_OTHER): Payer: Self-pay

## 2024-10-04 ENCOUNTER — Other Ambulatory Visit (HOSPITAL_COMMUNITY): Payer: Self-pay

## 2024-10-04 MED ORDER — MOMETASONE FUROATE 0.1 % EX CREA
TOPICAL_CREAM | CUTANEOUS | 5 refills | Status: AC
  Filled 2024-10-04: qty 15, 30d supply, fill #0
  Filled 2024-12-08: qty 15, 30d supply, fill #1

## 2024-10-04 NOTE — Telephone Encounter (Signed)
 Patient called in asking about a refill for her mometasone  medication. I let the patient know that I was going to reach out to Dr. Rojean CMA. Patient understood.    Talked with the CMA Timea and she stated she had just faxed the prescription over with 6 refills on it.   Called Patient and LVM letting her know the prescription was faxed over.

## 2024-10-04 NOTE — Telephone Encounter (Signed)
 I see where a previous note has been sent to [facility] [place].

## 2024-10-12 ENCOUNTER — Encounter: Payer: Self-pay | Admitting: Dermatology

## 2024-10-17 ENCOUNTER — Other Ambulatory Visit: Payer: Self-pay | Admitting: Family Medicine

## 2024-10-17 DIAGNOSIS — F418 Other specified anxiety disorders: Secondary | ICD-10-CM

## 2024-10-18 ENCOUNTER — Other Ambulatory Visit (HOSPITAL_BASED_OUTPATIENT_CLINIC_OR_DEPARTMENT_OTHER): Payer: Self-pay

## 2024-10-18 ENCOUNTER — Encounter: Payer: Self-pay | Admitting: Family Medicine

## 2024-10-18 ENCOUNTER — Other Ambulatory Visit (HOSPITAL_COMMUNITY): Payer: Self-pay

## 2024-10-18 MED ORDER — ESCITALOPRAM OXALATE 20 MG PO TABS
20.0000 mg | ORAL_TABLET | Freq: Every day | ORAL | 3 refills | Status: AC
  Filled 2024-10-18: qty 180, 180d supply, fill #0
  Filled 2024-10-18: qty 90, 90d supply, fill #0

## 2024-10-27 ENCOUNTER — Other Ambulatory Visit: Payer: Self-pay | Admitting: Family Medicine

## 2024-10-27 ENCOUNTER — Other Ambulatory Visit (HOSPITAL_BASED_OUTPATIENT_CLINIC_OR_DEPARTMENT_OTHER): Payer: Self-pay

## 2024-10-27 DIAGNOSIS — R5383 Other fatigue: Secondary | ICD-10-CM

## 2024-10-27 DIAGNOSIS — G479 Sleep disorder, unspecified: Secondary | ICD-10-CM

## 2024-10-27 MED ORDER — AMPHETAMINE-DEXTROAMPHETAMINE 10 MG PO TABS
10.0000 mg | ORAL_TABLET | Freq: Every day | ORAL | 0 refills | Status: DC
  Filled 2024-10-27: qty 30, 30d supply, fill #0

## 2024-11-06 ENCOUNTER — Encounter: Payer: Self-pay | Admitting: Dermatology

## 2024-12-02 ENCOUNTER — Encounter: Payer: Self-pay | Admitting: Family Medicine

## 2024-12-02 NOTE — Telephone Encounter (Signed)
 This referral has already been placed, and noted that pt prefers to see Dr. Landy. Referral order printed. Will call and see if this can be signed and faxed. If not, will call the  patient and [facility] office on the line together to provider verbal as requested by the [facility] office.

## 2024-12-02 NOTE — Telephone Encounter (Signed)
 Called Dr Darice Likens office at  [phone] and spoke to [facility] in referrals.  Was advised that they can take a note and once they review the on-board fax will call and let us  know that they got it.   Dm/cma

## 2024-12-02 NOTE — Telephone Encounter (Signed)
 Patient called in in regards to referral to to [facility] to psychiatry. Patient stated that this has been an issue for months  now. She spoke with the manager at [facility] and they told her that the best way to get this taken care of is for [place] and patient to call their office on [address] to give a verbal referral. [facility] is not sure what the issue is,but they did state that most of the referrals from [place] location isn't coming over to them.

## 2024-12-02 NOTE — Telephone Encounter (Signed)
 Calling them now and faxed referral order to [phone].   Dm/cma    .

## 2024-12-08 ENCOUNTER — Other Ambulatory Visit (HOSPITAL_COMMUNITY): Payer: Self-pay

## 2024-12-13 ENCOUNTER — Ambulatory Visit: Admitting: Family Medicine

## 2024-12-14 ENCOUNTER — Encounter: Payer: Self-pay | Admitting: Family Medicine

## 2024-12-14 ENCOUNTER — Encounter (HOSPITAL_BASED_OUTPATIENT_CLINIC_OR_DEPARTMENT_OTHER): Payer: Self-pay

## 2024-12-14 ENCOUNTER — Other Ambulatory Visit (HOSPITAL_BASED_OUTPATIENT_CLINIC_OR_DEPARTMENT_OTHER): Payer: Self-pay

## 2024-12-14 ENCOUNTER — Telehealth: Admitting: Family Medicine

## 2024-12-14 VITALS — Ht 68.0 in | Wt 135.0 lb

## 2024-12-14 DIAGNOSIS — F909 Attention-deficit hyperactivity disorder, unspecified type: Secondary | ICD-10-CM

## 2024-12-14 DIAGNOSIS — F331 Major depressive disorder, recurrent, moderate: Secondary | ICD-10-CM

## 2024-12-14 DIAGNOSIS — S62662A Nondisplaced fracture of distal phalanx of right middle finger, initial encounter for closed fracture: Secondary | ICD-10-CM

## 2024-12-14 MED ORDER — BUPROPION HCL 75 MG PO TABS
75.0000 mg | ORAL_TABLET | Freq: Every morning | ORAL | 2 refills | Status: AC
  Filled 2024-12-14: qty 30, 30d supply, fill #0

## 2024-12-14 MED ORDER — AMPHETAMINE-DEXTROAMPHETAMINE 10 MG PO TABS
10.0000 mg | ORAL_TABLET | Freq: Every day | ORAL | 0 refills | Status: AC
  Filled 2024-12-14: qty 30, 30d supply, fill #0

## 2024-12-14 NOTE — Assessment & Plan Note (Signed)
 Stable with intermittent use of Adderall 10 mg daily.

## 2024-12-15 ENCOUNTER — Other Ambulatory Visit (HOSPITAL_COMMUNITY): Payer: Self-pay

## 2024-12-15 ENCOUNTER — Telehealth

## 2024-12-15 ENCOUNTER — Other Ambulatory Visit (HOSPITAL_BASED_OUTPATIENT_CLINIC_OR_DEPARTMENT_OTHER): Payer: Self-pay

## 2024-12-15 DIAGNOSIS — J111 Influenza due to unidentified influenza virus with other respiratory manifestations: Secondary | ICD-10-CM

## 2024-12-16 ENCOUNTER — Other Ambulatory Visit (HOSPITAL_BASED_OUTPATIENT_CLINIC_OR_DEPARTMENT_OTHER): Payer: Self-pay

## 2024-12-16 MED ORDER — BENZONATATE 100 MG PO CAPS
100.0000 mg | ORAL_CAPSULE | Freq: Three times a day (TID) | ORAL | 0 refills | Status: AC | PRN
  Filled 2024-12-16: qty 30, 10d supply, fill #0

## 2024-12-16 MED ORDER — OSELTAMIVIR PHOSPHATE 75 MG PO CAPS
75.0000 mg | ORAL_CAPSULE | Freq: Two times a day (BID) | ORAL | 0 refills | Status: AC
  Filled 2024-12-16: qty 10, 5d supply, fill #0

## 2024-12-16 NOTE — Progress Notes (Signed)
 E visit for Flu like symptoms   We are sorry that you are not feeling well.  Here is how we plan to help! Based on what you have shared with me it looks like you may have Flu B.  Influenza or the flu is  an infection caused by a respiratory virus. The flu virus is highly contagious and persons who did not receive their yearly flu vaccination may catch the flu from close contact.  We have anti-viral medications to treat the viruses that cause this infection. They are not a cure and only shorten the course of the infection. These prescriptions are most effective when they are given within the first 2 days of flu symptoms. Antiviral medications are indicated if you have a high risk of complications from the flu. You should  also consider an antiviral medication if you are in close contact with someone who is at risk. These medications can help patients avoid complications from the flu but have side effects that you should know.   Possible side effects from Tamiflu  or oseltamivir  include nausea, vomiting, diarrhea, dizziness, headaches, eye redness, sleep problems or other respiratory symptoms. You should not take Tamiflu  if you have an allergy to oseltamivir  or any to the ingredients in Tamiflu .  Based upon your symptoms and potential risk factors I have prescribed Oseltamivir  (Tamiflu ).  It has been sent to your designated pharmacy.  You will take one 75 mg capsule orally twice a day for the next 5 days.   For nasal congestion, you may use an oral decongestant such as Mucinex  D or if you have glaucoma or high blood pressure use plain Mucinex .  Saline nasal spray or nasal drops can help and can safely be used as often as needed for congestion.  If you have a sore or scratchy throat, use a saltwater gargle-  to  teaspoon of salt dissolved in a 4-ounce to 8-ounce glass of warm water.  Gargle the solution for approximately 15-30 seconds and then spit.  It is important not to swallow the solution.   You can also use throat lozenges/cough drops and Chloraseptic spray to help with throat pain or discomfort.  Warm or cold liquids can also be helpful in relieving throat pain.  For headache, pain or general discomfort, you can use Ibuprofen  or Tylenol  as directed.   Some authorities believe that zinc sprays or the use of Echinacea may shorten the course of your symptoms.  I have prescribed the following medications to help lessen symptoms: I have prescribed Tessalon  Perles 100 mg. You may take 1-2 capsules every 8 hours as needed for cough  You are to isolate at home until you have been fever-free for at least 24 hours without a fever-reducing medication, and symptoms have been steadily improving for 24 hours.  If you must be around other household members who do not have symptoms, you need to make sure that both you and the family members are masking consistently with a high-quality mask.  If you note any worsening of symptoms despite treatment, please seek an in-person evaluation ASAP. If you note any significant shortness of breath or any chest pain, please seek ED evaluation. Please do not delay care!  ANYONE WHO HAS FLU SYMPTOMS SHOULD: Stay home. The flu is highly contagious and going out or to work exposes others! Be sure to drink plenty of fluids. Water is fine as well as fruit juices, sodas and electrolyte beverages. You may want to stay away from caffeine or alcohol.  If you are nauseated, try taking small sips of liquids. How do you know if you are getting enough fluid? Your urine should be a pale yellow or almost colorless. Get rest. Taking a steamy shower or using a humidifier may help nasal congestion and ease sore throat pain. Using a saline nasal spray works much the same way. Cough drops, hard candies and sore throat lozenges may ease your cough. Line up a caregiver. Have someone check on you regularly.  GET HELP RIGHT AWAY IF: You cannot keep down liquids or your  medications. You become short of breath Your fell like you are going to pass out or loose consciousness. Your symptoms persist after you have completed your treatment plan  MAKE SURE YOU  Understand these instructions. Will watch your condition. Will get help right away if you are not doing well or get worse.  Your e-visit answers were reviewed by a board certified advanced clinical practitioner to complete your personal care plan.  Depending on the condition, your plan could have included both over the counter or prescription medications.  If there is a problem please reply  once you have received a response from your provider.  Your safety is important to us .  If you have drug allergies check your prescription carefully.    You can use MyChart to ask questions about todays visit, request a non-urgent call back, or ask for a work or school excuse for 24 hours related to this e-Visit. If it has been greater than 24 hours you will need to follow up with your provider, or enter a new e-Visit to address those concerns.  You will get an e-mail in the next two days asking about your experience.  I hope that your e-visit has been valuable and will speed your recovery. Thank you for using e-visits.   I have spent 5 minutes in review of e-visit questionnaire, review and updating patient chart, medical decision making and response to patient.   Elsie Velma Lunger, PA-C

## 2025-03-22 ENCOUNTER — Ambulatory Visit: Admitting: Family Medicine

## 2025-03-31 ENCOUNTER — Ambulatory Visit: Admitting: Dermatology

## 2025-04-12 ENCOUNTER — Ambulatory Visit: Admitting: Dermatology
